# Patient Record
Sex: Female | Born: 1955
Health system: Southern US, Community
[De-identification: ages and names within clinical notes are randomized; demographics above are authoritative.]

## PROBLEM LIST (undated history)

## (undated) DIAGNOSIS — F419 Anxiety disorder, unspecified: Secondary | ICD-10-CM

## (undated) DIAGNOSIS — G43909 Migraine, unspecified, not intractable, without status migrainosus: Secondary | ICD-10-CM

## (undated) DIAGNOSIS — I10 Essential (primary) hypertension: Secondary | ICD-10-CM

## (undated) DIAGNOSIS — T7840XA Allergy, unspecified, initial encounter: Secondary | ICD-10-CM

## (undated) DIAGNOSIS — N6320 Unspecified lump in the left breast, unspecified quadrant: Secondary | ICD-10-CM

## (undated) DIAGNOSIS — E785 Hyperlipidemia, unspecified: Secondary | ICD-10-CM

## (undated) HISTORY — DX: Allergy, unspecified, initial encounter: T78.40XA

## (undated) HISTORY — DX: Essential (primary) hypertension: I10

## (undated) HISTORY — PX: TONSILLECTOMY: SUR1361

## (undated) HISTORY — DX: Hyperlipidemia, unspecified: E78.5

## (undated) HISTORY — PX: OTHER SURGICAL HISTORY: SHX169

## (undated) HISTORY — DX: Migraine, unspecified, not intractable, without status migrainosus: G43.909

---

## 1998-08-29 ENCOUNTER — Ambulatory Visit (HOSPITAL_COMMUNITY): Admission: RE | Admit: 1998-08-29 | Discharge: 1998-08-29 | Payer: Self-pay | Admitting: Otolaryngology

## 1998-08-29 ENCOUNTER — Encounter: Payer: Self-pay | Admitting: Otolaryngology

## 1999-10-18 ENCOUNTER — Other Ambulatory Visit: Admission: RE | Admit: 1999-10-18 | Discharge: 1999-10-18 | Payer: Self-pay | Admitting: Family Medicine

## 2000-03-19 ENCOUNTER — Other Ambulatory Visit: Admission: RE | Admit: 2000-03-19 | Discharge: 2000-03-19 | Payer: Self-pay | Admitting: Obstetrics & Gynecology

## 2000-10-16 ENCOUNTER — Other Ambulatory Visit: Admission: RE | Admit: 2000-10-16 | Discharge: 2000-10-16 | Payer: Self-pay | Admitting: Obstetrics & Gynecology

## 2002-03-20 ENCOUNTER — Other Ambulatory Visit: Admission: RE | Admit: 2002-03-20 | Discharge: 2002-03-20 | Payer: Self-pay | Admitting: Obstetrics & Gynecology

## 2003-03-31 ENCOUNTER — Other Ambulatory Visit: Admission: RE | Admit: 2003-03-31 | Discharge: 2003-03-31 | Payer: Self-pay | Admitting: Obstetrics & Gynecology

## 2004-05-19 ENCOUNTER — Other Ambulatory Visit: Admission: RE | Admit: 2004-05-19 | Discharge: 2004-05-19 | Payer: Self-pay | Admitting: Obstetrics & Gynecology

## 2004-07-03 ENCOUNTER — Ambulatory Visit (HOSPITAL_COMMUNITY): Admission: RE | Admit: 2004-07-03 | Discharge: 2004-07-03 | Payer: Self-pay | Admitting: Otolaryngology

## 2004-07-14 ENCOUNTER — Ambulatory Visit (HOSPITAL_COMMUNITY): Admission: RE | Admit: 2004-07-14 | Discharge: 2004-07-14 | Payer: Self-pay | Admitting: Otolaryngology

## 2004-07-14 ENCOUNTER — Encounter (INDEPENDENT_AMBULATORY_CARE_PROVIDER_SITE_OTHER): Payer: Self-pay | Admitting: *Deleted

## 2005-04-03 ENCOUNTER — Encounter: Admission: RE | Admit: 2005-04-03 | Discharge: 2005-04-03 | Payer: Self-pay | Admitting: Otolaryngology

## 2005-08-09 ENCOUNTER — Other Ambulatory Visit: Admission: RE | Admit: 2005-08-09 | Discharge: 2005-08-09 | Payer: Self-pay | Admitting: Obstetrics & Gynecology

## 2007-05-26 ENCOUNTER — Ambulatory Visit: Payer: Self-pay | Admitting: Internal Medicine

## 2007-05-26 DIAGNOSIS — J309 Allergic rhinitis, unspecified: Secondary | ICD-10-CM

## 2007-09-11 ENCOUNTER — Telehealth (INDEPENDENT_AMBULATORY_CARE_PROVIDER_SITE_OTHER): Payer: Self-pay | Admitting: *Deleted

## 2007-09-22 ENCOUNTER — Ambulatory Visit: Payer: Self-pay | Admitting: Internal Medicine

## 2007-09-22 DIAGNOSIS — G43909 Migraine, unspecified, not intractable, without status migrainosus: Secondary | ICD-10-CM | POA: Insufficient documentation

## 2007-09-22 DIAGNOSIS — I10 Essential (primary) hypertension: Secondary | ICD-10-CM | POA: Insufficient documentation

## 2007-10-09 HISTORY — PX: COLONOSCOPY: SHX174

## 2008-07-05 ENCOUNTER — Telehealth (INDEPENDENT_AMBULATORY_CARE_PROVIDER_SITE_OTHER): Payer: Self-pay | Admitting: *Deleted

## 2008-07-27 ENCOUNTER — Telehealth (INDEPENDENT_AMBULATORY_CARE_PROVIDER_SITE_OTHER): Payer: Self-pay | Admitting: *Deleted

## 2008-08-02 ENCOUNTER — Ambulatory Visit: Payer: Self-pay | Admitting: Internal Medicine

## 2008-08-03 ENCOUNTER — Encounter: Payer: Self-pay | Admitting: Internal Medicine

## 2008-08-06 ENCOUNTER — Telehealth (INDEPENDENT_AMBULATORY_CARE_PROVIDER_SITE_OTHER): Payer: Self-pay | Admitting: *Deleted

## 2008-08-13 ENCOUNTER — Ambulatory Visit: Payer: Self-pay | Admitting: Internal Medicine

## 2008-08-13 DIAGNOSIS — E785 Hyperlipidemia, unspecified: Secondary | ICD-10-CM

## 2008-08-13 LAB — CONVERTED CEMR LAB
ALT: 30 units/L (ref 0–35)
AST: 21 units/L (ref 0–37)
Albumin: 4 g/dL (ref 3.5–5.2)
BUN: 11 mg/dL (ref 6–23)
Basophils Relative: 1.1 % (ref 0.0–3.0)
CO2: 25 meq/L (ref 19–32)
Chloride: 108 meq/L (ref 96–112)
Creatinine, Ser: 0.7 mg/dL (ref 0.4–1.2)
Direct LDL: 131.1 mg/dL
Eosinophils Relative: 3.6 % (ref 0.0–5.0)
Glucose, Bld: 89 mg/dL (ref 70–99)
HDL goal, serum: 40 mg/dL
HDL: 42.8 mg/dL (ref >39.0)
Hemoglobin: 13.9 g/dL (ref 12.0–15.0)
Lymphocytes Relative: 26.3 % (ref 12.0–46.0)
Neutro Abs: 3.9 10*3/uL (ref 1.4–7.7)
Neutrophils Relative %: 61.4 % (ref 43.0–77.0)
RBC: 3.92 M/uL (ref 3.87–5.11)
Total Bilirubin: 0.9 mg/dL (ref 0.3–1.2)
Total Protein: 7.2 g/dL (ref 6.0–8.3)
VLDL: 22 mg/dL (ref 0–40)
WBC: 6.4 10*3/uL (ref 4.5–10.5)

## 2008-08-30 ENCOUNTER — Telehealth (INDEPENDENT_AMBULATORY_CARE_PROVIDER_SITE_OTHER): Payer: Self-pay | Admitting: *Deleted

## 2008-09-06 ENCOUNTER — Telehealth (INDEPENDENT_AMBULATORY_CARE_PROVIDER_SITE_OTHER): Payer: Self-pay | Admitting: *Deleted

## 2008-09-06 ENCOUNTER — Encounter: Payer: Self-pay | Admitting: Internal Medicine

## 2008-09-10 ENCOUNTER — Ambulatory Visit: Payer: Self-pay | Admitting: Internal Medicine

## 2008-09-24 ENCOUNTER — Ambulatory Visit: Payer: Self-pay | Admitting: Internal Medicine

## 2008-09-24 LAB — HM COLONOSCOPY: HM Colonoscopy: NORMAL

## 2008-11-19 ENCOUNTER — Encounter: Admission: RE | Admit: 2008-11-19 | Discharge: 2008-11-19 | Payer: Self-pay | Admitting: Obstetrics & Gynecology

## 2009-01-10 ENCOUNTER — Telehealth (INDEPENDENT_AMBULATORY_CARE_PROVIDER_SITE_OTHER): Payer: Self-pay | Admitting: *Deleted

## 2009-04-05 ENCOUNTER — Encounter: Admission: RE | Admit: 2009-04-05 | Discharge: 2009-04-05 | Payer: Self-pay | Admitting: Obstetrics & Gynecology

## 2009-08-15 ENCOUNTER — Ambulatory Visit: Payer: Self-pay | Admitting: Internal Medicine

## 2009-08-15 DIAGNOSIS — R5383 Other fatigue: Secondary | ICD-10-CM

## 2009-08-15 DIAGNOSIS — R5381 Other malaise: Secondary | ICD-10-CM

## 2009-08-15 DIAGNOSIS — J45909 Unspecified asthma, uncomplicated: Secondary | ICD-10-CM

## 2009-12-05 ENCOUNTER — Ambulatory Visit: Payer: Self-pay | Admitting: Internal Medicine

## 2009-12-19 ENCOUNTER — Ambulatory Visit: Payer: Self-pay | Admitting: Family

## 2009-12-23 ENCOUNTER — Telehealth: Payer: Self-pay | Admitting: Family

## 2009-12-23 ENCOUNTER — Encounter (INDEPENDENT_AMBULATORY_CARE_PROVIDER_SITE_OTHER): Payer: Self-pay | Admitting: *Deleted

## 2010-04-19 ENCOUNTER — Encounter: Admission: RE | Admit: 2010-04-19 | Discharge: 2010-04-19 | Payer: Self-pay | Admitting: Obstetrics & Gynecology

## 2010-09-14 ENCOUNTER — Telehealth (INDEPENDENT_AMBULATORY_CARE_PROVIDER_SITE_OTHER): Payer: Self-pay | Admitting: *Deleted

## 2010-10-04 ENCOUNTER — Ambulatory Visit: Payer: Self-pay | Admitting: Internal Medicine

## 2010-10-05 ENCOUNTER — Encounter: Payer: Self-pay | Admitting: Internal Medicine

## 2010-10-05 LAB — CONVERTED CEMR LAB: BUN: 14 mg/dL (ref 6–23)

## 2010-11-07 NOTE — Assessment & Plan Note (Signed)
Summary: BURNED RT HAND ON WOOD STOVE/RH.....   Vital Signs:  Patient profile:   55 year old female Height:      61.5 inches Weight:      133 pounds Temp:     99 degrees F BP sitting:   110 / 80  Vitals Entered By: [name] (December 05, 2009 4:27 PM) CC: burn on rt hand 2 weeks ago on wood stove, Is Patient Diabetic? No   History of Present Illness: as above burn is healing but still hurt some, patient not sure if needs to do something else   Current Medications (verified): 1)  Nasonex 50 Mcg/act  Susp (Mometasone Furoate) .... Use As Directed 2)  Estradiol-Norethindrone Acet 1-0.5 Mg Tabs (Estradiol-Norethindrone Acet) .Marland Kitchen.. 1 By Mouth Once Daily 3)  Cozaar 100 Mg Tabs (Losartan Potassium) .Marland Kitchen.. 1 By Mouth Once Daily 4)  Gabapentin 100 Mg Caps (Gabapentin) .Marland Kitchen.. 1 -2 Q 8 Hrs Prn 5)  Zyrtec-D Allergy & Congestion 5-120 Mg Xr12h-Tab (Cetirizine-Pseudoephedrine) .... 1/2 By Mouth Once Daily 6)  Alvesco 160 Mcg/act Aers (Ciclesonide) .... Once Daily  Allergies (verified): 1)  Sulfa  Past History:  Past Medical History: Reviewed history from 08/15/2009 and no changes required. Allergic rhinitis Hypertension G 0 P0 ; Migraines Hyperlipidemia: LDL 131 in 2009 Asthma/ RAD  variant as cough  Past Surgical History: Reviewed history from 08/15/2009 and no changes required. Tonsillectomy Thyroid cyst aspirated; Colonoscopy negative ,due 2019  Review of Systems       no fever, no d/c  Physical Exam  General:  alert and well-developed.   Extremities:  dorsum of the R hand has a oval, 1x2 cn wound, margins are healing well, center slt red w/o d/c    Impression & Recommendations:  Problem # 1:  BURN, HAND (ICD-944.00)  keep clean and cover when she goes to work put hydrocortisone OTC at night call if no better soon  Complete Medication List: 1)  Nasonex 50 Mcg/act Susp (Mometasone furoate) .... Use as directed 2)  Estradiol-norethindrone Acet 1-0.5 Mg Tabs  (Estradiol-norethindrone acet) .Marland Kitchen.. 1 by mouth once daily 3)  Cozaar 100 Mg Tabs (Losartan potassium) .Marland Kitchen.. 1 by mouth once daily 4)  Gabapentin 100 Mg Caps (Gabapentin) .Marland Kitchen.. 1 -2 q 8 hrs prn 5)  Zyrtec-d Allergy & Congestion 5-120 Mg Xr12h-tab (Cetirizine-pseudoephedrine) .... 1/2 by mouth once daily 6)  Alvesco 160 Mcg/act Aers (Ciclesonide) .... Once daily

## 2010-11-07 NOTE — Miscellaneous (Signed)
  Clinical Lists Changes  Allergies: Added new allergy or adverse reaction of CODEINE     Current Allergies: ! CODEINE SULFA

## 2010-11-07 NOTE — Assessment & Plan Note (Signed)
Summary: bad congestion//lch   Vital Signs:  Patient profile:   55 year old female Weight:      131.50 pounds BMI:     24.53 O2 Sat:      96 % on Room air Temp:     98.1 degrees F oral Pulse rate:   83 / minute Pulse rhythm:   regular Resp:     12 per minute BP sitting:   114 / 78  (right arm) Cuff size:   regular  Vitals Entered By: Mervin Kung CMA (December 19, 2009 4:24 PM)  O2 Flow:  Room air \\CC : room 4  Head congestion since Thursday.  Has productive green cough in the a.m.  Right ear pain and fullness x 2 days., Lipid Management   CC:  room 4  Head congestion since Thursday.  Has productive green cough in the a.m.  Right ear pain and fullness x 2 days. and Lipid Management.  History of Present Illness: Samantha Shepard is a 55 year old female who presents today with c/o "cold" now involving ears/chest/voice hoarseness.  Also has some associated sinus and "tooth" pain.  + cough.  Both sinus d/c and phlegm range from yellow to clear in color.  Notes that these symptoms started on Thursday, thinks she may have had some mild subjective fever.  Has used mucinex- DM, which has not helped her symptoms.  Trouble sleeping at night due to cough.  + sick contacts at work.    Lipid Management History:      Positive NCEP/ATP III risk factors include female age 105 years old or older and hypertension.  Negative NCEP/ATP III risk factors include non-tobacco-user status.     Allergies: 1)  Sulfa  Physical Exam  General:  Well-developed,well-nourished,in no acute distress; alert,appropriate and cooperative throughout examination Head:  Normocephalic and atraumatic without obvious abnormalities. No apparent alopecia or balding. Eyes:  PERRLA Ears:  serous effusion right ear. Mouth:  Oral mucosa and oropharynx without lesions or exudates.  Teeth in good repair. Neck:  No deformities, masses, or tenderness noted. Lungs:  Normal respiratory effort, chest expands symmetrically. Lungs are clear  to auscultation, no crackles or wheezes. Heart:  Normal rate and regular rhythm. S1 and S2 normal without gallop, murmur, click, rub or other extra sounds.   Impression & Recommendations:  Problem # 1:  SINUSITIS (ICD-473.9) Will plan to treat sinus and mild bronchitis with amoxicillin. Add Tussionex as needed for cough.   Her updated medication list for this problem includes:    Nasonex 50 Mcg/act Susp (Mometasone furoate) ..... Use as directed    Zyrtec-d Allergy & Congestion 5-120 Mg Xr12h-tab (Cetirizine-pseudoephedrine) .Marland Kitchen... 1/2 by mouth once daily    Mucinex Dm 30-600 Mg Xr12h-tab (Dextromethorphan-guaifenesin) .Marland Kitchen... Take 1 tablet by mouth two times a day    Amoxicillin 500 Mg Cap (Amoxicillin) .Marland Kitchen... Take 1 capsule by mouth three times a day x 10 days    Tussionex Pennkinetic Er 8-10 Mg/36ml Lqcr (Chlorpheniramine-hydrocodone) .Marland KitchenMarland KitchenMarland KitchenMarland Kitchen 5 ml by mouth every 12 hours as needed for cough.  Problem # 2:  BRONCHITIS, MILD (ICD-490)  Her updated medication list for this problem includes:    Zyrtec-d Allergy & Congestion 5-120 Mg Xr12h-tab (Cetirizine-pseudoephedrine) .Marland Kitchen... 1/2 by mouth once daily    Alvesco 160 Mcg/act Aers (Ciclesonide) .Marland Kitchen... As needed    Mucinex Dm 30-600 Mg Xr12h-tab (Dextromethorphan-guaifenesin) .Marland Kitchen... Take 1 tablet by mouth two times a day    Amoxicillin 500 Mg Cap (Amoxicillin) .Marland Kitchen... Take 1 capsule by  mouth three times a day x 10 days    Tussionex Pennkinetic Er 8-10 Mg/72ml Lqcr (Chlorpheniramine-hydrocodone) .Marland KitchenMarland KitchenMarland KitchenMarland Kitchen 5 ml by mouth every 12 hours as needed for cough.  Complete Medication List: 1)  Nasonex 50 Mcg/act Susp (Mometasone furoate) .... Use as directed 2)  Estradiol-norethindrone Acet 1-0.5 Mg Tabs (Estradiol-norethindrone acet) .Marland Kitchen.. 1 by mouth every other day. 3)  Cozaar 100 Mg Tabs (Losartan potassium) .Marland Kitchen.. 1 by mouth once daily 4)  Gabapentin 100 Mg Caps (Gabapentin) .Marland Kitchen.. 1 -2 q 8 hrs prn 5)  Zyrtec-d Allergy & Congestion 5-120 Mg Xr12h-tab  (Cetirizine-pseudoephedrine) .... 1/2 by mouth once daily 6)  Alvesco 160 Mcg/act Aers (Ciclesonide) .... As needed 7)  Mucinex Dm 30-600 Mg Xr12h-tab (Dextromethorphan-guaifenesin) .... Take 1 tablet by mouth two times a day 8)  Amoxicillin 500 Mg Cap (Amoxicillin) .... Take 1 capsule by mouth three times a day x 10 days 9)  Tussionex Pennkinetic Er 8-10 Mg/37ml Lqcr (Chlorpheniramine-hydrocodone) .... 5 ml by mouth every 12 hours as needed for cough.  Lipid Assessment/Plan:      Based on NCEP/ATP III, the patient's risk factor category is "2 or more risk factors and a calculated 10 year CAD risk of > 20%".  The patient's lipid goals are as follows: Total cholesterol goal is 200; LDL cholesterol goal is 160; HDL cholesterol goal is 40; Triglyceride goal is 150.    Patient Instructions: 1)  Call if you develop fever over 101, increasing sinus pressure, pain with eye movement, increased facial tenderness of swelling, worsening cough, or if you develop visual changes. Prescriptions: TUSSIONEX PENNKINETIC ER 8-10 MG/5ML LQCR (CHLORPHENIRAMINE-HYDROCODONE) 5 ml by mouth every 12 hours as needed for cough.  #150 x 0   Entered and Authorized by:   Lemont Fillers FNP   Signed by:   Lemont Fillers FNP on 12/19/2009   Method used:   Print then Give to Patient   RxID:   [id number] AMOXICILLIN 500 MG CAP (AMOXICILLIN) Take 1 capsule by mouth three times a day X 10 days  #30 x 0   Entered and Authorized by:   Lemont Fillers FNP   Signed by:   Lemont Fillers FNP on 12/19/2009   Method used:   Electronically to        [facility] (retail)       [address]       [place], [place]  [address]       Ph: [phone] or [phone]       Fax: [phone]   RxID:   [id number]   Current Allergies (reviewed today): SULFA

## 2010-11-07 NOTE — Progress Notes (Signed)
Summary: Probs with cough med.  Phone Note Call from Patient   Caller: Patient Details for Reason: Medication  for cough Summary of Call: Patient was seen   Monday given rx  for tussionex for her cough , pt can not take tussionex keeps her up all night .  Please call pt   Cell  [phone]    Initial call taken by: Darral Dash,  December 23, 2009 8:17 AM  Follow-up for Phone Call        Pls call patient and if aggreeable switch to: cheratussin 1-2 teaspoons by mouth every 6 hours as needed, #237ml, 0 refills  Otherwise she should use Robitussin Follow-up by: Lemont Fillers FNP,  December 23, 2009 9:05 AM  Additional Follow-up for Phone Call Additional follow up Details #1::        Pt states she thinks the codeine is keeping her awake. She states she would just take the Robitussin. Additional Follow-up by: Mervin Kung CMA,  December 23, 2009 9:21 AM

## 2010-11-07 NOTE — Progress Notes (Signed)
Summary: Losartan refill  Phone Note Refill Request Message from:  Fax from Pharmacy on September 14, 2010 12:27 PM  Refills Requested: Medication #1:  COZAAR 100 MG TABS 1 by mouth once daily   Last Refilled: 03/06/2010 [id number], [address], [place], [place]   phone-[phone]   [phone]   qty = 90  Next Appointment Scheduled: Wed 10/04/2010 Initial call taken by: Jerolyn Shin,  September 14, 2010 12:28 PM    Prescriptions: COZAAR 100 MG TABS (LOSARTAN POTASSIUM) 1 by mouth once daily  #90 x 0   Entered by:   Shonna Chock CMA   Authorized by:   Marga Melnick MD   Signed by:   Shonna Chock CMA on 09/14/2010   Method used:   Electronically to        [facility] (retail)       [address]       [place], [place]  [address]       Ph: [phone] or [phone]       Fax: [phone]   RxID:   [id number]

## 2010-11-09 NOTE — Letter (Signed)
Summary: [facility]  [facility]   Imported By: Lanelle Bal 10/26/2010 11:32:26  _____________________________________________________________________  External Attachment:    Type:   Image     Comment:   External Document

## 2010-11-09 NOTE — Assessment & Plan Note (Signed)
Summary: med refill//kn   Vital Signs:  Patient profile:   55 year old female Height:      62.25 inches Weight:      133.6 pounds BMI:     24.33 Temp:     98.4 degrees F oral Pulse rate:   72 / minute Resp:     14 per minute BP sitting:   124 / 80  (left arm) Cuff size:   regular  Vitals Entered By: Shonna Chock CMA (October 04, 2010 11:05 AM) CC: Refill B/P med, patient states " Not a physical", Lipid Management Comments Height recorded with shoes on Patient informed TD vaccine due   CC:  Refill B/P med, patient states " Not a physical", and Lipid Management.  History of Present Illness: Hypertension Follow-Up      This is a 55 year old woman who presents for Hypertension follow-up.  The patient denies lightheadedness, urinary frequency, headaches, edema, and  significant fatigue.  The patient denies the following associated symptoms: chest pain, chest pressure, exercise intolerance, dyspnea, palpitations, and syncope.  Compliance with medications (by patient report) has been near 100%.  The patient reports that dietary compliance has been good.  The patient reports exercising 2-3 X per week.  Adjunctive measures currently used by the patient include modified  salt restriction.  BP averages < 135/85 .  Lipid Management History:      Positive NCEP/ATP III risk factors include female age 57 years old or older and hypertension.  Negative NCEP/ATP III risk factors include no history of early menopause without estrogen hormone replacement, non-diabetic, no family history for ischemic heart disease, non-tobacco-user status, no ASHD (atherosclerotic heart disease), no prior stroke/TIA, no peripheral vascular disease, and no history of aortic aneurysm.     Current Medications (verified): 1)  Estradiol-Norethindrone Acet 1-0.5 Mg Tabs (Estradiol-Norethindrone Acet) .Marland Kitchen.. 1 By Mouth Every Other Day. 2)  Cozaar 100 Mg Tabs (Losartan Potassium) .Marland Kitchen.. 1 By Mouth Once Daily 3)  Gabapentin 100 Mg  Caps (Gabapentin) .Marland Kitchen.. 1 -2 Q 8 Hrs Prn 4)  Zyrtec-D Allergy & Congestion 5-120 Mg Xr12h-Tab (Cetirizine-Pseudoephedrine) .... 1/2 By Mouth Once Daily  Allergies: 1)  ! Codeine 2)  Sulfa  Past History:  Past Medical History: Allergic rhinitis Hypertension G 0 P0 ; Migraines Hyperlipidemia: LDL 131, HDL 43  in 2009.[place] Study LDL goal = < 130. No FH of premature CAD/MI Asthma/ RAD  variant as cough  Past Surgical History: Tonsillectomy Thyroid cyst aspirated; Colonoscopy negative 2009 ,due 2019  Review of Systems Allergy:  Complains of itching eyes and sneezing; Nasonex Rxed by Dr Beaulah Dinning .  Physical Exam  General:  well-nourished;alert,appropriate and cooperative throughout examination Lungs:  Normal respiratory effort, chest expands symmetrically. Lungs are clear to auscultation, no crackles or wheezes. Heart:  Normal rate and regular rhythm. S1 and S2 normal without gallop, murmur, click, rub.S4 with  slight slurring Abdomen:  Bowel sounds positive,abdomen soft and non-tender without masses, organomegaly or hernias noted. No AAA or bruits Pulses:  R and L carotid,radial,dorsalis pedis and posterior tibial pulses are full and equal bilaterally Extremities:  No clubbing, cyanosis, edema. Psych:  memory intact for recent and remote, normally interactive, and good eye contact.     Impression & Recommendations:  Problem # 1:  HYPERTENSION, ESSENTIAL NOS (ICD-401.9)  controlled Her updated medication list for this problem includes:    Cozaar 100 Mg Tabs (Losartan potassium) .Marland Kitchen... 1 by mouth once daily  Orders: Venipuncture (16109) TLB-Creatinine, Blood (82565-CREA) TLB-Potassium (K+) (  84132-K) TLB-BUN (Urea Nitrogen) (84520-BUN)  Problem # 2:  HYPERLIPIDEMIA (ICD-272.4) LDL goal = < 130  Complete Medication List: 1)  Estradiol-norethindrone Acet 1-0.5 Mg Tabs (Estradiol-norethindrone acet) .Marland Kitchen.. 1 by mouth every other day. 2)  Cozaar 100 Mg Tabs (Losartan  potassium) .Marland Kitchen.. 1 by mouth once daily 3)  Gabapentin 100 Mg Caps (Gabapentin) .Marland Kitchen.. 1 -2 q 8 hrs prn 4)  Zyrtec-d Allergy & Congestion 5-120 Mg Xr12h-tab (Cetirizine-pseudoephedrine) .... 1/2 by mouth once daily  Lipid Assessment/Plan:      Based on NCEP/ATP III, the patient's risk factor category is "2 or more risk factors and a calculated 10 year CAD risk of > 20%".  The patient's lipid goals are as follows: Total cholesterol goal is 200; LDL cholesterol goal is 160; HDL cholesterol goal is 40; Triglyceride goal is 150.    Patient Instructions: 1)  Consider monitor of fasting lipids; LDL goal = < 130. 2)  It is important that you exercise regularly at least 20 minutes 5 times a week. If you develop chest pain, have severe difficulty breathing, or feel very tired , stop exercising immediately and seek medical attention. 3)  Take an  57 mg coated Aspirin every day. 4)  Check your Blood Pressure regularly. If it is above: < 135/85 ON AVERAGE  you should make an appointment. Avoid decongestants. Prescriptions: COZAAR 100 MG TABS (LOSARTAN POTASSIUM) 1 by mouth once daily  #90 x 3   Entered and Authorized by:   Marga Melnick MD   Signed by:   Marga Melnick MD on 10/04/2010   Method used:   Print then Give to Patient   RxID:   [id number]    Orders Added: 1)  Est. Patient Level III [14782] 2)  Venipuncture [95621] 3)  TLB-Creatinine, Blood [82565-CREA] 4)  TLB-Potassium (K+) [84132-K] 5)  TLB-BUN (Urea Nitrogen) [84520-BUN]

## 2010-12-01 ENCOUNTER — Encounter: Payer: Self-pay | Admitting: Internal Medicine

## 2010-12-01 ENCOUNTER — Ambulatory Visit (INDEPENDENT_AMBULATORY_CARE_PROVIDER_SITE_OTHER): Payer: BC Managed Care – PPO | Admitting: Internal Medicine

## 2010-12-01 DIAGNOSIS — J069 Acute upper respiratory infection, unspecified: Secondary | ICD-10-CM

## 2010-12-01 DIAGNOSIS — R05 Cough: Secondary | ICD-10-CM

## 2010-12-01 DIAGNOSIS — J309 Allergic rhinitis, unspecified: Secondary | ICD-10-CM

## 2010-12-05 NOTE — Assessment & Plan Note (Signed)
Summary: SINUS PAIN/RH.....   Vital Signs:  Patient profile:   55 year old female Weight:      134.8 pounds BMI:     24.55 Temp:     98.6 degrees F oral Pulse rate:   80 / minute Resp:     15 per minute BP sitting:   122 / 70  (left arm) Cuff size:   large  Vitals Entered By: Shonna Chock CMA (December 01, 2010 4:36 PM) CC: Sinus pain, URI symptoms   CC:  Sinus pain and URI symptoms.  History of Present Illness:    Onset 11/21/2010 as dizziness  & malaise; she does not take the Flu shot. She now  reports nasal congestion, dry cough, and R  earache, but denies purulent nasal discharge.  Associated symptoms include dyspnea.  The patient denies fever and wheezing.  The patient also reports frontal  headache.  Risk factors for Strep sinusitis include bilateral facial pain and tooth pain.  The patient denies the following risk factors for Strep sinusitis: tender adenopathy.   Rx: Nasonex, Asmanex   Current Medications (verified): 1)  Cozaar 100 Mg Tabs (Losartan Potassium) .Marland Kitchen.. 1 By Mouth Once Daily 2)  Gabapentin 100 Mg Caps (Gabapentin) .Marland Kitchen.. 1 -2 Q 8 Hrs Prn 3)  Zyrtec Allergy 10 Mg Tabs (Cetirizine Hcl) .Marland Kitchen.. 1 By Mouth Once Daily 4)  Asmanex 60 Metered Doses 220 Mcg/inh Aepb (Mometasone Furoate) .... Once Daily 5)  Nasonex 50 Mcg/act Susp (Mometasone Furoate) .... As Directed  Allergies: 1)  ! Codeine 2)  Sulfa  Review of Systems Allergy:  Complains of itching eyes; denies sneezing.  Physical Exam  General:  well-nourished,in no acute distress; alert,appropriate and cooperative throughout examination Ears:  External ear exam shows no significant lesions or deformities.  Otoscopic examination reveals clear canals, tympanic membranes are intact bilaterally without bulging, retraction, inflammation or discharge. Hearing is grossly normal bilaterally. Nose:  External nasal examination shows no deformity or inflammation. Nasal mucosa are  dry  without lesions or exudates. Mouth:   Oral mucosa and oropharynx without lesions or exudates.  Teeth in good repair. No pharyngeal erythema.   Lungs:  Normal respiratory effort, chest expands symmetrically. Lungs are clear to auscultation, no crackles or wheezes. Cervical Nodes:  No lymphadenopathy noted Axillary Nodes:  No palpable lymphadenopathy   Impression & Recommendations:  Problem # 1:  URI (ICD-465.9) Extrinsic (allergic)  vs subclinical deep seated rhinosinusitis  Her updated medication list for this problem includes:    Zyrtec Allergy 10 Mg Tabs (Cetirizine hcl) .Marland Kitchen... 1 by mouth once daily  Problem # 2:  COUGH (ICD-786.2) extrinsic   Problem # 3:  ALLERGIC RHINITIS (ICD-477.9) PMH of  Her updated medication list for this problem includes:    Zyrtec Allergy 10 Mg Tabs (Cetirizine hcl) .Marland Kitchen... 1 by mouth once daily    Nasonex 50 Mcg/act Susp (Mometasone furoate) .Marland Kitchen... As directed  Complete Medication List: 1)  Cozaar 100 Mg Tabs (Losartan potassium) .Marland Kitchen.. 1 by mouth once daily 2)  Gabapentin 100 Mg Caps (Gabapentin) .Marland Kitchen.. 1 -2 q 8 hrs prn 3)  Zyrtec Allergy 10 Mg Tabs (Cetirizine hcl) .Marland Kitchen.. 1 by mouth once daily 4)  Asmanex 60 Metered Doses 220 Mcg/inh Aepb (Mometasone furoate) .... Once daily 5)  Nasonex 50 Mcg/act Susp (Mometasone furoate) .... As directed 6)  Singulair 10 Mg Tabs (Montelukast sodium) .Marland Kitchen.. 1 once daily 7)  Amoxicillin 500 Mg Caps (Amoxicillin) .Marland Kitchen.. 1 three times a day  Patient Instructions: 1)  Neti pot once daily two times a day as needed followed by Nasonex as crossover technique.  Fill the Amoxicillin Rx if you have "pain , pus , & fever"  Prescriptions: AMOXICILLIN 500 MG CAPS (AMOXICILLIN) 1 three times a day  #30 x 0   Entered and Authorized by:   Marga Melnick MD   Signed by:   Marga Melnick MD on 12/01/2010   Method used:   Print then Give to Patient   RxID:   [id number] SINGULAIR 10 MG TABS (MONTELUKAST SODIUM) 1 once daily  #30 x 5   Entered and Authorized by:   Marga Melnick MD   Signed by:   Marga Melnick MD on 12/01/2010   Method used:   Print then Give to Patient   RxID:   [id number]    Orders Added: 1)  Est. Patient Level III [02725]

## 2011-04-04 ENCOUNTER — Ambulatory Visit (INDEPENDENT_AMBULATORY_CARE_PROVIDER_SITE_OTHER): Payer: BC Managed Care – PPO | Admitting: Internal Medicine

## 2011-04-04 ENCOUNTER — Encounter: Payer: Self-pay | Admitting: Internal Medicine

## 2011-04-04 DIAGNOSIS — R5381 Other malaise: Secondary | ICD-10-CM

## 2011-04-04 DIAGNOSIS — J209 Acute bronchitis, unspecified: Secondary | ICD-10-CM

## 2011-04-04 DIAGNOSIS — R509 Fever, unspecified: Secondary | ICD-10-CM

## 2011-04-04 LAB — CBC WITH DIFFERENTIAL/PLATELET
Basophils Absolute: 0 10*3/uL (ref 0.0–0.1)
Hemoglobin: 12.5 g/dL (ref 12.0–15.0)
Lymphocytes Relative: 12.3 % (ref 12.0–46.0)
Monocytes Relative: 10.3 % (ref 3.0–12.0)
Neutro Abs: 6.7 10*3/uL (ref 1.4–7.7)
RDW: 12.5 % (ref 11.5–14.6)
WBC: 8.7 10*3/uL (ref 4.5–10.5)

## 2011-04-04 MED ORDER — AZITHROMYCIN 250 MG PO TABS: ORAL_TABLET | ORAL | Status: AC

## 2011-04-04 MED ORDER — BENZONATATE 200 MG PO CAPS: 200.0000 mg | ORAL_CAPSULE | Freq: Three times a day (TID) | ORAL | Status: AC | PRN

## 2011-04-04 NOTE — Progress Notes (Signed)
  Subjective:    Patient ID: Samantha Shepard, female    DOB: [birth year], 55 y.o.   MRN: [id number]  HPI Respiratory tract infection Onset/symptoms:6/24 as malaise, fever to 101 & NP cough Exposures (illness/environmental/extrinsic):no Progression of symptoms:waxing symptoms but recurrence 6/26 as higher   Fever, up to 102 Treatments/response:aleve, fluids with benefit 6/22 only Present symptoms:arthralgias/ myalgias Fever/chills/sweats:intermittent Frontal headache:no but over crown Facial pain:no Nasal purulence:no Sore throat:no Dental pain:yes Lymphadenopathy:no Wheezing/shortness of breath:some SOB Cough/sputum/hemoptysis:scant sputum Pleuritic pain:6/24 only Associated extrinsic/allergic symptoms:itchy eyes/ sneezing:no Past medical history: Seasonal allergies:yes;asthma:yes. She has seen Dr Beaulah Dinning Smoking history:never           Review of Systems No known tick exposure     Objective:   Physical Exam General appearance is of good health and nourishment; no acute distress or increased work of breathing is present.  No  lymphadenopathy about the head, neck, or axilla noted.   Eyes: No conjunctival inflammation or lid edema is present. There is no scleral icterus.  Ears:  External ear exam shows no significant lesions or deformities.  Otoscopic examination reveals clear canals, tympanic membranes are intact bilaterally without bulging, retraction, inflammation or discharge.  Nose:  External nasal examination shows no deformity or inflammation. Nasal mucosa are pink and moist without lesions or exudates. No septal dislocation or dislocation.No obstruction to airflow.   Oral exam: Dental hygiene is good; lips and gums are healthy appearing.There is no oropharyngeal erythema or exudate noted.   Neck:  No deformities, thyromegaly, masses, or tenderness noted.   Supple with full range of motion without pain.   Heart:  Normal rate and regular rhythm. S1 and S2 normal  without gallop, murmur, click, rub or other extra sounds.   Lungs:Chest clear to auscultation; no wheezes, rhonchi,rales ,or rubs present.No increased work of breathing. Dry cough   Abdomen: bowel sounds normal, soft and non-tender without masses, organomegaly or hernias noted.  No guarding or rebound     Extremities:  No cyanosis, edema, or clubbing  noted . Neg SLR   Skin: Warm & dry w/o jaundice or tenting.          Assessment & Plan:  #1bronchitis #79fever # malaise Plan: see Orders

## 2011-04-04 NOTE — Patient Instructions (Signed)
Plain Mucinex for thick secretions ;force NON dairy fluids for next 48 hrs. Use a Neti pot daily as needed for sinus congestion .NSAIDS ( Aleve, Advil, Naproxen) or Tylenol every 4 hrs as needed for fever as discussed based on label recommendations

## 2011-04-16 ENCOUNTER — Telehealth: Payer: Self-pay | Admitting: Internal Medicine

## 2011-04-16 MED ORDER — PREDNISONE 10 MG PO TABS: ORAL_TABLET | ORAL | Status: DC

## 2011-04-16 NOTE — Telephone Encounter (Signed)
Pt aware rx to be sent to pharmacy. 

## 2011-04-16 NOTE — Telephone Encounter (Signed)
Prednisone 12 mg 1 3:30 times a day with food dispense 12 no refill

## 2011-04-16 NOTE — Telephone Encounter (Signed)
Pt called was seen 04/04/11 dx w/ bronchitis says she still doesn't feel much better and that in the past has had to get rx for prednisone to feel completely better when she would see allergist. Pls advise.

## 2011-05-09 ENCOUNTER — Other Ambulatory Visit: Payer: Self-pay | Admitting: Obstetrics & Gynecology

## 2011-05-09 DIAGNOSIS — Z1231 Encounter for screening mammogram for malignant neoplasm of breast: Secondary | ICD-10-CM

## 2011-05-15 ENCOUNTER — Ambulatory Visit: Payer: BC Managed Care – PPO

## 2011-05-16 ENCOUNTER — Ambulatory Visit
Admission: RE | Admit: 2011-05-16 | Discharge: 2011-05-16 | Disposition: A | Discharge status: Home / Self Care | Payer: BC Managed Care – PPO | Source: Ambulatory Visit | Attending: Obstetrics & Gynecology | Admitting: Obstetrics & Gynecology

## 2011-05-16 DIAGNOSIS — Z1231 Encounter for screening mammogram for malignant neoplasm of breast: Secondary | ICD-10-CM

## 2011-06-04 ENCOUNTER — Other Ambulatory Visit: Payer: Self-pay | Admitting: Internal Medicine

## 2011-09-04 ENCOUNTER — Telehealth: Payer: Self-pay | Admitting: *Deleted

## 2011-09-04 NOTE — Telephone Encounter (Signed)
Pt advised that she has been having chest pain and heart racing for a month now. I advised per verbal order from MD lowne to go to ER or any facility that can assist with an EKG per pt only wants to see [facility]. Pt advised that she has had this before and is not concerned with this enough to go to ER. Set up an appt at 4pm for Friday 09-07-11 per earliest afternoon apt noted.

## 2011-09-05 ENCOUNTER — Encounter: Payer: Self-pay | Admitting: Internal Medicine

## 2011-09-07 ENCOUNTER — Ambulatory Visit (INDEPENDENT_AMBULATORY_CARE_PROVIDER_SITE_OTHER): Payer: BC Managed Care – PPO | Admitting: Internal Medicine

## 2011-09-07 ENCOUNTER — Encounter: Payer: Self-pay | Admitting: Internal Medicine

## 2011-09-07 DIAGNOSIS — R079 Chest pain, unspecified: Secondary | ICD-10-CM

## 2011-09-07 DIAGNOSIS — R002 Palpitations: Secondary | ICD-10-CM

## 2011-09-07 LAB — BASIC METABOLIC PANEL
CO2: 24 mEq/L (ref 19–32)
Chloride: 104 mEq/L (ref 96–112)
Creat: 0.74 mg/dL (ref 0.50–1.10)
Sodium: 138 mEq/L (ref 135–145)

## 2011-09-07 LAB — MAGNESIUM: Magnesium: 2.2 mg/dL (ref 1.5–2.5)

## 2011-09-07 MED ORDER — METOPROLOL TARTRATE 25 MG PO TABS: 25.0000 mg | ORAL_TABLET | Freq: Two times a day (BID) | ORAL | Status: DC

## 2011-09-07 NOTE — Patient Instructions (Addendum)
To prevent palpitations or premature beats, avoid stimulants such as decongestants, diet pills, nicotine, or caffeine (coffee, tea, cola, or chocolate) to excess. If symptoms persist or progress; a  Stress ECHOcardiogram  Test will be scheduled.

## 2011-09-07 NOTE — Progress Notes (Signed)
  Subjective:    Patient ID: Samantha Shepard, female    DOB: [birth year], 56 y.o.   MRN: [id number]  HPI CHEST PAIN: Location: LSB   Quality: sharp  Duration: seconds  Onset : 1 month ago Trigger:? Stress;  ? Pulled muscle @ gym Radiation: no; rarely to intrascapular area  Better with: no relievers tried  Worse with: no exacerbating factors  Symptoms History of Trauma/lifting: no, but machines used machines up to 3 weeks ago  Nausea/vomiting: no  Diaphoresis: no  Shortness of breath: some  Pleuritic: no  Cough: chronic due to cat allergy; she has 2 Edema: no  PND: no  Dizziness: paroxysmal unrelated to cp  Palpitations: yes, with or w/o cp  Syncope: no  Indigestion: occasionally; no dysphagia   Red Flags Worse with exertion: yes, Ex. Walking up steep driveway  Recent Immobility: no  Tearing/radiation to back: yes, occasionally           Review of Systems her asthma has been quiescent, but her allergies have flared. She does take Zyrtec-D occasionally. She denies increased intake of caffeine. Sleeping poorly     Objective:   Physical Exam  Gen.: well-nourished; in no acute distress Eyes: Extraocular motion intact; no lid lag or proptosis Neck: full ROM; thyroid normal Heart: Normal rhythm and rate without significant  Gallop. Grade 1/6 systolic murmur. Occasional premature Lungs: Chest clear to auscultation without rales,rales, wheezes Abdomen: bowel sounds normal, soft and non-tender without masses, organomegaly or hernias. No AAA or bruits noted.  No guarding or rebound  Neuro:Deep tendon reflexes are equal and within normal limits; no significant  Tremor  All pulses intact without  bruits .No ischemic skin changes.  Plethora of hands. Homan's negative  Skin: Warm and dry without significant lesions or rashes; no onycholysis Psych: Normally communicative and interactive; no abnormal mood or affect clinically.         Assessment & Plan:  #1 atypical chest  pain; no ischemic changes on EKG.  #2 palpitations with PACs on EKG. She does take a decongestant intermittently  #3 family history coronary disease, not premature  Plan: See orders and recommendations.

## 2012-02-25 ENCOUNTER — Other Ambulatory Visit: Payer: Self-pay | Admitting: Internal Medicine

## 2012-03-14 ENCOUNTER — Ambulatory Visit (INDEPENDENT_AMBULATORY_CARE_PROVIDER_SITE_OTHER): Payer: BC Managed Care – PPO | Admitting: Internal Medicine

## 2012-03-14 VITALS — BP 122/78 | HR 78 | Temp 98.5°F | Wt 133.0 lb

## 2012-03-14 DIAGNOSIS — J309 Allergic rhinitis, unspecified: Secondary | ICD-10-CM

## 2012-03-14 DIAGNOSIS — J45909 Unspecified asthma, uncomplicated: Secondary | ICD-10-CM

## 2012-03-14 MED ORDER — PREDNISONE 10 MG PO TABS: ORAL_TABLET | ORAL | Status: DC

## 2012-03-14 MED ORDER — AZELASTINE HCL 0.1 % NA SOLN: 2.0000 | Freq: Two times a day (BID) | NASAL | Status: DC

## 2012-03-14 MED ORDER — MONTELUKAST SODIUM 10 MG PO TABS: 10.0000 mg | ORAL_TABLET | Freq: Every day | ORAL | Status: DC

## 2012-03-14 NOTE — Assessment & Plan Note (Signed)
Symptoms well-controlled, takes asmanex as needed

## 2012-03-14 NOTE — Patient Instructions (Signed)
Prednisone for a few days Add Astelin, at least temporarily I recommend plain Zyrtec, not Zyrtec-D Restart Singulair Call if no better in few days

## 2012-03-14 NOTE — Assessment & Plan Note (Addendum)
Sore throat likely due to allergic rhinitis. I like to check a strep test but she declined. Plan:  Prednisone Add Astelin temporarily. Call if not better Recommend to restart Singulair

## 2012-03-14 NOTE — Progress Notes (Signed)
  Subjective:    Patient ID: Samantha Shepard, female    DOB: [birth year], 56 y.o.   MRN: [id number]  HPI Acute visit One-week history of sore throat, it feels like burning. Also dry cough and hoarseness. Patient states is likely due to to allergies  Past Medical History  Diagnosis Date  . Allergy     dust, cats  . Hypertension   . Asthma   . Hyperlipidemia   . Migraines      Review of Systems No fever or chills Chronic postnasal dripping worse in the last few days. no wheezing, does not feel her asthma is getting worse. She takes asmanex as needed. Good compliance with Nasonex and Zyrtec-D, not taking singular. No shortness of breath, chest pain No nausea, vomiting, diarrhea    Objective:   Physical Exam  General -- alert, well-developed, and well-nourished.   HEENT -- TMs normal, throat w/o redness, face symmetric and not tender to palpation, nose w/o d/c  Lungs -- normal respiratory effort, no intercostal retractions, no accessory muscle use, and normal breath sounds.   Heart-- normal rate, regular rhythm, no murmur, and no gallop.   Psych-- Cognition and judgment appear intact. Alert and cooperative with normal attention span and concentration.  not anxious appearing and not depressed appearing.       Assessment & Plan:

## 2012-03-15 ENCOUNTER — Encounter: Payer: Self-pay | Admitting: Internal Medicine

## 2012-03-20 ENCOUNTER — Ambulatory Visit: Payer: BC Managed Care – PPO | Admitting: Internal Medicine

## 2012-04-02 ENCOUNTER — Other Ambulatory Visit: Payer: Self-pay | Admitting: Obstetrics & Gynecology

## 2012-04-02 DIAGNOSIS — Z1231 Encounter for screening mammogram for malignant neoplasm of breast: Secondary | ICD-10-CM

## 2012-05-16 ENCOUNTER — Ambulatory Visit: Payer: BC Managed Care – PPO

## 2012-05-16 ENCOUNTER — Ambulatory Visit
Admission: RE | Admit: 2012-05-16 | Discharge: 2012-05-16 | Disposition: A | Discharge status: Home / Self Care | Payer: BC Managed Care – PPO | Source: Ambulatory Visit | Attending: Obstetrics & Gynecology | Admitting: Obstetrics & Gynecology

## 2012-05-16 DIAGNOSIS — Z1231 Encounter for screening mammogram for malignant neoplasm of breast: Secondary | ICD-10-CM

## 2012-05-22 ENCOUNTER — Encounter: Payer: Self-pay | Admitting: Internal Medicine

## 2012-05-22 ENCOUNTER — Ambulatory Visit (INDEPENDENT_AMBULATORY_CARE_PROVIDER_SITE_OTHER): Payer: BC Managed Care – PPO | Admitting: Internal Medicine

## 2012-05-22 VITALS — BP 134/80 | HR 84 | Temp 98.4°F | Wt 137.0 lb

## 2012-05-22 DIAGNOSIS — J309 Allergic rhinitis, unspecified: Secondary | ICD-10-CM

## 2012-05-22 DIAGNOSIS — G43909 Migraine, unspecified, not intractable, without status migrainosus: Secondary | ICD-10-CM

## 2012-05-22 DIAGNOSIS — J3089 Other allergic rhinitis: Secondary | ICD-10-CM

## 2012-05-22 DIAGNOSIS — I10 Essential (primary) hypertension: Secondary | ICD-10-CM

## 2012-05-22 MED ORDER — AZELASTINE-FLUTICASONE 137-50 MCG/ACT NA SUSP: 1.0000 | Freq: Two times a day (BID) | NASAL | Status: DC

## 2012-05-22 MED ORDER — LOSARTAN POTASSIUM 100 MG PO TABS: 100.0000 mg | ORAL_TABLET | Freq: Every day | ORAL | Status: DC

## 2012-05-22 MED ORDER — SUMATRIPTAN SUCCINATE 100 MG PO TABS: 100.0000 mg | ORAL_TABLET | ORAL | Status: DC | PRN

## 2012-05-22 NOTE — Progress Notes (Signed)
  Subjective:    Patient ID: Samantha Shepard, female    DOB: 10/03/1956, 56 y.o.   MRN: [id number]  HPI Her blood pressure has been well controlled averaging 130/75-80. She previously had palpitations; these responded to metoprolol as needed. At this time they are quiescent.  Her past medical history and family history were updated. Significantly both parents have hypertension. There is no family history of stroke. She believes her father was told that he can come off his blood pressure pills.    Review of Systems She denies chest pain, dyspnea, paroxysmal nocturnal dyspnea, edema, claudication, lightheadedness or syncope.  She has had some asthma flares and recently took prednisone. The major trigger is rhinitis which has not responded to the inhaled medications previously prescribed. Her medication list includes Zyrtec daily. The potential adverse effect of the decongestant her blood pressure and palpitations was discussed. She does have mometasone inhaler but is not using it on a regular basis.  She is requesting Imitrex for her migraines     Objective:   Physical Exam General appearance:good health ;well nourished; no acute distress or increased work of breathing is present.  No  lymphadenopathy about the head, neck, or axilla noted.   Eyes: No conjunctival inflammation or lid edema is present.   Ears:  External ear exam shows no significant lesions or deformities.  Otoscopic examination reveals clear canals, tympanic membranes are intact bilaterally without bulging, retraction, inflammation or discharge.  Nose:  External nasal examination shows no deformity or inflammation. Nasal mucosa are pink and moist without lesions or exudates. No septal dislocation or deviation.No obstruction to airflow.   Oral exam: Dental hygiene is good; lips and gums are healthy appearing.There is no oropharyngeal erythema or exudate noted.     Heart:  Normal rate and regular rhythm. S1 and S2 normal  without gallop, murmur, click, rub or other extra sounds.   Lungs:Chest clear to auscultation; no wheezes, rhonchi,rales ,or rubs present.No increased work of breathing.    Abdomen: bowel sounds normal, soft and non-tender without masses, organomegaly or hernias noted.  No guarding or rebound .No bruits or aortic aneurysm present.    Extremities:  No cyanosis, edema, or clubbing  noted    Skin: Warm & dry           Assessment & Plan:  #1 hypertension controlled; no change in medicines  #2 extrinsic rhinitis; trial of Dymista. Nasal hygiene maneuvers discussed  #3 asthma; prophylactic use of maintenance agent mometasone recommended  #4 migraines; prescription for Imitrex with monitor of blood pressure  Plan: See orders and recommendations

## 2012-05-22 NOTE — Patient Instructions (Addendum)
Blood Pressure Goal  Ideally is an AVERAGE < 135/85. This AVERAGE should be calculated from @ least 5-7 BP readings taken @ different times of day on different days of week. You should not respond to isolated BP readings , but rather the AVERAGE for that week . Plain Mucinex for thick secretions ;force NON dairy fluids . Use a Neti pot daily as needed for sinus congestion; going from open side to congested side . Nasal cleansing in the shower as discussed. Make sure that all residual soap is removed to prevent irritation. Dymista 1 spray in each nostril twice a day as needed. Use the "crossover" technique as discussed. Plain Allegra 160 daily as needed for itchy eyes & sneezing.    If you activate My Chart; the results can be released to you as soon as they populate from the lab. If you choose not to use this program; the labs have to be reviewed, copied & mailed   causing a delay in getting the results to you.

## 2012-07-01 ENCOUNTER — Telehealth: Payer: Self-pay

## 2012-07-01 NOTE — Telephone Encounter (Signed)
Paperwork received from [employer] Plan stating Dymista denied.

## 2012-07-02 ENCOUNTER — Telehealth: Payer: Self-pay

## 2012-07-02 NOTE — Telephone Encounter (Signed)
Pt calling because her insurance denied Dymista, called [phone] to start PA.

## 2012-07-07 ENCOUNTER — Other Ambulatory Visit: Payer: Self-pay | Admitting: Dermatology

## 2013-06-19 ENCOUNTER — Ambulatory Visit (INDEPENDENT_AMBULATORY_CARE_PROVIDER_SITE_OTHER): Payer: BC Managed Care – PPO | Admitting: Internal Medicine

## 2013-06-19 ENCOUNTER — Encounter: Payer: Self-pay | Admitting: Internal Medicine

## 2013-06-19 VITALS — BP 131/78 | HR 80 | Temp 98.6°F | Wt 139.8 lb

## 2013-06-19 DIAGNOSIS — I10 Essential (primary) hypertension: Secondary | ICD-10-CM

## 2013-06-19 DIAGNOSIS — J45909 Unspecified asthma, uncomplicated: Secondary | ICD-10-CM

## 2013-06-19 DIAGNOSIS — Z780 Asymptomatic menopausal state: Secondary | ICD-10-CM

## 2013-06-19 DIAGNOSIS — J309 Allergic rhinitis, unspecified: Secondary | ICD-10-CM

## 2013-06-19 DIAGNOSIS — G43909 Migraine, unspecified, not intractable, without status migrainosus: Secondary | ICD-10-CM

## 2013-06-19 DIAGNOSIS — E785 Hyperlipidemia, unspecified: Secondary | ICD-10-CM

## 2013-06-19 MED ORDER — LOSARTAN POTASSIUM 100 MG PO TABS: 100.0000 mg | ORAL_TABLET | Freq: Every day | ORAL | Status: DC

## 2013-06-19 NOTE — Patient Instructions (Addendum)
To prevent palpitations or premature beats, avoid stimulants such as decongestants, diet pills, nicotine, or caffeine (coffee, tea, cola, or chocolate) to excess. Minimal Blood Pressure Goal= AVERAGE < 140/90;  Ideal is an AVERAGE < 135/85. This AVERAGE should be calculated from @ least 5-7 BP readings taken @ different times of day on different days of week. You should not respond to isolated BP readings , but rather the AVERAGE for that week .Please bring your  blood pressure cuff to office visits to verify that it is reliable.It  can also be checked against the blood pressure device at the pharmacy. Finger or wrist cuffs are not dependable; an arm cuff is.Minimal Blood Pressure Goal= AVERAGE < 140/90;  Ideal is an AVERAGE < 135/85. This AVERAGE should be calculated from @ least 5-7 BP readings taken @ different times of day on different days of week. You should not respond to isolated BP readings , but rather the AVERAGE for that week .Please bring your  blood pressure cuff to office visits to verify that it is reliable.It  can also be checked against the blood pressure device at the pharmacy. Finger or wrist cuffs are not dependable; an arm cuff is. Plain Mucinex (NOT D) for thick secretions ;force NON dairy fluids .   Nasal cleansing in the shower as discussed with lather of mild shampoo.After 10 seconds wash off lather while  exhaling through nostrils. Make sure that all residual soap is removed to prevent irritation.  Dymista 1 spray in each nostril twice a day as needed. Use the "crossover" technique into opposite nostril spraying toward opposite ear @ 45 degree angle, not straight up into nostril.  Use a Neti pot daily only  as needed for significant sinus congestion; going from open side to congested side . Plain Allegra (NOT D )  160 daily , Loratidine 10 mg , OR Zyrtec 10 mg @ bedtime  as needed for itchy eyes & sneezing.     If you activate the  My Chart system; lab & Xray results will be  released directly  to you as soon as I review & address these through the computer. If you choose not to sign up for My Chart within 36 hours of labs being drawn; results will be reviewed & interpretation added before being copied & mailed, causing a delay in getting the results to you.If you do not receive that report within 7-10 days ,please call. Additionally you can use this system to gain direct  access to your records  if  out of town or @ an office of a  physician who is not in  the My Chart network.  This improves continuity of care & places you in control of your medical record.

## 2013-06-19 NOTE — Assessment & Plan Note (Signed)
Nasal hygiene prophylaxis outlined

## 2013-06-19 NOTE — Assessment & Plan Note (Signed)
Blood pressure goals written out. Avoidance of decongestants recommended if it's potential adverse effect on blood pressure. BMET

## 2013-06-19 NOTE — Progress Notes (Signed)
  Subjective:    Patient ID: Samantha Shepard, female    DOB: Jan 05, 1956, 57 y.o.   MRN: [id number]  HPI  She is here for refill of her blood pressure medications and risk assessment. There is a strong family history of hyperlipidemia; her mother, father, and sister all on statin, specifically Crestor.  Her last lipids were in 2009 and revealed a mild elevation of LDL at 131.1. The HDL is mildly reduced at 42.  She does take an antihistamine with decongestant intermittently. She also takes metoprolol as needed for palpitations.   Review of Systems She is on a modified heart healthy diet; she is not exercising .She denies chest pain, palpitations, dyspnea, or claudication.  Family history is negative for premature coronary disease. Advanced cholesterol testing not done to date. BP not monitored @ home. .     Objective:   Physical Exam Appears healthy and well-nourished & in no acute distress.Appears younger than stated age  No carotid bruits are present.No neck pain distention present at 10 - 15 degrees. Thyroid normal to palpation  Heart rhythm and rate are normal with no significant murmurs or gallops. S4  Chest is clear with no increased work of breathing  There is no evidence of aortic aneurysm or renal artery bruits  Abdomen soft with no organomegaly or masses. No HJR  No clubbing, cyanosis or edema present.  Pedal pulses are intact   No ischemic skin changes are present . Nails healthy    Alert and oriented. Strength, tone, DTRs reflexes normal          Assessment & Plan:  See Current Assessment & Plan in Problem List under specific Diagnosis

## 2013-06-19 NOTE — Assessment & Plan Note (Signed)
Advanced cholesterol testing recommended to optimally assess her long-term risk

## 2013-06-19 NOTE — Assessment & Plan Note (Signed)
BMD 

## 2013-06-25 ENCOUNTER — Encounter: Payer: Self-pay | Admitting: *Deleted

## 2013-06-25 ENCOUNTER — Telehealth: Payer: Self-pay | Admitting: *Deleted

## 2013-06-25 ENCOUNTER — Other Ambulatory Visit (INDEPENDENT_AMBULATORY_CARE_PROVIDER_SITE_OTHER): Payer: BC Managed Care – PPO

## 2013-06-25 ENCOUNTER — Other Ambulatory Visit: Payer: Self-pay | Admitting: Internal Medicine

## 2013-06-25 DIAGNOSIS — I10 Essential (primary) hypertension: Secondary | ICD-10-CM

## 2013-06-25 DIAGNOSIS — E785 Hyperlipidemia, unspecified: Secondary | ICD-10-CM

## 2013-06-25 LAB — BASIC METABOLIC PANEL
CO2: 21 mEq/L (ref 19–32)
Calcium: 9.2 mg/dL (ref 8.4–10.5)
Creatinine, Ser: 0.8 mg/dL (ref 0.4–1.2)
GFR: 79.17 mL/min (ref >60.00)
Sodium: 136 mEq/L (ref 135–145)

## 2013-06-25 LAB — TSH: TSH: 0.71 u[IU]/mL (ref 0.35–5.50)

## 2013-06-25 LAB — HEPATIC FUNCTION PANEL
ALT: 27 U/L (ref 0–35)
Bilirubin, Direct: 0 mg/dL (ref 0.0–0.3)
Total Protein: 7.4 g/dL (ref 6.0–8.3)

## 2013-06-25 NOTE — Telephone Encounter (Signed)
Letter sent. DJR  

## 2013-06-25 NOTE — Telephone Encounter (Signed)
Message copied by Eustace Quail on Thu Jun 25, 2013  3:21 PM ------      Message from: Pecola Lawless      Created: Thu Jun 25, 2013  1:34 PM       Congratulations ; all results are normal or in therapeutic range. No change indicated.  Hopp ------

## 2013-06-26 ENCOUNTER — Encounter: Payer: Self-pay | Admitting: Internal Medicine

## 2013-06-26 LAB — NMR LIPOPROFILE WITH LIPIDS
Cholesterol, Total: 205 mg/dL — ABNORMAL HIGH (ref <200)
LDL Particle Number: 2024 nmol/L — ABNORMAL HIGH (ref <1000)
Large VLDL-P: 3.4 nmol/L — ABNORMAL HIGH (ref <=2.7)
Small LDL Particle Number: 1175 nmol/L — ABNORMAL HIGH (ref <=527)
VLDL Size: 47.8 nm — ABNORMAL HIGH (ref <=46.6)

## 2013-06-30 ENCOUNTER — Encounter: Payer: Self-pay | Admitting: *Deleted

## 2013-07-01 ENCOUNTER — Other Ambulatory Visit: Payer: Self-pay

## 2013-07-30 ENCOUNTER — Other Ambulatory Visit: Payer: Self-pay | Admitting: Dermatology

## 2014-01-21 ENCOUNTER — Other Ambulatory Visit: Payer: Self-pay

## 2014-01-21 ENCOUNTER — Other Ambulatory Visit: Payer: Self-pay | Admitting: Internal Medicine

## 2014-01-21 DIAGNOSIS — Z1231 Encounter for screening mammogram for malignant neoplasm of breast: Secondary | ICD-10-CM

## 2014-01-29 ENCOUNTER — Ambulatory Visit
Admission: RE | Admit: 2014-01-29 | Discharge: 2014-01-29 | Disposition: A | Discharge status: Home / Self Care | Payer: BC Managed Care – PPO | Source: Ambulatory Visit

## 2014-01-29 DIAGNOSIS — Z1231 Encounter for screening mammogram for malignant neoplasm of breast: Secondary | ICD-10-CM

## 2014-02-04 ENCOUNTER — Ambulatory Visit: Payer: BC Managed Care – PPO

## 2014-02-04 ENCOUNTER — Other Ambulatory Visit: Payer: BC Managed Care – PPO

## 2014-08-06 ENCOUNTER — Ambulatory Visit: Payer: BC Managed Care – PPO | Admitting: Internal Medicine

## 2014-08-09 ENCOUNTER — Encounter: Payer: Self-pay | Admitting: Internal Medicine

## 2014-08-09 ENCOUNTER — Ambulatory Visit: Payer: BC Managed Care – PPO | Admitting: Internal Medicine

## 2014-08-09 ENCOUNTER — Ambulatory Visit (INDEPENDENT_AMBULATORY_CARE_PROVIDER_SITE_OTHER): Payer: BC Managed Care – PPO | Admitting: Internal Medicine

## 2014-08-09 VITALS — BP 138/90 | HR 95 | Temp 98.3°F | Resp 12 | Wt 143.5 lb

## 2014-08-09 DIAGNOSIS — Z8249 Family history of ischemic heart disease and other diseases of the circulatory system: Secondary | ICD-10-CM

## 2014-08-09 DIAGNOSIS — R0609 Other forms of dyspnea: Secondary | ICD-10-CM

## 2014-08-09 DIAGNOSIS — J3089 Other allergic rhinitis: Secondary | ICD-10-CM

## 2014-08-09 DIAGNOSIS — E785 Hyperlipidemia, unspecified: Secondary | ICD-10-CM

## 2014-08-09 DIAGNOSIS — I10 Essential (primary) hypertension: Secondary | ICD-10-CM

## 2014-08-09 MED ORDER — MOMETASONE FUROATE 220 MCG/INH IN AEPB: 2.0000 | INHALATION_SPRAY | Freq: Every day | RESPIRATORY_TRACT | Status: DC

## 2014-08-09 MED ORDER — AZELASTINE-FLUTICASONE 137-50 MCG/ACT NA SUSP: 1.0000 | Freq: Two times a day (BID) | NASAL | Status: DC

## 2014-08-09 NOTE — Assessment & Plan Note (Signed)
Lipids, LFTs, TSH  

## 2014-08-09 NOTE — Progress Notes (Signed)
   Subjective:    Patient ID: Samantha Shepard, female    DOB: 1956-02-25, 58 y.o.   MRN: [id number]  HPI  She is here to assess active health issues & conditions. PMH, FH, & Social history verified & updated    Possible premature familial  coronary artery disease. Her mother had a myocardial infarction her 50s as well as bypass grafting. She also stroke in her 1s.  She checks her blood pressure home only occasionally; is in the 130 over 80s.  She's having occasional exertional dyspnea and chest tightness with exertion particularly with inclines. Her only exercise is occasional , not on regular basis..  She is on a modified heart healthy diet. She does not restrict salt.  Advanced cholesterol testing reveals her LDL goal is less than 100, ideally less than 70. Most recently her risk was increased greater than 20% based on LDL of 125.  Her migraines  have been quiescent   Review of Systems    She is having extrinsic rhinitis symptoms with postnasal drainage and itchy, watery eyes on the left. Cough is variably productive. Otherwise asthma is quiescent.     Objective:   Physical Exam General appearance:good health ;well nourished; no acute distress or increased work of breathing is present.  No  lymphadenopathy about the head, neck, or axilla noted.   Eyes: No conjunctival inflammation or lid edema is present. There is no scleral icterus.  Ears:  External ear exam shows no significant lesions or deformities.  Otoscopic examination reveals clear canals, tympanic membranes are intact bilaterally without bulging, retraction, inflammation or discharge.  Nose:  External nasal examination shows no deformity or inflammation. Nasal mucosa are pink and moist without lesions or exudates. No septal dislocation or deviation.No obstruction to airflow.   Oral exam: Dental hygiene is good; lips and gums are healthy appearing.There is no oropharyngeal erythema or exudate noted.   Neck:  No  deformities, thyromegaly, masses, or tenderness noted.   Supple with full range of motion without pain.   Heart:  Normal rate and regular rhythm. S1 and S2 normal without gallop, murmur, click, rub or other extra sounds.   Lungs:Chest clear to auscultation; no wheezes, rhonchi,rales ,or rubs present.No increased work of breathing.    Extremities:  No cyanosis, edema, or clubbing  noted    Skin: Warm & dry w/o jaundice or tenting.         Assessment & Plan:  See Current Assessment & Plan in Problem List under specific Diagnosis  With her hyperlipidemia and family history; the dyspnea on exertion requires assessment with stress testing. It could be related to exercise-induced bronchospasm however.  EKG cannot be completed today as there is computer malfunction.

## 2014-08-09 NOTE — Assessment & Plan Note (Signed)
Blood pressure goals reviewed. BMET 

## 2014-08-09 NOTE — Assessment & Plan Note (Signed)
See med renewals Nasal hygiene

## 2014-08-09 NOTE — Patient Instructions (Addendum)
Your next office appointment will be determined based upon review of your pending labs . Those instructions will be transmitted to you through My Chart  OR  by mail;whichever process is your choice to receive results & recommendations . Minimal Blood Pressure Goal= AVERAGE < 140/90;  Ideal is an AVERAGE < 135/85. This AVERAGE should be calculated from @ least 5-7 BP readings taken @ different times of day on different days of week. You should not respond to isolated BP readings , but rather the AVERAGE for that week .Please bring your  blood pressure cuff to office visits to verify that it is reliable.It  can also be checked against the blood pressure device at the pharmacy. Finger or wrist cuffs are not dependable; an arm cuff is.  Plain Mucinex (NOT D) for thick secretions ;force NON dairy fluids .   Nasal cleansing in the shower as discussed with lather of mild shampoo.After 10 seconds wash off lather while  exhaling through nostrils. Make sure that all residual soap is removed to prevent irritation.  Flonase OR Nasacort AQ 1 spray in each nostril twice a day as needed. Use the "crossover" technique into opposite nostril spraying toward opposite ear @ 45 degree angle, not straight up into nostril.  Use a Neti pot daily only  as needed for significant sinus congestion; going from open side to congested side . Plain Allegra (NOT D )  160 daily , Loratidine 10 mg , OR Zyrtec 10 mg @ bedtime  as needed for itchy eyes & sneezing.

## 2014-08-09 NOTE — Progress Notes (Signed)
Pre visit review using our clinic review tool, if applicable. No additional management support is needed unless otherwise documented below in the visit note. 

## 2014-08-13 ENCOUNTER — Other Ambulatory Visit (INDEPENDENT_AMBULATORY_CARE_PROVIDER_SITE_OTHER): Payer: BC Managed Care – PPO

## 2014-08-13 DIAGNOSIS — I10 Essential (primary) hypertension: Secondary | ICD-10-CM

## 2014-08-13 DIAGNOSIS — E785 Hyperlipidemia, unspecified: Secondary | ICD-10-CM

## 2014-08-13 LAB — LIPID PANEL
CHOL/HDL RATIO: 5
Cholesterol: 199 mg/dL (ref 0–200)
HDL: 39.3 mg/dL (ref >39.00)
LDL Cholesterol: 127 mg/dL — ABNORMAL HIGH (ref 0–99)
NonHDL: 159.7
Triglycerides: 165 mg/dL — ABNORMAL HIGH (ref 0.0–149.0)
VLDL: 33 mg/dL (ref 0.0–40.0)

## 2014-08-13 LAB — HEPATIC FUNCTION PANEL
ALK PHOS: 43 U/L (ref 39–117)
ALT: 21 U/L (ref 0–35)
AST: 18 U/L (ref 0–37)
Albumin: 3.4 g/dL — ABNORMAL LOW (ref 3.5–5.2)
BILIRUBIN DIRECT: 0.1 mg/dL (ref 0.0–0.3)
TOTAL PROTEIN: 6.9 g/dL (ref 6.0–8.3)
Total Bilirubin: 0.7 mg/dL (ref 0.2–1.2)

## 2014-08-13 LAB — BASIC METABOLIC PANEL
BUN: 14 mg/dL (ref 6–23)
CO2: 24 mEq/L (ref 19–32)
Calcium: 9 mg/dL (ref 8.4–10.5)
Chloride: 107 mEq/L (ref 96–112)
Creatinine, Ser: 0.8 mg/dL (ref 0.4–1.2)
GFR: 80.04 mL/min (ref >60.00)
Glucose, Bld: 97 mg/dL (ref 70–99)
POTASSIUM: 4.1 meq/L (ref 3.5–5.1)
SODIUM: 136 meq/L (ref 135–145)

## 2014-08-13 LAB — TSH: TSH: 1.01 u[IU]/mL (ref 0.35–4.50)

## 2014-08-19 ENCOUNTER — Other Ambulatory Visit: Payer: Self-pay | Admitting: Internal Medicine

## 2014-10-27 ENCOUNTER — Ambulatory Visit: Payer: BC Managed Care – PPO | Admitting: Internal Medicine

## 2014-10-28 ENCOUNTER — Encounter: Payer: Self-pay | Admitting: Internal Medicine

## 2014-10-28 ENCOUNTER — Ambulatory Visit (INDEPENDENT_AMBULATORY_CARE_PROVIDER_SITE_OTHER): Payer: BC Managed Care – PPO | Admitting: Internal Medicine

## 2014-10-28 VITALS — BP 140/70 | HR 79 | Temp 98.2°F | Resp 15 | Ht 62.0 in | Wt 137.5 lb

## 2014-10-28 DIAGNOSIS — J3089 Other allergic rhinitis: Secondary | ICD-10-CM

## 2014-10-28 DIAGNOSIS — R0609 Other forms of dyspnea: Secondary | ICD-10-CM

## 2014-10-28 DIAGNOSIS — E785 Hyperlipidemia, unspecified: Secondary | ICD-10-CM

## 2014-10-28 MED ORDER — AMOXICILLIN 500 MG PO CAPS: 500.0000 mg | ORAL_CAPSULE | Freq: Three times a day (TID) | ORAL | Status: DC

## 2014-10-28 NOTE — Patient Instructions (Addendum)
Plain Mucinex (NOT D) for thick secretions ;force NON dairy fluids .   Nasal cleansing in the shower as discussed with lather of mild shampoo.After 10 seconds wash off lather while  exhaling through nostrils. Make sure that all residual soap is removed to prevent irritation.  Flonase OR Nasacort AQ 1 spray in each nostril twice a day as needed. Use the "crossover" technique into opposite nostril spraying toward opposite ear @ 45 degree angle, not straight up into nostril.  Plain Allegra (NOT D )  160 daily , Loratidine 10 mg , OR Zyrtec 10 mg @ bedtime  as needed for itchy eyes & sneezing.   Zicam Melts or Zinc lozenges as per package label for sore throat . Complementary options include  vitamin C 2000 mg daily; & Echinacea for 4-7 days.  Fill the  prescription for antibiotic if fever; discolored nasal or chest secretions; or frontal headache or facial pain  present

## 2014-10-28 NOTE — Progress Notes (Signed)
   Subjective:    Patient ID: Samantha Shepard, female    DOB: 02-May-1956, 59 y.o.   MRN: [id number]  HPI   Symptoms began 10/24/14 as head congestion and rhinitis. She states she's had 3 such "colds" since November.  The major symptoms this time are nasal congestion with an  obstructive pattern. She also has fatigue and some frontal pressure without headache or pain. She does have some paranasal discomfort.  She describes pressure in ears.  She also has significant itchy, watery eyes and sneezing.  She has an Occupational psychologist.  Review of Systems She denies facial/maxillary sinus pain; fever; otic discharge; or dental pain.  She she does not have cough but describes exertional dyspnea. Stress test was recommended when seen in November; this has not been scheduled inadvertently.    Objective:   Physical Exam  General appearance:good health ;well nourished; no acute distress or increased work of breathing is present.  No  lymphadenopathy about the head, neck, or axilla noted.   Eyes: No conjunctival inflammation or lid edema is present. There is no scleral icterus.  Ears:  External ear exam shows no significant lesions or deformities.  Otoscopic examination reveals clear canals, tympanic membranes are intact bilaterally without bulging, retraction, inflammation or discharge.  Nose:  External nasal examination shows no deformity or inflammation. Nasal mucosa are dry & slightly erythematous without lesions or exudates. No septal dislocation or deviation.No obstruction to airflow.   Oral exam: Dental hygiene is good; lips and gums are healthy appearing.There is no oropharyngeal erythema or exudate noted.   Neck:  No deformities, thyromegaly, masses, or tenderness noted.   Supple with full range of motion without pain.   Heart:  Normal rate and regular rhythm. S1 and S2 normal without gallop, murmur, click, rub or other extra sounds.   Lungs:Chest clear to auscultation; no wheezes,  rhonchi,rales ,or rubs present.No increased work of breathing.    Extremities:  No cyanosis, edema, or clubbing  noted    Skin: Warm & dry w/o jaundice or tenting.       Assessment & Plan:  #1 allergic rhinitis #2 DOE See orders & AVS

## 2014-10-28 NOTE — Progress Notes (Signed)
Pre visit review using our clinic review tool, if applicable. No additional management support is needed unless otherwise documented below in the visit note. 

## 2014-10-30 ENCOUNTER — Emergency Department (HOSPITAL_BASED_OUTPATIENT_CLINIC_OR_DEPARTMENT_OTHER)
Admission: EM | Admit: 2014-10-30 | Discharge: 2014-10-30 | Disposition: A | Discharge status: Home / Self Care | Payer: BC Managed Care – PPO | Attending: Emergency Medicine | Admitting: Emergency Medicine

## 2014-10-30 ENCOUNTER — Encounter (HOSPITAL_BASED_OUTPATIENT_CLINIC_OR_DEPARTMENT_OTHER): Payer: Self-pay | Admitting: *Deleted

## 2014-10-30 ENCOUNTER — Emergency Department (HOSPITAL_BASED_OUTPATIENT_CLINIC_OR_DEPARTMENT_OTHER): Payer: BC Managed Care – PPO

## 2014-10-30 DIAGNOSIS — J45909 Unspecified asthma, uncomplicated: Secondary | ICD-10-CM | POA: Insufficient documentation

## 2014-10-30 DIAGNOSIS — Z8639 Personal history of other endocrine, nutritional and metabolic disease: Secondary | ICD-10-CM | POA: Insufficient documentation

## 2014-10-30 DIAGNOSIS — W010XXA Fall on same level from slipping, tripping and stumbling without subsequent striking against object, initial encounter: Secondary | ICD-10-CM | POA: Diagnosis not present

## 2014-10-30 DIAGNOSIS — G43909 Migraine, unspecified, not intractable, without status migrainosus: Secondary | ICD-10-CM | POA: Insufficient documentation

## 2014-10-30 DIAGNOSIS — Y9259 Other trade areas as the place of occurrence of the external cause: Secondary | ICD-10-CM | POA: Insufficient documentation

## 2014-10-30 DIAGNOSIS — Y998 Other external cause status: Secondary | ICD-10-CM | POA: Diagnosis not present

## 2014-10-30 DIAGNOSIS — R0789 Other chest pain: Secondary | ICD-10-CM

## 2014-10-30 DIAGNOSIS — Z7951 Long term (current) use of inhaled steroids: Secondary | ICD-10-CM | POA: Diagnosis not present

## 2014-10-30 DIAGNOSIS — Z792 Long term (current) use of antibiotics: Secondary | ICD-10-CM | POA: Diagnosis not present

## 2014-10-30 DIAGNOSIS — Y9389 Activity, other specified: Secondary | ICD-10-CM | POA: Insufficient documentation

## 2014-10-30 DIAGNOSIS — Z79899 Other long term (current) drug therapy: Secondary | ICD-10-CM | POA: Insufficient documentation

## 2014-10-30 DIAGNOSIS — Y9289 Other specified places as the place of occurrence of the external cause: Secondary | ICD-10-CM | POA: Insufficient documentation

## 2014-10-30 DIAGNOSIS — I10 Essential (primary) hypertension: Secondary | ICD-10-CM | POA: Diagnosis not present

## 2014-10-30 DIAGNOSIS — S299XXA Unspecified injury of thorax, initial encounter: Secondary | ICD-10-CM | POA: Diagnosis not present

## 2014-10-30 DIAGNOSIS — R52 Pain, unspecified: Secondary | ICD-10-CM

## 2014-10-30 MED ORDER — ACETAMINOPHEN 325 MG PO TABS
650.0000 mg | ORAL_TABLET | Freq: Once | ORAL | Status: AC
  Administered 2014-10-30: 650 mg via ORAL
  Filled 2014-10-30: qty 2

## 2014-10-30 MED ORDER — IBUPROFEN 800 MG PO TABS
800.0000 mg | ORAL_TABLET | Freq: Once | ORAL | Status: AC
  Administered 2014-10-30: 800 mg via ORAL
  Filled 2014-10-30: qty 1

## 2014-10-30 MED ORDER — OXYCODONE-ACETAMINOPHEN 5-325 MG PO TABS: 1.0000 | ORAL_TABLET | ORAL | Status: DC | PRN

## 2014-10-30 NOTE — ED Provider Notes (Signed)
CSN: [id number]     Arrival date & time 10/30/14  1402 History   First MD Initiated Contact with Patient 10/30/14 1419     Chief Complaint  Patient presents with  . Fall     (Consider location/radiation/quality/duration/timing/severity/associated sxs/prior Treatment) HPI 24 female slipped and fell at [facility] on Thursday falling to ground.  She has pain in the left lateral chest wall.  It is sharp and nonradiating.  She has not attempted any intervention.  She is not sob.  It is improved by being still.  She does not smoke.  Past Medical History  Diagnosis Date  . Allergy     dust, cats  . Hypertension   . Asthma   . Hyperlipidemia   . Migraines    Past Surgical History  Procedure Laterality Date  . Tonsillectomy    . Thyroid cyst aspirated    . Colonoscopy  2009    neg; [facility]   Family History  Problem Relation Age of Onset  . Heart disease Mother     CABG 3 vessel @ 88  . Hypertension Mother   . Hyperlipidemia Mother   . Diabetes Mother   . Stroke Mother 71  . Prostate cancer Father   . Hypertension Father   . Breast cancer Sister   . Hyperlipidemia Sister    History  Substance Use Topics  . Smoking status: Never Smoker   . Smokeless tobacco: Not on file  . Alcohol Use: 4.2 oz/week    7 Glasses of wine per week     Comment: wine    OB History    No data available     Review of Systems  All other systems reviewed and are negative.     Allergies  Sulfonamide derivatives and Codeine  Home Medications   Prior to Admission medications   Medication Sig Start Date End Date Taking? Authorizing Provider  amoxicillin (AMOXIL) 500 MG capsule Take 1 capsule (500 mg total) by mouth 3 (three) times daily. 10/28/14   Hendricks Limes, MD  Azelastine-Fluticasone [facility]) 137-50 MCG/ACT SUSP Place 1 spray into the nose 2 (two) times daily. 08/09/14   Hendricks Limes, MD  cetirizine (ZYRTEC) 10 MG tablet Take 10 mg by mouth daily.    Historical Provider,  MD  estradiol-norethindrone (ACTIVELLA) 1-0.5 MG per tablet Take 1 tablet by mouth every other day. 05/29/13   Historical Provider, MD  losartan (COZAAR) 100 MG tablet TAKE 1 TABLET BY MOUTH DAILY 08/19/14   Hendricks Limes, MD  mometasone [facility]) 220 MCG/INH inhaler Inhale 2 puffs into the lungs daily. 08/09/14   Hendricks Limes, MD  SUMAtriptan (IMITREX) 100 MG tablet Take 1 tablet (100 mg total) by mouth every 2 (two) hours as needed for migraine. 05/22/12 06/19/13  Hendricks Limes, MD   BP 173/88 mmHg  Pulse 96  Temp(Src) 97.8 F (36.6 C) (Oral)  Resp 22  Ht 5\' 2"  (1.575 m)  Wt 137 lb (62.143 kg)  BMI 25.05 kg/m2  SpO2 100% Physical Exam  Constitutional: She is oriented to person, place, and time. She appears well-nourished.  HENT:  Head: Normocephalic and atraumatic.  Right Ear: External ear normal.  Left Ear: External ear normal.  Nose: Nose normal.  Mouth/Throat: Oropharynx is clear and moist.  Eyes: Conjunctivae are normal. Pupils are equal, round, and reactive to light.  Neck: Normal range of motion. Neck supple.  Cardiovascular: Normal rate, normal heart sounds and intact distal pulses.  Pulmonary/Chest: Effort normal and breath sounds normal. She exhibits tenderness.    Abdominal: Soft. Bowel sounds are normal.  Musculoskeletal: Normal range of motion.  Neurological: She is alert and oriented to person, place, and time.  Skin: Skin is warm and dry.  Psychiatric: She has a normal mood and affect.  Nursing note and vitals reviewed.   ED Course  Procedures (including critical care time) Labs Review Labs Reviewed - No data to display  Imaging Review No results found.   EKG Interpretation None      MDM   Final diagnoses:  Pain  Left-sided chest wall pain        Shaune Pollack, MD 11/01/14 2340

## 2014-10-30 NOTE — ED Notes (Signed)
Pt tripped and fell on Thursday evening and continues to have increasing left rib pain.

## 2014-10-30 NOTE — Discharge Instructions (Signed)
Chest Contusion °A contusion is a deep bruise. Bruises happen when an injury causes bleeding under the skin. Signs of bruising include pain, puffiness (swelling), and discolored skin. The bruise may turn blue, purple, or yellow.  °HOME CARE °· Put ice on the injured area. °¨ Put ice in a plastic bag. °¨ Place a towel between the skin and the bag. °¨ Leave the ice on for 15-20 minutes at a time, 03-04 times a day for the first 48 hours. °· Only take medicine as told by your doctor. °· Rest. °· Take deep breaths (deep-breathing exercises) as told by your doctor. °· Stop smoking if you smoke. °· Do not lift objects over 5 pounds (2.3 kilograms) for 3 days or longer if told by your doctor. °GET HELP RIGHT AWAY IF:  °· You have more bruising or puffiness. °· You have pain that gets worse. °· You have trouble breathing. °· You are dizzy, weak, or pass out (faint). °· You have blood in your pee (urine) or poop (stool). °· You cough up or throw up (vomit) blood. °· Your puffiness or pain is not helped with medicines. °MAKE SURE YOU:  °· Understand these instructions. °· Will watch your condition. °· Will get help right away if you are not doing well or get worse. °Document Released: 03/12/2008 Document Revised: 06/18/2012 Document Reviewed: 03/17/2012 °ExitCare® Patient Information ©2015 [employer]. This information is not intended to replace advice given to you by your health care provider. Make sure you discuss any questions you have with your health care provider. ° °

## 2014-11-01 ENCOUNTER — Telehealth: Payer: Self-pay

## 2014-11-01 NOTE — Telephone Encounter (Addendum)
Upon completion of the PA for Dymista, immediate notification was sent stating approval effective 10/02/14 through 11/01/2015. Still waiting on response for Asmanex

## 2014-11-01 NOTE — Telephone Encounter (Addendum)
Received a fax from Samantha Shepard in [facility] on Samantha Shepard stating Asmasnex and Dymista both require prior authorizations. These were completed on cover my meds. Waiting on insurance response.

## 2014-11-09 NOTE — Telephone Encounter (Signed)
Asmanex has been denied by patient's insurance. Information has been placed in Dr [employer].

## 2014-11-12 MED ORDER — BECLOMETHASONE DIPROPIONATE 80 MCG/ACT IN AERS: INHALATION_SPRAY | RESPIRATORY_TRACT | Status: DC

## 2014-11-12 NOTE — Addendum Note (Signed)
Addended by: Roma Schanz R on: 11/12/2014 01:55 PM   Modules accepted: Orders, Medications

## 2014-11-12 NOTE — Telephone Encounter (Signed)
Per Dr Linna Darner, patient is to start on Qvar 80 mcg 1-2 puffs q 12 hours as insurance will cover this.

## 2014-12-28 ENCOUNTER — Ambulatory Visit (HOSPITAL_COMMUNITY)
Admission: RE | Admit: 2014-12-28 | Discharge: 2014-12-28 | Disposition: A | Discharge status: Home / Self Care | Payer: BC Managed Care – PPO | Source: Ambulatory Visit | Attending: Cardiovascular Disease | Admitting: Cardiovascular Disease

## 2014-12-28 DIAGNOSIS — R0609 Other forms of dyspnea: Secondary | ICD-10-CM

## 2014-12-28 DIAGNOSIS — E785 Hyperlipidemia, unspecified: Secondary | ICD-10-CM | POA: Diagnosis not present

## 2014-12-28 NOTE — Procedures (Signed)
Exercise Treadmill Test  Pre-Exercise Testing Evaluation Rhythm: normal sinus                  Test  Exercise Tolerance Test Ordering MD: Unice Cobble, MD  Interpreting MD:   Unique Test No: 1 Treadmill:  1  Indication for ETT: exertional dyspnea  Contraindication to ETT: No   Stress Modality: exercise - treadmill  Cardiac Imaging Performed: non   Protocol: standard Bruce - maximal  Max BP:  191/97  Max MPHR (bpm):  160 85% MPR (bpm):  136  MPHR obtained (bpm):  155 % MPHR obtained:  96  Reached 85% MPHR (min:sec):  3:55 Total Exercise Time (min-sec):  6:30  Workload in METS:  7.70 Borg Scale:   Reason ETT Terminated:  dyspnea    ST Segment Analysis At Rest: normal ST segments - no evidence of significant ST depression With Exercise: no evidence of significant ST depression  Other Information Arrhythmia:  No Angina during ETT:  absent (0) Quality of ETT:  diagnostic  ETT Interpretation:  normal - no evidence of ischemia by ST analysis  Comments: Nl GXT  Recommendations: Follow up with Dr. Linna Darner

## 2015-03-14 ENCOUNTER — Other Ambulatory Visit: Payer: Self-pay | Admitting: Internal Medicine

## 2015-06-07 ENCOUNTER — Other Ambulatory Visit: Payer: Self-pay | Admitting: Emergency Medicine

## 2015-06-07 ENCOUNTER — Other Ambulatory Visit: Payer: Self-pay | Admitting: Internal Medicine

## 2015-06-07 MED ORDER — LOSARTAN POTASSIUM 100 MG PO TABS: 100.0000 mg | ORAL_TABLET | Freq: Every day | ORAL | Status: DC

## 2015-07-04 ENCOUNTER — Other Ambulatory Visit: Payer: Self-pay

## 2015-07-04 DIAGNOSIS — Z1231 Encounter for screening mammogram for malignant neoplasm of breast: Secondary | ICD-10-CM

## 2015-08-05 ENCOUNTER — Ambulatory Visit
Admission: RE | Admit: 2015-08-05 | Discharge: 2015-08-05 | Disposition: A | Discharge status: Home / Self Care | Payer: BC Managed Care – PPO | Source: Ambulatory Visit

## 2015-08-05 DIAGNOSIS — Z1231 Encounter for screening mammogram for malignant neoplasm of breast: Secondary | ICD-10-CM

## 2015-09-03 ENCOUNTER — Other Ambulatory Visit: Payer: Self-pay | Admitting: Internal Medicine

## 2015-10-26 ENCOUNTER — Ambulatory Visit (INDEPENDENT_AMBULATORY_CARE_PROVIDER_SITE_OTHER): Payer: BC Managed Care – PPO | Admitting: Nurse Practitioner

## 2015-10-26 ENCOUNTER — Encounter: Payer: Self-pay | Admitting: Nurse Practitioner

## 2015-10-26 VITALS — BP 162/82 | HR 93 | Temp 98.7°F | Resp 18 | Ht 62.0 in | Wt 144.0 lb

## 2015-10-26 DIAGNOSIS — J069 Acute upper respiratory infection, unspecified: Secondary | ICD-10-CM

## 2015-10-26 DIAGNOSIS — B9789 Other viral agents as the cause of diseases classified elsewhere: Principal | ICD-10-CM

## 2015-10-26 MED ORDER — PREDNISONE 10 MG PO TABS: ORAL_TABLET | ORAL | Status: DC

## 2015-10-26 NOTE — Patient Instructions (Addendum)
Prednisone with breakfast or lunch at the latest.  6 tablets on day 1, 5 tablets on day 2, 4 tablets on day 3, 3 tablets on day 4, 2 tablets day 5, 1 tablet on day 6...done! Take tablets all together not spaced out Don't take with NSAIDs (Ibuprofen, Aleve, Naproxen, Meloxicam ect...)  Watch your blood pressure! May go up

## 2015-10-26 NOTE — Progress Notes (Signed)
Patient ID: Samantha Shepard, female    DOB: [birth year]  Age: 60 y.o. MRN: [id number]  CC: Acute Visit   HPI Samantha Shepard presents for CC of congestion x 3 days.   1) Pt reports a dry cough, nasal dripping, ear pain on right,  Congestion with white phlegm, light-headed intermittently for a few seconds Sick contacts: Father is in nursing home  Treatment to date:  Dymista Zyrtec Mucinex- not helpful    History Samantha Shepard has a past medical history of Allergy; Hypertension; Asthma; Hyperlipidemia; and Migraines.   She has past surgical history that includes Tonsillectomy; thyroid cyst aspirated; and Colonoscopy (2009).   Her family history includes Breast cancer in her sister; Diabetes in her mother; Heart disease in her mother; Hyperlipidemia in her mother and sister; Hypertension in her father and mother; Prostate cancer in her father; Stroke (age of onset: 77) in her mother.She reports that she has never smoked. She does not have any smokeless tobacco history on file. She reports that she drinks about 4.2 oz of alcohol per week. She reports that she does not use illicit drugs.  Outpatient Prescriptions Prior to Visit  Medication Sig Dispense Refill  . Azelastine-Fluticasone (DYMISTA) 137-50 MCG/ACT SUSP Place 1 spray into the nose 2 (two) times daily. 23 g 5  . cetirizine (ZYRTEC) 10 MG tablet Take 10 mg by mouth daily.    Marland Kitchen losartan (COZAAR) 100 MG tablet TAKE 1 TABLET(100 MG) BY MOUTH DAILY 90 tablet 0  . SUMAtriptan (IMITREX) 100 MG tablet Take 1 tablet (100 mg total) by mouth every 2 (two) hours as needed for migraine. 10 tablet 0  . estradiol-norethindrone (ACTIVELLA) 1-0.5 MG per tablet Take 1 tablet by mouth every other day. Reported on 10/26/2015    . amoxicillin (AMOXIL) 500 MG capsule Take 1 capsule (500 mg total) by mouth 3 (three) times daily. (Patient not taking: Reported on 10/26/2015) 30 capsule 0  . beclomethasone (QVAR) 80 MCG/ACT inhaler 1-2 puffs  (inhalation) every  12 hours (Patient not taking: Reported on 10/26/2015) 1 Inhaler 5  . oxyCODONE-acetaminophen (PERCOCET/ROXICET) 5-325 MG per tablet Take 1 tablet by mouth every 4 (four) hours as needed for severe pain. (Patient not taking: Reported on 10/26/2015) 15 tablet 0   No facility-administered medications prior to visit.    ROS Review of Systems  Constitutional: Negative for fever, chills, diaphoresis and fatigue.  HENT: Positive for congestion, ear pain, postnasal drip, rhinorrhea, sneezing and sore throat. Negative for sinus pressure, trouble swallowing and voice change.   Eyes: Negative for visual disturbance.  Respiratory: Positive for cough. Negative for chest tightness, shortness of breath and wheezing.   Cardiovascular: Negative for chest pain, palpitations and leg swelling.  Gastrointestinal: Negative for nausea, vomiting and diarrhea.  Skin: Negative for rash.   Objective:  BP 162/82 mmHg  Pulse 93  Temp(Src) 98.7 F (37.1 C) (Oral)  Resp 18  Ht 5\' 2"  (1.575 m)  Wt 144 lb (65.318 kg)  BMI 26.33 kg/m2  SpO2 98%  Physical Exam  Constitutional: She is oriented to person, place, and time. She appears well-developed and well-nourished. No distress.  HENT:  Head: Normocephalic and atraumatic.  Right Ear: External ear normal.  Left Ear: External ear normal.  Eyes: EOM are normal. Pupils are equal, round, and reactive to light. Right eye exhibits no discharge. Left eye exhibits no discharge. No scleral icterus.  Neck: Normal range of motion. Neck supple.  Cardiovascular: Normal rate, regular rhythm and normal heart sounds.  Exam reveals no gallop and no friction rub.   No murmur heard. Pulmonary/Chest: Effort normal and breath sounds normal. No respiratory distress. She has no wheezes. She has no rales. She exhibits no tenderness.  Lymphadenopathy:    She has no cervical adenopathy.  Neurological: She is alert and oriented to person, place, and time. No cranial nerve deficit. She  exhibits normal muscle tone. Coordination normal.  Skin: Skin is warm and dry. No rash noted. She is not diaphoretic.  Psychiatric: She has a normal mood and affect. Her behavior is normal. Judgment and thought content normal.   Assessment & Plan:   Samantha Shepard was seen today for acute visit.  Diagnoses and all orders for this visit:  Viral URI with cough  Other orders -     predniSONE (DELTASONE) 10 MG tablet; Take 6 tablets by mouth on day 1 with breakfast then decrease by 1 tablet each day until gone.  I have discontinued Samantha Shepard's amoxicillin, oxyCODONE-acetaminophen, and beclomethasone. I am also having her start on predniSONE. Additionally, I am having her maintain her SUMAtriptan, estradiol-norethindrone, cetirizine, Azelastine-Fluticasone, losartan, and norethindrone.  Meds ordered this encounter  Medications  . norethindrone (JOLIVETTE) 0.35 MG tablet    Sig: Take 1 tablet by mouth daily.  . predniSONE (DELTASONE) 10 MG tablet    Sig: Take 6 tablets by mouth on day 1 with breakfast then decrease by 1 tablet each day until gone.    Dispense:  21 tablet    Refill:  0    Order Specific Question:  Supervising Provider    Answer:  Crecencio Mc [[id number]]     Follow-up: Return if symptoms worsen or fail to improve.

## 2015-10-26 NOTE — Progress Notes (Signed)
Pre visit review using our clinic review tool, if applicable. No additional management support is needed unless otherwise documented below in the visit note. 

## 2015-10-26 NOTE — Assessment & Plan Note (Signed)
New onset Well appearing female in office today  Prednisone taper to help with symptoms of ETD and congestion, instructions on AVS and verbally given  Continue OTC and conservative measures RTC if fever or worsening of symptoms despite treatment

## 2015-12-13 ENCOUNTER — Other Ambulatory Visit: Payer: Self-pay | Admitting: Internal Medicine

## 2016-01-03 ENCOUNTER — Ambulatory Visit (INDEPENDENT_AMBULATORY_CARE_PROVIDER_SITE_OTHER): Payer: BC Managed Care – PPO | Admitting: Internal Medicine

## 2016-01-03 ENCOUNTER — Encounter: Payer: Self-pay | Admitting: Internal Medicine

## 2016-01-03 ENCOUNTER — Other Ambulatory Visit (INDEPENDENT_AMBULATORY_CARE_PROVIDER_SITE_OTHER): Payer: BC Managed Care – PPO

## 2016-01-03 VITALS — BP 130/84 | HR 73 | Temp 98.3°F | Resp 16 | Wt 143.0 lb

## 2016-01-03 DIAGNOSIS — R7989 Other specified abnormal findings of blood chemistry: Secondary | ICD-10-CM | POA: Diagnosis not present

## 2016-01-03 DIAGNOSIS — G43909 Migraine, unspecified, not intractable, without status migrainosus: Secondary | ICD-10-CM

## 2016-01-03 DIAGNOSIS — I10 Essential (primary) hypertension: Secondary | ICD-10-CM

## 2016-01-03 DIAGNOSIS — Z0000 Encounter for general adult medical examination without abnormal findings: Secondary | ICD-10-CM | POA: Diagnosis not present

## 2016-01-03 DIAGNOSIS — Z780 Asymptomatic menopausal state: Secondary | ICD-10-CM | POA: Diagnosis not present

## 2016-01-03 LAB — CBC WITH DIFFERENTIAL/PLATELET
BASOS PCT: 0.6 % (ref 0.0–3.0)
Basophils Absolute: 0 10*3/uL (ref 0.0–0.1)
EOS PCT: 4.2 % (ref 0.0–5.0)
Eosinophils Absolute: 0.3 10*3/uL (ref 0.0–0.7)
HCT: 40.2 % (ref 36.0–46.0)
HEMOGLOBIN: 13.9 g/dL (ref 12.0–15.0)
LYMPHS ABS: 1.7 10*3/uL (ref 0.7–4.0)
Lymphocytes Relative: 25.4 % (ref 12.0–46.0)
MCHC: 34.5 g/dL (ref 30.0–36.0)
MCV: 98.4 fl (ref 78.0–100.0)
MONO ABS: 0.5 10*3/uL (ref 0.1–1.0)
Monocytes Relative: 7.5 % (ref 3.0–12.0)
NEUTROS ABS: 4.2 10*3/uL (ref 1.4–7.7)
Neutrophils Relative %: 62.3 % (ref 43.0–77.0)
Platelets: 269 10*3/uL (ref 150.0–400.0)
RBC: 4.08 Mil/uL (ref 3.87–5.11)
RDW: 12.8 % (ref 11.5–15.5)
WBC: 6.7 10*3/uL (ref 4.0–10.5)

## 2016-01-03 LAB — COMPREHENSIVE METABOLIC PANEL
ALBUMIN: 4.6 g/dL (ref 3.5–5.2)
ALK PHOS: 52 U/L (ref 39–117)
ALT: 18 U/L (ref 0–35)
AST: 14 U/L (ref 0–37)
BILIRUBIN TOTAL: 0.7 mg/dL (ref 0.2–1.2)
BUN: 22 mg/dL (ref 6–23)
CO2: 26 meq/L (ref 19–32)
CREATININE: 0.71 mg/dL (ref 0.40–1.20)
Calcium: 10.1 mg/dL (ref 8.4–10.5)
Chloride: 104 mEq/L (ref 96–112)
GFR: 88.8 mL/min (ref >60.00)
GLUCOSE: 104 mg/dL — AB (ref 70–99)
POTASSIUM: 4.2 meq/L (ref 3.5–5.1)
SODIUM: 137 meq/L (ref 135–145)
Total Protein: 7.6 g/dL (ref 6.0–8.3)

## 2016-01-03 LAB — LIPID PANEL
Cholesterol: 256 mg/dL — ABNORMAL HIGH (ref 0–200)
HDL: 56.7 mg/dL (ref >39.00)
NONHDL: 199.22
Total CHOL/HDL Ratio: 5
Triglycerides: 212 mg/dL — ABNORMAL HIGH (ref 0.0–149.0)
VLDL: 42.4 mg/dL — ABNORMAL HIGH (ref 0.0–40.0)

## 2016-01-03 LAB — LDL CHOLESTEROL, DIRECT: Direct LDL: 161 mg/dL

## 2016-01-03 LAB — TSH: TSH: 0.97 u[IU]/mL (ref 0.35–4.50)

## 2016-01-03 NOTE — Progress Notes (Signed)
Pre visit review using our clinic review tool, if applicable. No additional management support is needed unless otherwise documented below in the visit note. 

## 2016-01-03 NOTE — Assessment & Plan Note (Signed)
BP well controlled Current regimen effective and well tolerated Continue current medications at current doses Monitor BP at home periodically

## 2016-01-03 NOTE — Progress Notes (Signed)
Subjective:    Patient ID: Samantha Shepard, female    DOB: [birth year], 60 y.o.   MRN: [id number]  HPI She is here to establish with a new pcp.  She is here for a physical exam.   She coughs.  She has a chronic cough.  It is most likely from her asthma and allergies. She denies gerd.  Her cough was controlled when she used the dymista nasal spray, but this is not covered by her insurance.  She follows with an allergist.    She has no concerns.   Medications and allergies reviewed with patient and updated if appropriate.  Patient Active Problem List   Diagnosis Date Noted  . Postmenopausal 06/19/2013  . ASTHMA 08/15/2009  . Hyperlipidemia 08/13/2008  . Migraine headache 09/22/2007  . Essential hypertension 09/22/2007  . Allergic rhinitis 05/26/2007    Current Outpatient Prescriptions on File Prior to Visit  Medication Sig Dispense Refill  . Azelastine-Fluticasone (DYMISTA) 137-50 MCG/ACT SUSP Place 1 spray into the nose 2 (two) times daily. 23 g 5  . cetirizine (ZYRTEC) 10 MG tablet Take 10 mg by mouth daily.    Marland Kitchen estradiol-norethindrone (ACTIVELLA) 1-0.5 MG per tablet Take 1 tablet by mouth every other day. Reported on 10/26/2015    . losartan (COZAAR) 100 MG tablet TAKE 1 TABLET(100 MG) BY MOUTH DAILY 90 tablet 0  . norethindrone (JOLIVETTE) 0.35 MG tablet Take 1 tablet by mouth daily.    . SUMAtriptan (IMITREX) 100 MG tablet Take 1 tablet (100 mg total) by mouth every 2 (two) hours as needed for migraine. 10 tablet 0   No current facility-administered medications on file prior to visit.    Past Medical History  Diagnosis Date  . Allergy     dust, cats  . Hypertension   . Asthma   . Hyperlipidemia   . Migraines     Past Surgical History  Procedure Laterality Date  . Tonsillectomy    . Thyroid cyst aspirated    . Colonoscopy  2009    neg; [facility]    Social History   Social History  . Marital Status: Single    Spouse Name: N/A  . Number of Children: N/A   . Years of Education: N/A   Social History Main Topics  . Smoking status: Never Smoker   . Smokeless tobacco: None  . Alcohol Use: 4.2 oz/week    7 Glasses of wine per week     Comment: wine   . Drug Use: No  . Sexual Activity: Not Asked   Other Topics Concern  . None   Social History Narrative    Family History  Problem Relation Age of Onset  . Heart disease Mother     CABG 3 vessel @ 63  . Hypertension Mother   . Hyperlipidemia Mother   . Diabetes Mother   . Stroke Mother 75  . Prostate cancer Father   . Hypertension Father   . Breast cancer Sister   . Hyperlipidemia Sister     Review of Systems  Constitutional: Negative for fever, chills, fatigue and unexpected weight change.  HENT: Negative for hearing loss.   Eyes: Negative for visual disturbance.  Respiratory: Positive for cough (chronic), shortness of breath (mild - asthma, allergies) and wheezing.   Cardiovascular: Negative for chest pain, palpitations and leg swelling.  Gastrointestinal: Negative for nausea, abdominal pain, diarrhea, constipation and blood in stool.       Rare gerd  Genitourinary: Negative for  dysuria and hematuria.  Musculoskeletal: Negative for myalgias, back pain and arthralgias.  Neurological: Negative for dizziness, light-headedness and headaches.  Psychiatric/Behavioral: Negative for dysphoric mood. The patient is not nervous/anxious.        Objective:   Filed Vitals:   01/03/16 0819  BP: 130/84  Pulse: 73  Temp: 98.3 F (36.8 C)  Resp: 16   Filed Weights   01/03/16 0819  Weight: 143 lb (64.864 kg)   Body mass index is 26.15 kg/(m^2).   Physical Exam Constitutional: She appears well-developed and well-nourished. No distress.  HENT:  Head: Normocephalic and atraumatic.  Right Ear: External ear normal. Normal ear canal and TM Left Ear: External ear normal.  Normal ear canal and TM Mouth/Throat: Oropharynx is clear and moist.  Normal bilateral ear canals and tympanic  membranes  Eyes: Conjunctivae and EOM are normal.  Neck: Neck supple. No tracheal deviation present. No thyromegaly present.  No carotid bruit  Cardiovascular: Normal rate, regular rhythm and normal heart sounds.   No murmur heard.  No edema. Pulmonary/Chest: Effort normal and breath sounds normal. No respiratory distress. She has no wheezes. She has no rales.  Breast: deferred to Gyn Abdominal: Soft. She exhibits no distension. There is no tenderness.  Lymphadenopathy: She has no cervical adenopathy.  Skin: Skin is warm and dry. She is not diaphoretic.  Psychiatric: She has a normal mood and affect. Her behavior is normal.       Assessment & Plan:   Physical exam: Screening blood work ordered Immunizations - discussed shingles vaccine - deferred today Colonoscopy  Up to date  Mammogram  Up to date  Gyn  Up to date  Dexa  Ordered, never had one Eye exams -  Up to date EKG   - had an exercise tolerance test 12/2014, which showed no evidence of ischemia Exercise - exercising regularly, will continue Weight - work on weight loss - she knows what she needs to change - eats out too much Skin  - no concerns, changes Substance abuse - no evidence of substance abuse   See Problem List for Assessment and Plan of chronic medical problems.  Follow up annually

## 2016-01-03 NOTE — Patient Instructions (Addendum)
Can try xyzal for your allergies - it is similar to zyrtec.    Test(s) ordered today. Your results will be released to Earle (or called to you) after review, usually within 72hours after test completion. If any changes need to be made, you will be notified at that same time.  All other Health Maintenance issues reviewed.   All recommended immunizations and age-appropriate screenings are up-to-date or discussed.  No immunizations administered today.   Medications reviewed and updated.  No changes recommended at this time.  Your prescription(s) have been submitted to your pharmacy. Please take as directed and contact our office if you believe you are having problem(s) with the medication(s).   Please followup annually   Health Maintenance, Female Adopting a healthy lifestyle and getting preventive care can go a long way to promote health and wellness. Talk with your health care provider about what schedule of regular examinations is right for you. This is a good chance for you to check in with your provider about disease prevention and staying healthy. In between checkups, there are plenty of things you can do on your own. Experts have done a lot of research about which lifestyle changes and preventive measures are most likely to keep you healthy. Ask your health care provider for more information. WEIGHT AND DIET  Eat a healthy diet  Be sure to include plenty of vegetables, fruits, low-fat dairy products, and lean protein.  Do not eat a lot of foods high in solid fats, added sugars, or salt.  Get regular exercise. This is one of the most important things you can do for your health.  Most adults should exercise for at least 150 minutes each week. The exercise should increase your heart rate and make you sweat (moderate-intensity exercise).  Most adults should also do strengthening exercises at least twice a week. This is in addition to the moderate-intensity exercise.  Maintain a  healthy weight  Body mass index (BMI) is a measurement that can be used to identify possible weight problems. It estimates body fat based on height and weight. Your health care provider can help determine your BMI and help you achieve or maintain a healthy weight.  For females 61 years of age and older:   A BMI below 18.5 is considered underweight.  A BMI of 18.5 to 24.9 is normal.  A BMI of 25 to 29.9 is considered overweight.  A BMI of 30 and above is considered obese.  Watch levels of cholesterol and blood lipids  You should start having your blood tested for lipids and cholesterol at 62 years of age, then have this test every 5 years.  You may need to have your cholesterol levels checked more often if:  Your lipid or cholesterol levels are high.  You are older than 62 years of age.  You are at high risk for heart disease.  CANCER SCREENING   Lung Cancer  Lung cancer screening is recommended for adults 49-66 years old who are at high risk for lung cancer because of a history of smoking.  A yearly low-dose CT scan of the lungs is recommended for people who:  Currently smoke.  Have quit within the past 15 years.  Have at least a 30-pack-year history of smoking. A pack year is smoking an average of one pack of cigarettes a day for 1 year.  Yearly screening should continue until it has been 15 years since you quit.  Yearly screening should stop if you develop a health  problem that would prevent you from having lung cancer treatment.  Breast Cancer  Practice breast self-awareness. This means understanding how your breasts normally appear and feel.  It also means doing regular breast self-exams. Let your health care provider know about any changes, no matter how small.  If you are in your 20s or 30s, you should have a clinical breast exam (CBE) by a health care provider every 1-3 years as part of a regular health exam.  If you are 58 or older, have a CBE every year.  Also consider having a breast X-ray (mammogram) every year.  If you have a family history of breast cancer, talk to your health care provider about genetic screening.  If you are at high risk for breast cancer, talk to your health care provider about having an MRI and a mammogram every year.  Breast cancer gene (BRCA) assessment is recommended for women who have family members with BRCA-related cancers. BRCA-related cancers include:  Breast.  Ovarian.  Tubal.  Peritoneal cancers.  Results of the assessment will determine the need for genetic counseling and BRCA1 and BRCA2 testing. Cervical Cancer Your health care provider may recommend that you be screened regularly for cancer of the pelvic organs (ovaries, uterus, and vagina). This screening involves a pelvic examination, including checking for microscopic changes to the surface of your cervix (Pap test). You may be encouraged to have this screening done every 3 years, beginning at age 42.  For women ages 55-65, health care providers may recommend pelvic exams and Pap testing every 3 years, or they may recommend the Pap and pelvic exam, combined with testing for human papilloma virus (HPV), every 5 years. Some types of HPV increase your risk of cervical cancer. Testing for HPV may also be done on women of any age with unclear Pap test results.  Other health care providers may not recommend any screening for nonpregnant women who are considered low risk for pelvic cancer and who do not have symptoms. Ask your health care provider if a screening pelvic exam is right for you.  If you have had past treatment for cervical cancer or a condition that could lead to cancer, you need Pap tests and screening for cancer for at least 20 years after your treatment. If Pap tests have been discontinued, your risk factors (such as having a new sexual partner) need to be reassessed to determine if screening should resume. Some women have medical problems that  increase the chance of getting cervical cancer. In these cases, your health care provider may recommend more frequent screening and Pap tests. Colorectal Cancer  This type of cancer can be detected and often prevented.  Routine colorectal cancer screening usually begins at 62 years of age and continues through 62 years of age.  Your health care provider may recommend screening at an earlier age if you have risk factors for colon cancer.  Your health care provider may also recommend using home test kits to check for hidden blood in the stool.  A small camera at the end of a tube can be used to examine your colon directly (sigmoidoscopy or colonoscopy). This is done to check for the earliest forms of colorectal cancer.  Routine screening usually begins at age 66.  Direct examination of the colon should be repeated every 5-10 years through 62 years of age. However, you may need to be screened more often if early forms of precancerous polyps or small growths are found. Skin Cancer  Check your  skin from head to toe regularly.  Tell your health care provider about any new moles or changes in moles, especially if there is a change in a mole's shape or color.  Also tell your health care provider if you have a mole that is larger than the size of a pencil eraser.  Always use sunscreen. Apply sunscreen liberally and repeatedly throughout the day.  Protect yourself by wearing long sleeves, pants, a wide-brimmed hat, and sunglasses whenever you are outside. HEART DISEASE, DIABETES, AND HIGH BLOOD PRESSURE   High blood pressure causes heart disease and increases the risk of stroke. High blood pressure is more likely to develop in:  People who have blood pressure in the high end of the normal range (130-139/85-89 mm Hg).  People who are overweight or obese.  People who are African American.  If you are 34-32 years of age, have your blood pressure checked every 3-5 years. If you are 36 years of  age or older, have your blood pressure checked every year. You should have your blood pressure measured twice--once when you are at a hospital or clinic, and once when you are not at a hospital or clinic. Record the average of the two measurements. To check your blood pressure when you are not at a hospital or clinic, you can use:  An automated blood pressure machine at a pharmacy.  A home blood pressure monitor.  If you are between 97 years and 74 years old, ask your health care provider if you should take aspirin to prevent strokes.  Have regular diabetes screenings. This involves taking a blood sample to check your fasting blood sugar level.  If you are at a normal weight and have a low risk for diabetes, have this test once every three years after 62 years of age.  If you are overweight and have a high risk for diabetes, consider being tested at a younger age or more often. PREVENTING INFECTION  Hepatitis B  If you have a higher risk for hepatitis B, you should be screened for this virus. You are considered at high risk for hepatitis B if:  You were born in a country where hepatitis B is common. Ask your health care provider which countries are considered high risk.  Your parents were born in a high-risk country, and you have not been immunized against hepatitis B (hepatitis B vaccine).  You have HIV or AIDS.  You use needles to inject street drugs.  You live with someone who has hepatitis B.  You have had sex with someone who has hepatitis B.  You get hemodialysis treatment.  You take certain medicines for conditions, including cancer, organ transplantation, and autoimmune conditions. Hepatitis C  Blood testing is recommended for:  Everyone born from [birth year] through 1967.  Anyone with known risk factors for hepatitis C. Sexually transmitted infections (STIs)  You should be screened for sexually transmitted infections (STIs) including gonorrhea and chlamydia if:  You are  sexually active and are younger than 62 years of age.  You are older than 61 years of age and your health care provider tells you that you are at risk for this type of infection.  Your sexual activity has changed since you were last screened and you are at an increased risk for chlamydia or gonorrhea. Ask your health care provider if you are at risk.  If you do not have HIV, but are at risk, it may be recommended that you take a prescription medicine daily to  prevent HIV infection. This is called pre-exposure prophylaxis (PrEP). You are considered at risk if:  You are sexually active and do not regularly use condoms or know the HIV status of your partner(s).  You take drugs by injection.  You are sexually active with a partner who has HIV. Talk with your health care provider about whether you are at high risk of being infected with HIV. If you choose to begin PrEP, you should first be tested for HIV. You should then be tested every 3 months for as long as you are taking PrEP.  PREGNANCY   If you are premenopausal and you may become pregnant, ask your health care provider about preconception counseling.  If you may become pregnant, take 400 to 800 micrograms (mcg) of folic acid every day.  If you want to prevent pregnancy, talk to your health care provider about birth control (contraception). OSTEOPOROSIS AND MENOPAUSE   Osteoporosis is a disease in which the bones lose minerals and strength with aging. This can result in serious bone fractures. Your risk for osteoporosis can be identified using a bone density scan.  If you are 61 years of age or older, or if you are at risk for osteoporosis and fractures, ask your health care provider if you should be screened.  Ask your health care provider whether you should take a calcium or vitamin D supplement to lower your risk for osteoporosis.  Menopause may have certain physical symptoms and risks.  Hormone replacement therapy may reduce some  of these symptoms and risks. Talk to your health care provider about whether hormone replacement therapy is right for you.  HOME CARE INSTRUCTIONS   Schedule regular health, dental, and eye exams.  Stay current with your immunizations.   Do not use any tobacco products including cigarettes, chewing tobacco, or electronic cigarettes.  If you are pregnant, do not drink alcohol.  If you are breastfeeding, limit how much and how often you drink alcohol.  Limit alcohol intake to no more than 1 drink per day for nonpregnant women. One drink equals 12 ounces of beer, 5 ounces of wine, or 1 ounces of hard liquor.  Do not use street drugs.  Do not share needles.  Ask your health care provider for help if you need support or information about quitting drugs.  Tell your health care provider if you often feel depressed.  Tell your health care provider if you have ever been abused or do not feel safe at home.   This information is not intended to replace advice given to you by your health care provider. Make sure you discuss any questions you have with your health care provider.   Document Released: 04/09/2011 Document Revised: 10/15/2014 Document Reviewed: 08/26/2013 Elsevier Interactive Patient Education [employer].

## 2016-01-03 NOTE — Assessment & Plan Note (Signed)
Migraines almost completely resolved Last used imitrex years ago

## 2016-01-09 ENCOUNTER — Ambulatory Visit (INDEPENDENT_AMBULATORY_CARE_PROVIDER_SITE_OTHER)
Admission: RE | Admit: 2016-01-09 | Discharge: 2016-01-09 | Disposition: A | Discharge status: Home / Self Care | Payer: BC Managed Care – PPO | Source: Ambulatory Visit | Attending: Internal Medicine | Admitting: Internal Medicine

## 2016-01-09 DIAGNOSIS — Z780 Asymptomatic menopausal state: Secondary | ICD-10-CM | POA: Diagnosis not present

## 2016-01-10 ENCOUNTER — Ambulatory Visit (INDEPENDENT_AMBULATORY_CARE_PROVIDER_SITE_OTHER): Payer: BC Managed Care – PPO | Admitting: Pediatrics

## 2016-01-10 ENCOUNTER — Encounter: Payer: Self-pay | Admitting: Pediatrics

## 2016-01-10 ENCOUNTER — Telehealth: Payer: Self-pay | Admitting: Emergency Medicine

## 2016-01-10 VITALS — BP 120/80 | HR 68 | Temp 98.3°F | Resp 16 | Ht 61.81 in | Wt 143.7 lb

## 2016-01-10 DIAGNOSIS — I10 Essential (primary) hypertension: Secondary | ICD-10-CM | POA: Diagnosis not present

## 2016-01-10 DIAGNOSIS — J3089 Other allergic rhinitis: Secondary | ICD-10-CM | POA: Diagnosis not present

## 2016-01-10 DIAGNOSIS — J4530 Mild persistent asthma, uncomplicated: Secondary | ICD-10-CM

## 2016-01-10 DIAGNOSIS — E7800 Pure hypercholesterolemia, unspecified: Secondary | ICD-10-CM

## 2016-01-10 MED ORDER — ALBUTEROL SULFATE HFA 108 (90 BASE) MCG/ACT IN AERS: 2.0000 | INHALATION_SPRAY | RESPIRATORY_TRACT | Status: DC | PRN

## 2016-01-10 MED ORDER — MOMETASONE FUROATE 220 MCG/INH IN AEPB: 2.0000 | INHALATION_SPRAY | Freq: Every day | RESPIRATORY_TRACT | Status: DC

## 2016-01-10 MED ORDER — AZELASTINE-FLUTICASONE 137-50 MCG/ACT NA SUSP: 2.0000 | Freq: Every day | NASAL | Status: DC

## 2016-01-10 NOTE — Telephone Encounter (Signed)
Pt okay with having blood work rechecked in 3 months. Orders have been entered.

## 2016-01-10 NOTE — Progress Notes (Signed)
[place] [address] Dept: [phone]  FOLLOW UP NOTE  Patient ID: Samantha Shepard, female    DOB: [birth year]  Age: 60 y.o. MRN: [id number] Date of Office Visit: 01/10/2016  Assessment Chief Complaint: Follow-up; Cough; Wheezing; and Nasal Congestion  HPI [name] presents for follow-up of asthma and allergic rhinitis. She has been coughing for couple of months. She could not afford Asmanex 220.Marland Kitchen She is allergic to some molds. At times she does have nasal congestion  Current medications are Proventil 2 puffs every 4 hours if needed, cetirizine 10 mg once a day, Dymista 2 sprays per nostril once a day. Her other medications are outlined in the chart.   Drug Allergies:  Allergies  Allergen Reactions  . Sulfonamide Derivatives     REACTION: rash  . Codeine     REACTION: Excitability--can't sleep    Physical Exam: BP 120/80 mmHg  Pulse 68  Temp(Src) 98.3 F (36.8 C) (Oral)  Resp 16  Ht 5' 1.81" (1.57 m)  Wt 143 lb 11.8 oz (65.2 kg)  BMI 26.45 kg/m2   Physical Exam  Constitutional: She is oriented to person, place, and time. She appears well-developed and well-nourished.  HENT:  Eyes normal. Ears normal. Nose normal. Pharynx normal.  Neck: Neck supple.  Cardiovascular:  S1 and S2 normal no murmurs  Pulmonary/Chest:  Clear to percussion and auscultation  Lymphadenopathy:    She has no cervical adenopathy.  Neurological: She is alert and oriented to person, place, and time.  Psychiatric: She has a normal mood and affect. Her behavior is normal. Judgment and thought content normal.  Vitals reviewed.   Diagnostics:  FVC 2.86 L FEV1 1.88 L. Predicted FVC 3.02 L predicted FEV1 2.32 L-this shows a mild reduction in the FVC  Assessment and Plan: 1. Mild persistent asthma, uncomplicated   2. Other allergic rhinitis   3. Essential hypertension     Meds ordered this encounter  Medications  . albuterol (PROVENTIL HFA) 108 (90 Base)  MCG/ACT inhaler    Sig: Inhale 2 puffs into the lungs every 4 (four) hours as needed for wheezing or shortness of breath.    Dispense:  1 Inhaler    Refill:  0  . Azelastine-Fluticasone (DYMISTA) 137-50 MCG/ACT SUSP    Sig: Place 2 sprays into the nose daily.    Dispense:  1 Bottle    Refill:  5  . mometasone (ASMANEX 60 METERED DOSES) 220 MCG/INH inhaler    Sig: Inhale 2 puffs into the lungs daily.    Dispense:  1 Inhaler    Refill:  3    Patient Instructions  Asmanex 220-2 puffs once a day to prevent coughing and wheezing. In 2 weeks if you're cough or wheeze free you may decrease Asmanex 220 one puff once a day Proventil 2 puffs every 4 hours if needed for wheezing or coughing spells Dymista 2 sprays per nostril once a day for stuffy nose if needed Cetirizine 10 mg once a day if needed for runny nose Add prednisone 10 mg twice a day for 4 days, 10 mg on the fifth day to bring your asthma under control    Return in about 1 year (around 01/09/2017).    Thank you for the opportunity to care for this patient.  Please do not hesitate to contact me with questions.  Penne Lash, M.D.  [facility] [address] [place], [place] [address] [phone]

## 2016-01-10 NOTE — Patient Instructions (Signed)
Asmanex 220-2 puffs once a day to prevent coughing and wheezing. In 2 weeks if you're cough or wheeze free you may decrease Asmanex 220 one puff once a day Proventil 2 puffs every 4 hours if needed for wheezing or coughing spells Dymista 2 sprays per nostril once a day for stuffy nose if needed Cetirizine 10 mg once a day if needed for runny nose Add prednisone 10 mg twice a day for 4 days, 10 mg on the fifth day to bring your asthma under control

## 2016-01-15 ENCOUNTER — Encounter: Payer: Self-pay | Admitting: Internal Medicine

## 2016-01-15 DIAGNOSIS — M8580 Other specified disorders of bone density and structure, unspecified site: Secondary | ICD-10-CM | POA: Insufficient documentation

## 2016-03-15 ENCOUNTER — Other Ambulatory Visit: Payer: Self-pay | Admitting: Internal Medicine

## 2016-04-18 ENCOUNTER — Encounter: Payer: Self-pay | Admitting: Internal Medicine

## 2016-04-18 ENCOUNTER — Ambulatory Visit (INDEPENDENT_AMBULATORY_CARE_PROVIDER_SITE_OTHER): Payer: BC Managed Care – PPO | Admitting: Internal Medicine

## 2016-04-18 VITALS — BP 124/72 | HR 81 | Temp 98.2°F | Resp 16 | Ht 62.0 in | Wt 134.0 lb

## 2016-04-18 DIAGNOSIS — R202 Paresthesia of skin: Secondary | ICD-10-CM | POA: Diagnosis not present

## 2016-04-18 DIAGNOSIS — R739 Hyperglycemia, unspecified: Secondary | ICD-10-CM | POA: Insufficient documentation

## 2016-04-18 DIAGNOSIS — R200 Anesthesia of skin: Secondary | ICD-10-CM

## 2016-04-18 NOTE — Patient Instructions (Addendum)
Monitor your symptoms and call if they worsen or do not improve.

## 2016-04-18 NOTE — Progress Notes (Signed)
Pre visit review using our clinic review tool, if applicable. No additional management support is needed unless otherwise documented below in the visit note. 

## 2016-04-18 NOTE — Assessment & Plan Note (Signed)
Recheck a1c, fasting glucose Has been walking and eating more healthy - has lost weight

## 2016-04-18 NOTE — Progress Notes (Signed)
Subjective:    Patient ID: Samantha Shepard, female    DOB: [birth year], 60 y.o.   MRN: [id number]  HPI  She is here for an acute visit.   She has had tingling in left lower leg mid way down leg through her foot.  It started three weeks ago.  She denies any trauma to the leg.   The tingling is intermittent and has gotten better. She has shooting pain on occasion in the leg.  She has tightness in the area, but denies swelling or calf pain.  She has occaisonal back pain, but that is chronic and unchanged.  She has had similar symptoms in his right leg years ago and it was related to her back, but this feels a little different.   She was concerned about a blood clot. She has not had any prolonged inactivity.  She denies other symptoms.  Medications and allergies reviewed with patient and updated if appropriate.  Patient Active Problem List   Diagnosis Date Noted  . Osteopenia 01/15/2016  . Mild persistent asthma 01/10/2016  . Other allergic rhinitis 01/10/2016  . Postmenopausal 06/19/2013  . ASTHMA 08/15/2009  . Hyperlipidemia 08/13/2008  . Migraine headache 09/22/2007  . Essential hypertension 09/22/2007  . Allergic rhinitis 05/26/2007    Current Outpatient Prescriptions on File Prior to Visit  Medication Sig Dispense Refill  . albuterol (PROVENTIL HFA) 108 (90 Base) MCG/ACT inhaler Inhale 2 puffs into the lungs every 4 (four) hours as needed for wheezing or shortness of breath. 1 Inhaler 0  . Azelastine-Fluticasone (DYMISTA) 137-50 MCG/ACT SUSP Place 2 sprays into the nose daily. 1 Bottle 5  . cetirizine (ZYRTEC) 10 MG tablet Take 10 mg by mouth daily. Reported on 01/10/2016    . estradiol-norethindrone (ACTIVELLA) 1-0.5 MG per tablet Take 1 tablet by mouth every other day. Reported on 10/26/2015    . losartan (COZAAR) 100 MG tablet TAKE 1 TABLET(100 MG) BY MOUTH DAILY 90 tablet 3  . mometasone (ASMANEX 30 METERED DOSES) 220 MCG/INH inhaler Inhale 2 puffs into the lungs daily.    .  mometasone (ASMANEX 60 METERED DOSES) 220 MCG/INH inhaler Inhale 2 puffs into the lungs daily. 1 Inhaler 3  . norethindrone (JOLIVETTE) 0.35 MG tablet Take 1 tablet by mouth daily.    . SUMAtriptan (IMITREX) 100 MG tablet Take 1 tablet (100 mg total) by mouth every 2 (two) hours as needed for migraine. 10 tablet 0   No current facility-administered medications on file prior to visit.    Past Medical History  Diagnosis Date  . Allergy     dust, cats  . Hypertension   . Asthma   . Hyperlipidemia   . Migraines     Past Surgical History  Procedure Laterality Date  . Tonsillectomy    . Thyroid cyst aspirated    . Colonoscopy  2009    neg; [facility]    Social History   Social History  . Marital Status: Single    Spouse Name: N/A  . Number of Children: N/A  . Years of Education: N/A   Social History Main Topics  . Smoking status: Never Smoker   . Smokeless tobacco: None  . Alcohol Use: 4.2 oz/week    7 Glasses of wine per week     Comment: wine   . Drug Use: No  . Sexual Activity: Not Asked   Other Topics Concern  . None   Social History Narrative   Exercise: regular  Family History  Problem Relation Age of Onset  . Heart disease Mother     CABG 3 vessel @ 2  . Hypertension Mother   . Hyperlipidemia Mother   . Diabetes Mother   . Stroke Mother 44  . Prostate cancer Father   . Hypertension Father   . Breast cancer Sister   . Hyperlipidemia Sister     Review of Systems  Constitutional: Negative for fever.  Respiratory: Negative for shortness of breath.   Cardiovascular: Negative for chest pain and leg swelling.  Musculoskeletal: Positive for back pain (chornic).  Skin: Negative for color change and rash.  Neurological: Positive for numbness. Negative for weakness.       Objective:   Filed Vitals:   04/18/16 1621  BP: 124/72  Pulse: 81  Temp: 98.2 F (36.8 C)  Resp: 16   Filed Weights   04/18/16 1621  Weight: 134 lb (60.782 kg)   Body  mass index is 24.5 kg/(m^2).   Physical Exam  Constitutional: She appears well-developed and well-nourished. No distress.  Musculoskeletal: She exhibits no edema.  No calf tenderness  Neurological:  Slight altered sensation left anterior lower leg  Skin: Skin is warm and dry. No rash noted. She is not diaphoretic. No erythema.        Assessment & Plan:   See Problem List for Assessment and Plan of chronic medical problems.

## 2016-04-18 NOTE — Assessment & Plan Note (Signed)
Likely related to lumbar radiculopathy, she is very low risk for a dvt No symptoms concerning for a dvt - her symptpoms are consistent with lumbar radiculopathy Her symptoms have improved Conservative treatment Call or return is symptoms do not improve or worsen

## 2016-06-08 ENCOUNTER — Other Ambulatory Visit (INDEPENDENT_AMBULATORY_CARE_PROVIDER_SITE_OTHER): Payer: BC Managed Care – PPO

## 2016-06-08 DIAGNOSIS — R739 Hyperglycemia, unspecified: Secondary | ICD-10-CM | POA: Diagnosis not present

## 2016-06-08 LAB — LIPID PANEL
CHOL/HDL RATIO: 5
Cholesterol: 232 mg/dL — ABNORMAL HIGH (ref 0–200)
HDL: 50.3 mg/dL (ref >39.00)
LDL Cholesterol: 155 mg/dL — ABNORMAL HIGH (ref 0–99)
NONHDL: 181.25
Triglycerides: 132 mg/dL (ref 0.0–149.0)
VLDL: 26.4 mg/dL (ref 0.0–40.0)

## 2016-06-08 LAB — COMPREHENSIVE METABOLIC PANEL
ALT: 13 U/L (ref 0–35)
AST: 12 U/L (ref 0–37)
Albumin: 4.3 g/dL (ref 3.5–5.2)
Alkaline Phosphatase: 54 U/L (ref 39–117)
BUN: 20 mg/dL (ref 6–23)
CO2: 25 meq/L (ref 19–32)
Calcium: 9.5 mg/dL (ref 8.4–10.5)
Chloride: 105 mEq/L (ref 96–112)
Creatinine, Ser: 0.81 mg/dL (ref 0.40–1.20)
GFR: 76.16 mL/min (ref >60.00)
GLUCOSE: 88 mg/dL (ref 70–99)
Potassium: 3.9 mEq/L (ref 3.5–5.1)
Sodium: 138 mEq/L (ref 135–145)
Total Bilirubin: 0.5 mg/dL (ref 0.2–1.2)
Total Protein: 7.2 g/dL (ref 6.0–8.3)

## 2016-06-08 LAB — HEMOGLOBIN A1C: Hgb A1c MFr Bld: 5.6 % (ref 4.6–6.5)

## 2016-07-30 ENCOUNTER — Other Ambulatory Visit: Payer: Self-pay | Admitting: Obstetrics & Gynecology

## 2016-08-06 ENCOUNTER — Other Ambulatory Visit: Payer: Self-pay | Admitting: Obstetrics & Gynecology

## 2016-08-06 DIAGNOSIS — Z1231 Encounter for screening mammogram for malignant neoplasm of breast: Secondary | ICD-10-CM

## 2016-09-03 ENCOUNTER — Ambulatory Visit
Admission: RE | Admit: 2016-09-03 | Discharge: 2016-09-03 | Disposition: A | Discharge status: Home / Self Care | Payer: BC Managed Care – PPO | Source: Ambulatory Visit | Attending: Obstetrics & Gynecology | Admitting: Obstetrics & Gynecology

## 2016-09-03 DIAGNOSIS — Z1231 Encounter for screening mammogram for malignant neoplasm of breast: Secondary | ICD-10-CM

## 2016-09-05 ENCOUNTER — Other Ambulatory Visit: Payer: Self-pay | Admitting: Obstetrics & Gynecology

## 2016-09-05 DIAGNOSIS — R928 Other abnormal and inconclusive findings on diagnostic imaging of breast: Secondary | ICD-10-CM

## 2016-09-06 NOTE — Progress Notes (Signed)
Subjective:    Patient ID: Samantha Shepard, female    DOB: [birth year], 60 y.o.   MRN: [id number]  HPI She is here for an acute visit due to her BP running high.   She has had some increased stress-she goes to her parents once a week to help care for them.    Hypertension: She is taking her medication daily. Her BP has been more elevated over the past two months.  It gets up to the 150'/90's. She is compliant with a low sodium diet.  She denies chest pain, palpitations, edema, shortness of breath and regular headaches. She is exercising regularly at the gym.     Cold symptoms: Her symptoms started over one week ago.  She is taking mucinex which is not helping.  She is experiencing nasal congestion, postnasal drip, cough that is occasionally productive, possible mild wheeze first thing in the morning or at night and some lightheadedness. She denies any fevers, ear pain, sinus pain or pressure, sore throat and shortness of breath.  She has not needed her inhaler.  Medications and allergies reviewed with patient and updated if appropriate.  Patient Active Problem List   Diagnosis Date Noted  . Hyperglycemia 04/18/2016  . Numbness and tingling of leg 04/18/2016  . Osteopenia 01/15/2016  . Mild persistent asthma 01/10/2016  . Other allergic rhinitis 01/10/2016  . Postmenopausal 06/19/2013  . ASTHMA 08/15/2009  . Hyperlipidemia 08/13/2008  . Migraine headache 09/22/2007  . Essential hypertension 09/22/2007  . Allergic rhinitis 05/26/2007    Current Outpatient Prescriptions on File Prior to Visit  Medication Sig Dispense Refill  . albuterol (PROVENTIL HFA) 108 (90 Base) MCG/ACT inhaler Inhale 2 puffs into the lungs every 4 (four) hours as needed for wheezing or shortness of breath. 1 Inhaler 0  . Azelastine-Fluticasone (DYMISTA) 137-50 MCG/ACT SUSP Place 2 sprays into the nose daily. 1 Bottle 5  . cetirizine (ZYRTEC) 10 MG tablet Take 10 mg by mouth daily. Reported on 01/10/2016    .  losartan (COZAAR) 100 MG tablet TAKE 1 TABLET(100 MG) BY MOUTH DAILY 90 tablet 3  . mometasone (ASMANEX 60 METERED DOSES) 220 MCG/INH inhaler Inhale 2 puffs into the lungs daily. 1 Inhaler 3  . norethindrone (JOLIVETTE) 0.35 MG tablet Take 1 tablet by mouth daily.     No current facility-administered medications on file prior to visit.     Past Medical History:  Diagnosis Date  . Allergy    dust, cats  . Asthma   . Hyperlipidemia   . Hypertension   . Migraines     Past Surgical History:  Procedure Laterality Date  . COLONOSCOPY  2009   neg; [facility]  . thyroid cyst aspirated    . TONSILLECTOMY      Social History   Social History  . Marital status: Single    Spouse name: N/A  . Number of children: N/A  . Years of education: N/A   Social History Main Topics  . Smoking status: Never Smoker  . Smokeless tobacco: Not on file  . Alcohol use 4.2 oz/week    7 Glasses of wine per week     Comment: wine   . Drug use: No  . Sexual activity: Not on file   Other Topics Concern  . Not on file   Social History Narrative   Exercise: regular    Family History  Problem Relation Age of Onset  . Heart disease Mother     CABG 3  vessel @ 70  . Hypertension Mother   . Hyperlipidemia Mother   . Diabetes Mother   . Stroke Mother 90  . Prostate cancer Father   . Hypertension Father   . Breast cancer Sister   . Hyperlipidemia Sister     Review of Systems  Constitutional: Negative for fever.  HENT: Positive for congestion and postnasal drip. Negative for ear pain (ear pressure/plugged), sinus pain, sinus pressure and sore throat.   Respiratory: Positive for cough (productive at times) and wheezing (? little at night). Negative for shortness of breath.   Cardiovascular: Negative for chest pain, palpitations and leg swelling.  Neurological: Positive for light-headedness (with cold symptoms). Negative for dizziness and headaches.       Objective:   Vitals:   09/07/16  1103  BP: 130/86  Pulse: 70  Resp: 16  Temp: 98.2 F (36.8 C)   Filed Weights   09/07/16 1103  Weight: 136 lb (61.7 kg)   Body mass index is 24.87 kg/m.   Physical Exam GENERAL APPEARANCE: Appears stated age, well appearing, NAD EYES: conjunctiva clear, no icterus HEENT: bilateral tympanic membranes and ear canals normal, oropharynx with no erythema, no thyromegaly, trachea midline, no cervical or supraclavicular lymphadenopathy LUNGS: Clear to auscultation without wheeze or crackles, unlabored breathing, good air entry bilaterally HEART: Normal S1,S2 without murmurs EXTREMITIES: Without clubbing, cyanosis, or edema      Assessment & Plan:   See Problem List for Assessment and Plan of chronic medical problems.

## 2016-09-07 ENCOUNTER — Encounter: Payer: Self-pay | Admitting: Internal Medicine

## 2016-09-07 ENCOUNTER — Ambulatory Visit (INDEPENDENT_AMBULATORY_CARE_PROVIDER_SITE_OTHER): Payer: BC Managed Care – PPO | Admitting: Internal Medicine

## 2016-09-07 VITALS — BP 130/86 | HR 70 | Temp 98.2°F | Resp 16 | Wt 136.0 lb

## 2016-09-07 DIAGNOSIS — I10 Essential (primary) hypertension: Secondary | ICD-10-CM | POA: Diagnosis not present

## 2016-09-07 DIAGNOSIS — J069 Acute upper respiratory infection, unspecified: Secondary | ICD-10-CM

## 2016-09-07 DIAGNOSIS — B9789 Other viral agents as the cause of diseases classified elsewhere: Secondary | ICD-10-CM

## 2016-09-07 MED ORDER — AMLODIPINE BESYLATE 5 MG PO TABS: 5.0000 mg | ORAL_TABLET | Freq: Every day | ORAL | 3 refills | Status: DC

## 2016-09-07 MED ORDER — AZELASTINE-FLUTICASONE 137-50 MCG/ACT NA SUSP: 2.0000 | Freq: Every day | NASAL | 5 refills | Status: DC

## 2016-09-07 MED ORDER — AMLODIPINE BESYLATE 5 MG PO TABS: 2.5000 mg | ORAL_TABLET | Freq: Every day | ORAL | 3 refills | Status: DC

## 2016-09-07 NOTE — Assessment & Plan Note (Signed)
Blood pressure is not ideally controlled Continue losartan 100 mg daily Start amlodipine 2.5 mg daily-increase to 5 mg daily if blood pressure is not controlled She will continue to monitor closely and let me know if her blood pressure is not controlled Continue regular exercise, low-sodium diet

## 2016-09-07 NOTE — Progress Notes (Signed)
Pre visit review using our clinic review tool, if applicable. No additional management support is needed unless otherwise documented below in the visit note. 

## 2016-09-07 NOTE — Assessment & Plan Note (Addendum)
Just over one week of cold symptoms-likely viral in nature No antibiotic needed No evidence of asthma exacerbation Can continue Mucinex if that helps Start Dymista nasal spray Rest, fluids and over-the-counter cold medications Call if no improvement or any worsening in symptoms

## 2016-09-07 NOTE — Patient Instructions (Addendum)
  Medications reviewed and updated.  Changes include starting amlodipine 2.5 mg daily for your blood pressure.  Monitor and let me know if your BP is not controlled.   Your prescription(s) have been submitted to your pharmacy. Please take as directed and contact our office if you believe you are having problem(s) with the medication(s).

## 2016-09-10 ENCOUNTER — Ambulatory Visit
Admission: RE | Admit: 2016-09-10 | Discharge: 2016-09-10 | Disposition: A | Discharge status: Home / Self Care | Payer: BC Managed Care – PPO | Source: Ambulatory Visit | Attending: Obstetrics & Gynecology | Admitting: Obstetrics & Gynecology

## 2016-09-10 DIAGNOSIS — R928 Other abnormal and inconclusive findings on diagnostic imaging of breast: Secondary | ICD-10-CM

## 2016-09-17 ENCOUNTER — Telehealth: Payer: Self-pay | Admitting: Internal Medicine

## 2016-09-17 MED ORDER — CEPHALEXIN 500 MG PO CAPS: 500.0000 mg | ORAL_CAPSULE | Freq: Three times a day (TID) | ORAL | 0 refills | Status: DC

## 2016-09-17 NOTE — Telephone Encounter (Signed)
Patient states she was in two weeks ago Friday.  States that DR. Burns told her if she was not feeling any better to give her a call to get medication called into her pharmacy.  Patient uses [facility] on [address]

## 2016-09-17 NOTE — Telephone Encounter (Signed)
Please advise 

## 2016-09-17 NOTE — Telephone Encounter (Signed)
SPoke with pt to clarify.

## 2016-09-17 NOTE — Telephone Encounter (Signed)
Antibiotic pending -  Is [facility] correct?

## 2017-01-17 ENCOUNTER — Encounter: Payer: Self-pay | Admitting: Internal Medicine

## 2017-03-18 ENCOUNTER — Other Ambulatory Visit: Payer: Self-pay | Admitting: Internal Medicine

## 2017-06-18 ENCOUNTER — Encounter: Payer: Self-pay | Admitting: Internal Medicine

## 2017-06-18 ENCOUNTER — Ambulatory Visit (INDEPENDENT_AMBULATORY_CARE_PROVIDER_SITE_OTHER): Payer: BC Managed Care – PPO | Admitting: Internal Medicine

## 2017-06-18 VITALS — BP 140/84 | HR 80 | Temp 98.0°F | Resp 16 | Wt 143.0 lb

## 2017-06-18 DIAGNOSIS — G43909 Migraine, unspecified, not intractable, without status migrainosus: Secondary | ICD-10-CM

## 2017-06-18 DIAGNOSIS — E785 Hyperlipidemia, unspecified: Secondary | ICD-10-CM

## 2017-06-18 DIAGNOSIS — R739 Hyperglycemia, unspecified: Secondary | ICD-10-CM | POA: Diagnosis not present

## 2017-06-18 DIAGNOSIS — J4530 Mild persistent asthma, uncomplicated: Secondary | ICD-10-CM

## 2017-06-18 DIAGNOSIS — I10 Essential (primary) hypertension: Secondary | ICD-10-CM

## 2017-06-18 DIAGNOSIS — J309 Allergic rhinitis, unspecified: Secondary | ICD-10-CM

## 2017-06-18 MED ORDER — LOSARTAN POTASSIUM 100 MG PO TABS: 100.0000 mg | ORAL_TABLET | Freq: Every day | ORAL | 3 refills | Status: DC

## 2017-06-18 MED ORDER — AZELASTINE-FLUTICASONE 137-50 MCG/ACT NA SUSP: 2.0000 | Freq: Every day | NASAL | 5 refills | Status: DC

## 2017-06-18 MED ORDER — AMLODIPINE BESYLATE 5 MG PO TABS: 2.5000 mg | ORAL_TABLET | Freq: Every day | ORAL | 3 refills | Status: DC
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
## 2017-06-18 NOTE — Assessment & Plan Note (Signed)
Has imitrex but has not had a migraine in years

## 2017-06-18 NOTE — Patient Instructions (Addendum)
  Test(s) ordered today. Your results will be released to MyChart (or called to you) after review, usually within 72hours after test completion. If any changes need to be made, you will be notified at that same time.  All other Health Maintenance issues reviewed.   All recommended immunizations and age-appropriate screenings are up-to-date or discussed.  No immunizations administered today.   Medications reviewed and updated.  No changes recommended at this time.  Your prescription(s) have been submitted to your pharmacy. Please take as directed and contact our office if you believe you are having problem(s) with the medication(s).   Please followup in one year   

## 2017-06-18 NOTE — Assessment & Plan Note (Signed)
Check a1c Low sugar / carb diet Stressed regular exercise   

## 2017-06-18 NOTE — Assessment & Plan Note (Signed)
Elevated here slightly but typically well controlled at home Continue current medications at current doses Cmp, tsh, cbc Start regular exercise

## 2017-06-18 NOTE — Assessment & Plan Note (Signed)
dymista prn - refillled today

## 2017-06-18 NOTE — Assessment & Plan Note (Signed)
Uses asmanex and albuterol only as needed Not currently using No symptoms now  Continue inhalers as needed

## 2017-06-18 NOTE — Assessment & Plan Note (Addendum)
Check lipid panel  Wants to avoid medication Regular exercise and healthy diet encouraged  The 10-year ASCVD risk score Mikey Bussing DC Brooke Bonito., et al., 2013) is: 7.5%   Values used to calculate the score:     Age: 61 years     Sex: Female     Is Non-Hispanic African American: No     Diabetic: No     Tobacco smoker: No     Systolic Blood Pressure: 867 mmHg     Is BP treated: Yes     HDL Cholesterol: 50.3 mg/dL     Total Cholesterol: 232 mg/dL

## 2017-06-20 ENCOUNTER — Telehealth: Payer: Self-pay | Admitting: Internal Medicine

## 2017-06-20 NOTE — Telephone Encounter (Signed)
Patient has set up an appointment for 9/14 @ 945am

## 2017-06-20 NOTE — Telephone Encounter (Signed)
Patient was here on Tuesday 9/11. She now is having a sore throat. I informed she may need an appointment. But she would like to know if she can have something without coming in for an appointment. Please advise.

## 2017-06-20 NOTE — Telephone Encounter (Signed)
Unless it is strep we do not prescribe antibiotics - she should be seen because it may be viral.

## 2017-06-21 ENCOUNTER — Ambulatory Visit (INDEPENDENT_AMBULATORY_CARE_PROVIDER_SITE_OTHER): Payer: BC Managed Care – PPO | Admitting: Internal Medicine

## 2017-06-21 ENCOUNTER — Encounter: Payer: Self-pay | Admitting: Internal Medicine

## 2017-06-21 VITALS — BP 130/82 | HR 90 | Temp 98.5°F | Resp 16 | Wt 143.0 lb

## 2017-06-21 DIAGNOSIS — J029 Acute pharyngitis, unspecified: Secondary | ICD-10-CM | POA: Insufficient documentation

## 2017-06-21 MED ORDER — PREDNISONE 20 MG PO TABS: 20.0000 mg | ORAL_TABLET | Freq: Every day | ORAL | 0 refills | Status: DC

## 2017-06-21 NOTE — Assessment & Plan Note (Signed)
Likely viral, no need for an antibiotic - she has never had strep Deferred strep test There is some mild erythema and swelling Has done well with steroids in the past Prednisone 20 mg daily x few days Hold advil, ok to continue other otc cold meds  Call if no improvement

## 2017-06-21 NOTE — Progress Notes (Signed)
Subjective:    Patient ID: Samantha Shepard, female    DOB: Apr 03, 1956, 61 y.o.   MRN: [id number]  HPI She is here for an acute visit for cold symptoms.   Her symptoms started a few days ago.  She is experiencing sore throat and bilatearal ear pain. Her teeth hurt at times.  The throat pain is significant and it is hard to eat.  She denies fever, chills, sinus pain, cough, wheeze, sob and headaches.  She does not feel bad overall.   She has tried taking dymista, mucinex and tylenol.  They have not helped with her symptoms.    Medications and allergies reviewed with patient and updated if appropriate.  Patient Active Problem List   Diagnosis Date Noted  . Hyperglycemia 04/18/2016  . Numbness and tingling of leg 04/18/2016  . Osteopenia 01/15/2016  . Mild persistent asthma 01/10/2016  . Other allergic rhinitis 01/10/2016  . Hyperlipidemia 08/13/2008  . Migraine headache 09/22/2007  . Essential hypertension 09/22/2007  . Allergic rhinitis 05/26/2007    Current Outpatient Prescriptions on File Prior to Visit  Medication Sig Dispense Refill  . albuterol (PROVENTIL HFA) 108 (90 Base) MCG/ACT inhaler Inhale 2 puffs into the lungs every 4 (four) hours as needed for wheezing or shortness of breath. 1 Inhaler 0  . amLODipine (NORVASC) 5 MG tablet Take 0.5 tablets (2.5 mg total) by mouth daily. 90 tablet 3  . Azelastine-Fluticasone (DYMISTA) 137-50 MCG/ACT SUSP Place 2 sprays into the nose daily. 1 Bottle 5  . cetirizine (ZYRTEC) 10 MG tablet Take 10 mg by mouth daily. Reported on 01/10/2016    . losartan (COZAAR) 100 MG tablet Take 1 tablet (100 mg total) by mouth daily. 90 tablet 3  . mometasone (ASMANEX 60 METERED DOSES) 220 MCG/INH inhaler Inhale 2 puffs into the lungs daily. 1 Inhaler 3  . SUMAtriptan (IMITREX) 100 MG tablet Take 100 mg by mouth every 2 (two) hours as needed for migraine. May repeat in 2 hours if headache persists or recurs.     No current facility-administered  medications on file prior to visit.     Past Medical History:  Diagnosis Date  . Allergy    dust, cats  . Asthma   . Hyperlipidemia   . Hypertension   . Migraines     Past Surgical History:  Procedure Laterality Date  . COLONOSCOPY  2009   neg; [facility]  . thyroid cyst aspirated    . TONSILLECTOMY      Social History   Social History  . Marital status: Single    Spouse name: N/A  . Number of children: N/A  . Years of education: N/A   Social History Main Topics  . Smoking status: Never Smoker  . Smokeless tobacco: Never Used  . Alcohol use 4.2 oz/week    7 Glasses of wine per week     Comment: wine   . Drug use: No  . Sexual activity: Not on file   Other Topics Concern  . Not on file   Social History Narrative   Exercise: regular    Family History  Problem Relation Age of Onset  . Heart disease Mother        CABG 3 vessel @ 26  . Hypertension Mother   . Hyperlipidemia Mother   . Diabetes Mother   . Stroke Mother 4  . Prostate cancer Father   . Hypertension Father   . Breast cancer Sister   . Hyperlipidemia Sister  Review of Systems  Constitutional: Negative for chills and fever.  HENT: Positive for ear pain and sore throat. Negative for sinus pain.   Respiratory: Negative for cough, shortness of breath and wheezing.   Neurological: Negative for light-headedness and headaches.       Objective:   Vitals:   06/21/17 0947  BP: 130/82  Pulse: 90  Resp: 16  Temp: 98.5 F (36.9 C)  SpO2: 98%   Filed Weights   06/21/17 0947  Weight: 143 lb (64.9 kg)   Body mass index is 26.16 kg/m.  Wt Readings from Last 3 Encounters:  06/21/17 143 lb (64.9 kg)  06/18/17 143 lb (64.9 kg)  09/07/16 136 lb (61.7 kg)     Physical Exam GENERAL APPEARANCE: Appears stated age, well appearing, NAD EYES: conjunctiva clear, no icterus HEENT: bilateral tympanic membranes and ear canals normal, oropharynx with mild erythema and swelling, no thyromegaly,  trachea midline, no cervical or supraclavicular lymphadenopathy LUNGS: Clear to auscultation without wheeze or crackles, unlabored breathing, good air entry bilaterally HEART: Normal S1,S2 without murmurs EXTREMITIES: Without clubbing, cyanosis, or edema        Assessment & Plan:   See Problem List for Assessment and Plan of chronic medical problems.

## 2017-06-21 NOTE — Patient Instructions (Signed)
Take the prednisone as prescribed - daily with food.  If you are not feeling better please let me know.    Continue over the counter cold medicines as needed.

## 2017-07-11 ENCOUNTER — Telehealth: Payer: Self-pay | Admitting: Emergency Medicine

## 2017-07-11 MED ORDER — CEFDINIR 300 MG PO CAPS: 300.0000 mg | ORAL_CAPSULE | Freq: Two times a day (BID) | ORAL | 0 refills | Status: DC

## 2017-07-11 NOTE — Telephone Encounter (Signed)
Pt called and stated she was seen a few weeks ago for congestion. She is asking that you give her a call about this. Please advise thanks.

## 2017-07-11 NOTE — Telephone Encounter (Signed)
Ok - will prescribe an antibiotic - sent to [facility]

## 2017-07-11 NOTE — Telephone Encounter (Signed)
Spoke with pt states she has been taking Mucinex, nasal spray, completed steroid that was given at appt. States she still is not feeling better. Please advise.

## 2017-07-12 NOTE — Telephone Encounter (Signed)
Spoke with pt, she is aware medication has been sent to pharmacy.

## 2017-07-30 ENCOUNTER — Other Ambulatory Visit (INDEPENDENT_AMBULATORY_CARE_PROVIDER_SITE_OTHER): Payer: BC Managed Care – PPO

## 2017-07-30 DIAGNOSIS — J4530 Mild persistent asthma, uncomplicated: Secondary | ICD-10-CM | POA: Diagnosis not present

## 2017-07-30 DIAGNOSIS — I10 Essential (primary) hypertension: Secondary | ICD-10-CM

## 2017-07-30 DIAGNOSIS — R739 Hyperglycemia, unspecified: Secondary | ICD-10-CM | POA: Diagnosis not present

## 2017-07-30 DIAGNOSIS — E785 Hyperlipidemia, unspecified: Secondary | ICD-10-CM | POA: Diagnosis not present

## 2017-07-30 LAB — COMPREHENSIVE METABOLIC PANEL
ALT: 16 U/L (ref 0–35)
AST: 15 U/L (ref 0–37)
Albumin: 4 g/dL (ref 3.5–5.2)
Alkaline Phosphatase: 42 U/L (ref 39–117)
BILIRUBIN TOTAL: 0.6 mg/dL (ref 0.2–1.2)
BUN: 12 mg/dL (ref 6–23)
CALCIUM: 9.3 mg/dL (ref 8.4–10.5)
CO2: 24 meq/L (ref 19–32)
CREATININE: 0.66 mg/dL (ref 0.40–1.20)
Chloride: 106 mEq/L (ref 96–112)
GFR: 96.11 mL/min (ref >60.00)
Glucose, Bld: 96 mg/dL (ref 70–99)
Potassium: 4.1 mEq/L (ref 3.5–5.1)
SODIUM: 138 meq/L (ref 135–145)
Total Protein: 6.8 g/dL (ref 6.0–8.3)

## 2017-07-30 LAB — LIPID PANEL
CHOLESTEROL: 180 mg/dL (ref 0–200)
HDL: 41.7 mg/dL (ref >39.00)
NonHDL: 137.81
TRIGLYCERIDES: 229 mg/dL — AB (ref 0.0–149.0)
Total CHOL/HDL Ratio: 4
VLDL: 45.8 mg/dL — ABNORMAL HIGH (ref 0.0–40.0)

## 2017-07-30 LAB — CBC WITH DIFFERENTIAL/PLATELET
BASOS ABS: 0 10*3/uL (ref 0.0–0.1)
BASOS PCT: 0.6 % (ref 0.0–3.0)
Eosinophils Absolute: 0.3 10*3/uL (ref 0.0–0.7)
Eosinophils Relative: 4.3 % (ref 0.0–5.0)
HEMATOCRIT: 39.2 % (ref 36.0–46.0)
Hemoglobin: 13.2 g/dL (ref 12.0–15.0)
LYMPHS ABS: 2 10*3/uL (ref 0.7–4.0)
LYMPHS PCT: 27.1 % (ref 12.0–46.0)
MCHC: 33.7 g/dL (ref 30.0–36.0)
MCV: 102.4 fl — ABNORMAL HIGH (ref 78.0–100.0)
Monocytes Absolute: 0.6 10*3/uL (ref 0.1–1.0)
Monocytes Relative: 7.6 % (ref 3.0–12.0)
NEUTROS ABS: 4.4 10*3/uL (ref 1.4–7.7)
NEUTROS PCT: 60.4 % (ref 43.0–77.0)
PLATELETS: 264 10*3/uL (ref 150.0–400.0)
RBC: 3.83 Mil/uL — ABNORMAL LOW (ref 3.87–5.11)
RDW: 12.9 % (ref 11.5–15.5)
WBC: 7.4 10*3/uL (ref 4.0–10.5)

## 2017-07-30 LAB — LDL CHOLESTEROL, DIRECT: Direct LDL: 112 mg/dL

## 2017-07-30 LAB — HEMOGLOBIN A1C: HEMOGLOBIN A1C: 5.7 % (ref 4.6–6.5)

## 2017-07-30 LAB — TSH: TSH: 1.13 u[IU]/mL (ref 0.35–4.50)

## 2017-08-01 ENCOUNTER — Encounter: Payer: Self-pay | Admitting: Internal Medicine

## 2017-08-01 DIAGNOSIS — R7303 Prediabetes: Secondary | ICD-10-CM | POA: Insufficient documentation

## 2017-09-04 ENCOUNTER — Other Ambulatory Visit: Payer: Self-pay | Admitting: Obstetrics & Gynecology

## 2017-09-04 DIAGNOSIS — Z1231 Encounter for screening mammogram for malignant neoplasm of breast: Secondary | ICD-10-CM

## 2017-09-20 ENCOUNTER — Telehealth: Payer: Self-pay | Admitting: Internal Medicine

## 2017-09-20 NOTE — Telephone Encounter (Signed)
LVM for patient to call back and schedule an appointment for the Saturday clinic Dr. Yong Channel, Dr. Hassell Done and Dr. Jenny Reichmann will be here

## 2017-09-20 NOTE — Telephone Encounter (Signed)
She really should be seen - can she be fit in tomorrow ?

## 2017-09-20 NOTE — Telephone Encounter (Signed)
Pt was given antibiotic in Oct, states she is having the same symptoms again and would like to know if you can send something else in for her. She has a productive cough that is colored and a lot of sinus pressure. She is currently using mucinex and nasal spray. Okay to send to [facility]

## 2017-09-20 NOTE — Telephone Encounter (Signed)
Pls see MD response below. Call pt and make appt for Sat clinic...Samantha Shepard

## 2017-09-20 NOTE — Telephone Encounter (Signed)
Copied from [place]. Topic: Quick Communication - See Telephone Encounter >> Sep 20, 2017  8:42 AM Aurelio Brash B wrote: CRM for notification. See Telephone encounter for: Pt requesting Dr Quay Burow nurse call her, she is stopped up and wants advice/or med She request to be called on her cell number [phone] 09/20/17.

## 2017-09-21 ENCOUNTER — Encounter: Payer: Self-pay | Admitting: Physician Assistant

## 2017-09-21 ENCOUNTER — Ambulatory Visit: Payer: BC Managed Care – PPO | Admitting: Family Medicine

## 2017-09-21 ENCOUNTER — Ambulatory Visit: Payer: BC Managed Care – PPO | Admitting: Physician Assistant

## 2017-09-21 VITALS — BP 138/80 | HR 82 | Temp 98.2°F | Resp 14 | Wt 144.0 lb

## 2017-09-21 DIAGNOSIS — B9789 Other viral agents as the cause of diseases classified elsewhere: Secondary | ICD-10-CM

## 2017-09-21 DIAGNOSIS — J019 Acute sinusitis, unspecified: Secondary | ICD-10-CM | POA: Diagnosis not present

## 2017-09-21 MED ORDER — BENZONATATE 100 MG PO CAPS: 100.0000 mg | ORAL_CAPSULE | Freq: Two times a day (BID) | ORAL | 0 refills | Status: DC | PRN

## 2017-09-21 NOTE — Patient Instructions (Addendum)
We are sorry that you are not feeling well.  Here is how we plan to help!  Based on what you have shared with me it looks like you have sinusitis.  Sinusitis is inflammation and infection in the sinus cavities of the head.  Based on your presentation I believe you most likely have Acute Viral Sinusitis.This is an infection most likely caused by a virus. There is not specific treatment for viral sinusitis other than to help you with the symptoms until the infection runs its course.  Start a daily Xyzal. Continue your Dymista. Saline nasal spray help and can safely be used as often as needed for congestion. Use Tessalon as directed for cough.  Some authorities believe that zinc sprays or the use of Echinacea may shorten the course of your symptoms.  Sinus infections are not as easily transmitted as other respiratory infection, however we still recommend that you avoid close contact with loved ones, especially the very young and elderly.  Remember to wash your hands thoroughly throughout the day as this is the number one way to prevent the spread of infection!  Home Care:  Only take medications as instructed by your medical team.  Complete the entire course of an antibiotic.  Do not take these medications with alcohol.  A steam or ultrasonic humidifier can help congestion.  You can place a towel over your head and breathe in the steam from hot water coming from a faucet.  Avoid close contacts especially the very young and the elderly.  Cover your mouth when you cough or sneeze.  Always remember to wash your hands.  Get Help Right Away If:  You develop worsening fever or sinus pain.  You develop a severe head ache or visual changes.  Your symptoms persist after you have completed your treatment plan.  Make sure you  Understand these instructions.  Will watch your condition.  Will get help right away if you are not doing well or get worse.

## 2017-09-21 NOTE — Progress Notes (Signed)
Patient presents to clinic today c/o 3 days of nasal congestion, R ear pain, with sinus headache. Endorses cough that is mildly productive. Denies fever, chills, aches. Denies recent travel or sick contact. Patient has been taking her dymista and Mucinex for symptom relief.    Past Medical History:  Diagnosis Date  . Allergy    dust, cats  . Asthma   . Hyperlipidemia   . Hypertension   . Migraines     Current Outpatient Medications on File Prior to Visit  Medication Sig Dispense Refill  . amLODipine (NORVASC) 5 MG tablet Take 0.5 tablets (2.5 mg total) by mouth daily. 90 tablet 3  . Azelastine-Fluticasone (DYMISTA) 137-50 MCG/ACT SUSP Place 2 sprays into the nose daily. 1 Bottle 5  . losartan (COZAAR) 100 MG tablet Take 1 tablet (100 mg total) by mouth daily. 90 tablet 3  . mometasone (ASMANEX 60 METERED DOSES) 220 MCG/INH inhaler Inhale 2 puffs into the lungs daily. 1 Inhaler 3  . SUMAtriptan (IMITREX) 100 MG tablet Take 100 mg by mouth every 2 (two) hours as needed for migraine. May repeat in 2 hours if headache persists or recurs.    Marland Kitchen albuterol (PROVENTIL HFA) 108 (90 Base) MCG/ACT inhaler Inhale 2 puffs into the lungs every 4 (four) hours as needed for wheezing or shortness of breath. (Patient not taking: Reported on 09/21/2017) 1 Inhaler 0  . cetirizine (ZYRTEC) 10 MG tablet Take 10 mg by mouth daily. Reported on 01/10/2016     No current facility-administered medications on file prior to visit.     Allergies  Allergen Reactions  . Sulfonamide Derivatives     REACTION: rash  . Codeine     REACTION: Excitability--can't sleep    Family History  Problem Relation Age of Onset  . Heart disease Mother        CABG 3 vessel @ 91  . Hypertension Mother   . Hyperlipidemia Mother   . Diabetes Mother   . Stroke Mother 24  . Prostate cancer Father   . Hypertension Father   . Breast cancer Sister   . Hyperlipidemia Sister     Social History   Socioeconomic History  .  Marital status: Single    Spouse name: None  . Number of children: None  . Years of education: None  . Highest education level: None  Social Needs  . Financial resource strain: None  . Food insecurity - worry: None  . Food insecurity - inability: None  . Transportation needs - medical: None  . Transportation needs - non-medical: None  Occupational History  . None  Tobacco Use  . Smoking status: Never Smoker  . Smokeless tobacco: Never Used  Substance and Sexual Activity  . Alcohol use: Yes    Alcohol/week: 4.2 oz    Types: 7 Glasses of wine per week    Comment: wine   . Drug use: No  . Sexual activity: None  Other Topics Concern  . None  Social History Narrative   Exercise: regular   Review of Systems - See HPI.  All other ROS are negative.  BP 138/80   Pulse 82   Temp 98.2 F (36.8 C) (Oral)   Resp 14   Wt 144 lb (65.3 kg)   SpO2 99%   BMI 26.34 kg/m   Physical Exam  Constitutional: She is oriented to person, place, and time and well-developed, well-nourished, and in no distress.  HENT:  Head: Normocephalic and atraumatic.  Right Ear: Tympanic  membrane normal.  Left Ear: Tympanic membrane normal.  Nose: Rhinorrhea present. No mucosal edema.  Mouth/Throat: Uvula is midline, oropharynx is clear and moist and mucous membranes are normal.  Eyes: Conjunctivae are normal.  Neck: Neck supple.  Cardiovascular: Normal rate, regular rhythm, normal heart sounds and intact distal pulses.  Pulmonary/Chest: Effort normal.  Lymphadenopathy:    She has no cervical adenopathy.  Neurological: She is alert and oriented to person, place, and time.  Skin: Skin is warm and dry. No rash noted.  Psychiatric: Affect normal.  Vitals reviewed.   Recent Results (from the past 2160 hour(s))  Comprehensive metabolic panel     Status: None   Collection Time: 07/30/17  8:54 AM  Result Value Ref Range   Sodium 138 135 - 145 mEq/L   Potassium 4.1 3.5 - 5.1 mEq/L   Chloride 106 96 -  112 mEq/L   CO2 24 19 - 32 mEq/L   Glucose, Bld 96 70 - 99 mg/dL   BUN 12 6 - 23 mg/dL   Creatinine, Ser 0.66 0.40 - 1.20 mg/dL   Total Bilirubin 0.6 0.2 - 1.2 mg/dL   Alkaline Phosphatase 42 39 - 117 U/L   AST 15 0 - 37 U/L   ALT 16 0 - 35 U/L   Total Protein 6.8 6.0 - 8.3 g/dL   Albumin 4.0 3.5 - 5.2 g/dL   Calcium 9.3 8.4 - 10.5 mg/dL   GFR 96.11 >60.00 mL/min  CBC with Differential/Platelet     Status: Abnormal   Collection Time: 07/30/17  8:54 AM  Result Value Ref Range   WBC 7.4 4.0 - 10.5 K/uL   RBC 3.83 (L) 3.87 - 5.11 Mil/uL   Hemoglobin 13.2 12.0 - 15.0 g/dL   HCT 39.2 36.0 - 46.0 %   MCV 102.4 (H) 78.0 - 100.0 fl   MCHC 33.7 30.0 - 36.0 g/dL   RDW 12.9 11.5 - 15.5 %   Platelets 264.0 150.0 - 400.0 K/uL   Neutrophils Relative % 60.4 43.0 - 77.0 %   Lymphocytes Relative 27.1 12.0 - 46.0 %   Monocytes Relative 7.6 3.0 - 12.0 %   Eosinophils Relative 4.3 0.0 - 5.0 %   Basophils Relative 0.6 0.0 - 3.0 %   Neutro Abs 4.4 1.4 - 7.7 K/uL   Lymphs Abs 2.0 0.7 - 4.0 K/uL   Monocytes Absolute 0.6 0.1 - 1.0 K/uL   Eosinophils Absolute 0.3 0.0 - 0.7 K/uL   Basophils Absolute 0.0 0.0 - 0.1 K/uL  Hemoglobin A1c     Status: None   Collection Time: 07/30/17  8:54 AM  Result Value Ref Range   Hgb A1c MFr Bld 5.7 4.6 - 6.5 %    Comment: Glycemic Control Guidelines for People with Diabetes:Non Diabetic:  <6%Goal of Therapy: <7%Additional Action Suggested:  >8%   Lipid panel     Status: Abnormal   Collection Time: 07/30/17  8:54 AM  Result Value Ref Range   Cholesterol 180 0 - 200 mg/dL    Comment: ATP III Classification       Desirable:  < 200 mg/dL               Borderline High:  200 - 239 mg/dL          High:  > = 240 mg/dL   Triglycerides 229.0 (H) 0.0 - 149.0 mg/dL    Comment: Normal:  <150 mg/dLBorderline High:  150 - 199 mg/dL   HDL 41.70 >39.00 mg/dL  VLDL 45.8 (H) 0.0 - 40.0 mg/dL   Total CHOL/HDL Ratio 4     Comment:                Men          Women1/2 Average Risk      3.4          3.3Average Risk          5.0          4.42X Average Risk          9.6          7.13X Average Risk          15.0          11.0                       NonHDL 137.81     Comment: NOTE:  Non-HDL goal should be 30 mg/dL higher than patient's LDL goal (i.e. LDL goal of < 70 mg/dL, would have non-HDL goal of < 100 mg/dL)  TSH     Status: None   Collection Time: 07/30/17  8:54 AM  Result Value Ref Range   TSH 1.13 0.35 - 4.50 uIU/mL  LDL cholesterol, direct     Status: None   Collection Time: 07/30/17  8:54 AM  Result Value Ref Range   Direct LDL 112.0 mg/dL    Comment: Optimal:  <100 mg/dLNear or Above Optimal:  100-129 mg/dLBorderline High:  130-159 mg/dLHigh:  160-189 mg/dLVery High:  >190 mg/dL    Assessment/Plan: 1. Acute viral sinusitis Supportive measures and OTC medications reviewed. Echinacea supplement recommended. Tessalon per orders. Follow-up discussed.  - benzonatate (TESSALON) 100 MG capsule; Take 1 capsule (100 mg total) by mouth 2 (two) times daily as needed for cough.  Dispense: 20 capsule; Refill: 0   Leeanne Rio, PA-C

## 2017-10-03 ENCOUNTER — Telehealth: Payer: Self-pay | Admitting: Internal Medicine

## 2017-10-03 NOTE — Telephone Encounter (Signed)
Spoke with pt, scheduled appt tomorrow with Dr Quay Burow due to pts current symptoms.

## 2017-10-03 NOTE — Progress Notes (Deleted)
Subjective:    Patient ID: Samantha Shepard, female    DOB: [birth year], 61 y.o.   MRN: [id number]  HPI She is here for an acute visit for cold symptoms.  Her symptoms started   She is experiencing  She has taken  Medications and allergies reviewed with patient and updated if appropriate.  Patient Active Problem List   Diagnosis Date Noted  . Prediabetes 08/01/2017  . Sore throat 06/21/2017  . Numbness and tingling of leg 04/18/2016  . Osteopenia 01/15/2016  . Mild persistent asthma 01/10/2016  . Other allergic rhinitis 01/10/2016  . Hyperlipidemia 08/13/2008  . Migraine headache 09/22/2007  . Essential hypertension 09/22/2007  . Allergic rhinitis 05/26/2007    Current Outpatient Medications on File Prior to Visit  Medication Sig Dispense Refill  . albuterol (PROVENTIL HFA) 108 (90 Base) MCG/ACT inhaler Inhale 2 puffs into the lungs every 4 (four) hours as needed for wheezing or shortness of breath. (Patient not taking: Reported on 09/21/2017) 1 Inhaler 0  . amLODipine (NORVASC) 5 MG tablet Take 0.5 tablets (2.5 mg total) by mouth daily. 90 tablet 3  . Azelastine-Fluticasone (DYMISTA) 137-50 MCG/ACT SUSP Place 2 sprays into the nose daily. 1 Bottle 5  . benzonatate (TESSALON) 100 MG capsule Take 1 capsule (100 mg total) by mouth 2 (two) times daily as needed for cough. 20 capsule 0  . cetirizine (ZYRTEC) 10 MG tablet Take 10 mg by mouth daily. Reported on 01/10/2016    . losartan (COZAAR) 100 MG tablet Take 1 tablet (100 mg total) by mouth daily. 90 tablet 3  . mometasone (ASMANEX 60 METERED DOSES) 220 MCG/INH inhaler Inhale 2 puffs into the lungs daily. 1 Inhaler 3  . SUMAtriptan (IMITREX) 100 MG tablet Take 100 mg by mouth every 2 (two) hours as needed for migraine. May repeat in 2 hours if headache persists or recurs.     No current facility-administered medications on file prior to visit.     Past Medical History:  Diagnosis Date  . Allergy    dust, cats  . Asthma    . Hyperlipidemia   . Hypertension   . Migraines     Past Surgical History:  Procedure Laterality Date  . COLONOSCOPY  2009   neg; [facility]  . thyroid cyst aspirated    . TONSILLECTOMY      Social History   Socioeconomic History  . Marital status: Single    Spouse name: Not on file  . Number of children: Not on file  . Years of education: Not on file  . Highest education level: Not on file  Social Needs  . Financial resource strain: Not on file  . Food insecurity - worry: Not on file  . Food insecurity - inability: Not on file  . Transportation needs - medical: Not on file  . Transportation needs - non-medical: Not on file  Occupational History  . Not on file  Tobacco Use  . Smoking status: Never Smoker  . Smokeless tobacco: Never Used  Substance and Sexual Activity  . Alcohol use: Yes    Alcohol/week: 4.2 oz    Types: 7 Glasses of wine per week    Comment: wine   . Drug use: No  . Sexual activity: Not on file  Other Topics Concern  . Not on file  Social History Narrative   Exercise: regular    Family History  Problem Relation Age of Onset  . Heart disease Mother  CABG 3 vessel @ 70  . Hypertension Mother   . Hyperlipidemia Mother   . Diabetes Mother   . Stroke Mother 38  . Prostate cancer Father   . Hypertension Father   . Breast cancer Sister   . Hyperlipidemia Sister     Review of Systems     Objective:  There were no vitals filed for this visit. There were no vitals filed for this visit. There is no height or weight on file to calculate BMI.  Wt Readings from Last 3 Encounters:  09/21/17 144 lb (65.3 kg)  06/21/17 143 lb (64.9 kg)  06/18/17 143 lb (64.9 kg)     Physical Exam GENERAL APPEARANCE: Appears stated age, well appearing, NAD EYES: conjunctiva clear, no icterus HEENT: bilateral tympanic membranes and ear canals normal, oropharynx with mild erythema, no thyromegaly, trachea midline, no cervical or supraclavicular  lymphadenopathy LUNGS: Clear to auscultation without wheeze or crackles, unlabored breathing, good air entry bilaterally CARDIOVASCULAR: Normal S1,S2 without murmurs, no edema SKIN: warm, dry        Assessment & Plan:   See Problem List for Assessment and Plan of chronic medical problems.

## 2017-10-03 NOTE — Telephone Encounter (Signed)
Copied from [place] #[id number]. Topic: General - Other >> Oct 03, 2017  9:35 AM Darl Householder, RMA wrote: Reason for CRM: patient is requesting a call back from Dr. Quay Burow or CMA

## 2017-10-04 ENCOUNTER — Encounter: Payer: Self-pay | Admitting: Internal Medicine

## 2017-10-04 ENCOUNTER — Ambulatory Visit: Payer: BC Managed Care – PPO | Admitting: Internal Medicine

## 2017-10-04 ENCOUNTER — Ambulatory Visit
Admission: RE | Admit: 2017-10-04 | Discharge: 2017-10-04 | Disposition: A | Discharge status: Home / Self Care | Payer: BC Managed Care – PPO | Source: Ambulatory Visit | Attending: Obstetrics & Gynecology | Admitting: Obstetrics & Gynecology

## 2017-10-04 VITALS — BP 132/88 | HR 92 | Temp 98.2°F | Resp 16 | Wt 143.0 lb

## 2017-10-04 DIAGNOSIS — Z1231 Encounter for screening mammogram for malignant neoplasm of breast: Secondary | ICD-10-CM

## 2017-10-04 DIAGNOSIS — J019 Acute sinusitis, unspecified: Secondary | ICD-10-CM | POA: Insufficient documentation

## 2017-10-04 DIAGNOSIS — J0100 Acute maxillary sinusitis, unspecified: Secondary | ICD-10-CM | POA: Insufficient documentation

## 2017-10-04 MED ORDER — METHYLPREDNISOLONE 4 MG PO TBPK: ORAL_TABLET | ORAL | 0 refills | Status: DC

## 2017-10-04 NOTE — Patient Instructions (Signed)
Take the steroids as directed.  Continue the over- the -counter cold medications.   If no improvement  Call monday

## 2017-10-04 NOTE — Assessment & Plan Note (Signed)
Consistent with resistant sinus infection, which she often gets Will try adding medrol dose pak and holding off on antibiotics, but if no improvement by Monday she will call and I will send in an antibiotic continue otc cold meds

## 2017-10-04 NOTE — Progress Notes (Signed)
Subjective:    Patient ID: Samantha Shepard, female    DOB: 1955-12-17, 61 y.o.   MRN: [id number]  HPI She is here for an acute visit for cold symptoms.  Her symptoms started > 2 weeks ago  She is experiencing coughing with occasional productive discolored phlegm, nasal congestion, ear pressure, pnd, sinus pressure, mild sob and wheeze.  She has had lightheadedness.  She denies fever and sinus pain.   She has taken tessalon perles, mucinex, dymista and xyzal.   Medications and allergies reviewed with patient and updated if appropriate.  Patient Active Problem List   Diagnosis Date Noted  . Prediabetes 08/01/2017  . Sore throat 06/21/2017  . Numbness and tingling of leg 04/18/2016  . Osteopenia 01/15/2016  . Mild persistent asthma 01/10/2016  . Other allergic rhinitis 01/10/2016  . Hyperlipidemia 08/13/2008  . Migraine headache 09/22/2007  . Essential hypertension 09/22/2007  . Allergic rhinitis 05/26/2007    Current Outpatient Medications on File Prior to Visit  Medication Sig Dispense Refill  . albuterol (PROVENTIL HFA) 108 (90 Base) MCG/ACT inhaler Inhale 2 puffs into the lungs every 4 (four) hours as needed for wheezing or shortness of breath. 1 Inhaler 0  . amLODipine (NORVASC) 5 MG tablet Take 0.5 tablets (2.5 mg total) by mouth daily. 90 tablet 3  . Azelastine-Fluticasone (DYMISTA) 137-50 MCG/ACT SUSP Place 2 sprays into the nose daily. 1 Bottle 5  . cetirizine (ZYRTEC) 10 MG tablet Take 10 mg by mouth daily. Reported on 01/10/2016    . losartan (COZAAR) 100 MG tablet Take 1 tablet (100 mg total) by mouth daily. 90 tablet 3  . mometasone (ASMANEX 60 METERED DOSES) 220 MCG/INH inhaler Inhale 2 puffs into the lungs daily. 1 Inhaler 3  . SUMAtriptan (IMITREX) 100 MG tablet Take 100 mg by mouth every 2 (two) hours as needed for migraine. May repeat in 2 hours if headache persists or recurs.    . benzonatate (TESSALON) 100 MG capsule Take 1 capsule (100 mg total) by mouth 2  (two) times daily as needed for cough. (Patient not taking: Reported on 10/04/2017) 20 capsule 0   No current facility-administered medications on file prior to visit.     Past Medical History:  Diagnosis Date  . Allergy    dust, cats  . Asthma   . Hyperlipidemia   . Hypertension   . Migraines     Past Surgical History:  Procedure Laterality Date  . COLONOSCOPY  2009   neg; [facility]  . thyroid cyst aspirated    . TONSILLECTOMY      Social History   Socioeconomic History  . Marital status: Single    Spouse name: None  . Number of children: None  . Years of education: None  . Highest education level: None  Social Needs  . Financial resource strain: None  . Food insecurity - worry: None  . Food insecurity - inability: None  . Transportation needs - medical: None  . Transportation needs - non-medical: None  Occupational History  . None  Tobacco Use  . Smoking status: Never Smoker  . Smokeless tobacco: Never Used  Substance and Sexual Activity  . Alcohol use: Yes    Alcohol/week: 4.2 oz    Types: 7 Glasses of wine per week    Comment: wine   . Drug use: No  . Sexual activity: None  Other Topics Concern  . None  Social History Narrative   Exercise: regular    Family  History  Problem Relation Age of Onset  . Heart disease Mother        CABG 3 vessel @ 1  . Hypertension Mother   . Hyperlipidemia Mother   . Diabetes Mother   . Stroke Mother 84  . Prostate cancer Father   . Hypertension Father   . Breast cancer Sister   . Hyperlipidemia Sister     Review of Systems  Constitutional: Negative for chills and fever.  HENT: Positive for congestion, postnasal drip and sinus pressure. Negative for ear pain (pressure, no pain), sinus pain and sore throat.   Respiratory: Positive for cough (productive in AM), shortness of breath (mild) and wheezing (mild).   Neurological: Positive for light-headedness. Negative for headaches.       Objective:   Vitals:     10/04/17 1426  BP: 132/88  Pulse: 92  Resp: 16  Temp: 98.2 F (36.8 C)  SpO2: 98%   Filed Weights   10/04/17 1426  Weight: 143 lb (64.9 kg)   Body mass index is 26.16 kg/m.  Wt Readings from Last 3 Encounters:  10/04/17 143 lb (64.9 kg)  09/21/17 144 lb (65.3 kg)  06/21/17 143 lb (64.9 kg)     Physical Exam GENERAL APPEARANCE: Appears stated age, well appearing, NAD EYES: conjunctiva clear, no icterus HEENT: bilateral tympanic membranes and ear canals normal, oropharynx with no erythema, no thyromegaly, trachea midline, no cervical or supraclavicular lymphadenopathy LUNGS: Clear to auscultation without wheeze or crackles, unlabored breathing, good air entry bilaterally CARDIOVASCULAR: Normal S1,S2 without murmurs, no edema SKIN: warm, dry        Assessment & Plan:   See Problem List for Assessment and Plan of chronic medical problems.

## 2017-10-29 ENCOUNTER — Encounter: Payer: Self-pay | Admitting: Pediatrics

## 2017-10-29 ENCOUNTER — Ambulatory Visit: Payer: BC Managed Care – PPO | Admitting: Pediatrics

## 2017-10-29 VITALS — BP 114/68 | HR 79 | Temp 98.3°F | Resp 16 | Ht 63.0 in | Wt 144.0 lb

## 2017-10-29 DIAGNOSIS — I10 Essential (primary) hypertension: Secondary | ICD-10-CM

## 2017-10-29 DIAGNOSIS — J4531 Mild persistent asthma with (acute) exacerbation: Secondary | ICD-10-CM | POA: Diagnosis not present

## 2017-10-29 DIAGNOSIS — J0100 Acute maxillary sinusitis, unspecified: Secondary | ICD-10-CM

## 2017-10-29 DIAGNOSIS — J3089 Other allergic rhinitis: Secondary | ICD-10-CM | POA: Diagnosis not present

## 2017-10-29 MED ORDER — MOMETASONE FUROATE 220 MCG/INH IN AEPB: 2.0000 | INHALATION_SPRAY | Freq: Every day | RESPIRATORY_TRACT | 5 refills | Status: DC

## 2017-10-29 MED ORDER — AMOXICILLIN-POT CLAVULANATE 875-125 MG PO TABS: ORAL_TABLET | ORAL | 0 refills | Status: DC

## 2017-10-29 NOTE — Progress Notes (Signed)
  [place] [address] Dept: [phone]  FOLLOW UP NOTE  Patient ID: Samantha Shepard, female    DOB: 1956/07/01  Age: 62 y.o. MRN: [id number] Date of Office Visit: 10/29/2017  Assessment  Chief Complaint: Cough (x 1 month) and Nasal Congestion  HPI Samantha Shepard presents for recurrent bouts of sneezing, cough, postnasal drainage and fullness in the ears for one month. She is not using Asmanex. She is not using nasal sprays. Her other medications are outlined in the chart   Drug Allergies:  Allergies  Allergen Reactions  . Sulfonamide Derivatives     REACTION: rash  . Codeine     REACTION: Excitability--can't sleep    Physical Exam: BP 114/68   Pulse 79   Temp 98.3 F (36.8 C) (Oral)   Resp 16   Ht 5\' 3"  (1.6 m)   Wt 144 lb (65.3 kg)   SpO2 99%   BMI 25.51 kg/m    Physical Exam  Constitutional: She is oriented to person, place, and time. She appears well-developed and well-nourished.  HENT:  Eyes normal. Ears normal. Nose mild swelling of nasal  turbinates. Pharynx normal except for a yellow postnasal drainage  Neck: Neck supple.  Cardiovascular:  S1 and S2 normal no murmurs  Pulmonary/Chest:  Clear to percussion and auscultation  Lymphadenopathy:    She has no cervical adenopathy.  Neurological: She is alert and oriented to person, place, and time.  Psychiatric: She has a normal mood and affect. Her behavior is normal. Judgment and thought content normal.  Vitals reviewed.   Diagnostics:  FVC 2.29 L FEV1 1.78 L. Predicted FVC 3.10 L predicted FEV1 2.38 L-this shows a mild reduction in the forced vital capacity  Assessment and Plan: 1. Mild persistent asthma with acute exacerbation   2. Acute non-recurrent maxillary sinusitis   3. Other allergic rhinitis   4. Essential hypertension     Meds ordered this encounter  Medications  . amoxicillin-clavulanate (AUGMENTIN) 875-125 MG tablet    Sig: Take 1 tablet every 12 hours for 10  days for infection    Dispense:  20 tablet    Refill:  0  . mometasone (ASMANEX) 220 MCG/INH inhaler    Sig: Inhale 2 puffs into the lungs daily. To prevent coughing and wheezing.    Dispense:  1 Inhaler    Refill:  5    Patient Instructions  Zyrtec 10 mg once a day for runny nose Dymista 2 sprays per nostril once a day Asmanex 220-2 puffs once a day to prevent coughing and wheezing. If you're doing better in 2 weeks decreased Asmanex 220- to 1 puff once a day for 2 months Proventil 2 puffs every 4 hours if needed for wheezing or coughing spells Augmentin 875 mg-take 1 tablet every 12 hours for 10 days for infection Add  prednisone 10 mg twice a day for 4 days 10 mg in the fifth day to bring you're asthma under control Call me if you are not doing better on this treatment plan   Return in about 1 year (around 10/29/2018).    Thank you for the opportunity to care for this patient.  Please do not hesitate to contact me with questions.  Penne Lash, M.D.  [facility] [address] [place], [place] [address] [phone]

## 2017-10-29 NOTE — Patient Instructions (Addendum)
Zyrtec 10 mg once a day for runny nose Dymista 2 sprays per nostril once a day Asmanex 220-2 puffs once a day to prevent coughing and wheezing. If you're doing better in 2 weeks decreased Asmanex 220- to 1 puff once a day for 2 months Proventil 2 puffs every 4 hours if needed for wheezing or coughing spells Augmentin 875 mg-take 1 tablet every 12 hours for 10 days for infection Add  prednisone 10 mg twice a day for 4 days 10 mg in the fifth day to bring you're asthma under control Call me if you are not doing better on this treatment plan

## 2017-11-02 ENCOUNTER — Other Ambulatory Visit: Payer: Self-pay | Admitting: Physician Assistant

## 2017-11-02 DIAGNOSIS — J019 Acute sinusitis, unspecified: Principal | ICD-10-CM

## 2017-11-02 DIAGNOSIS — B9789 Other viral agents as the cause of diseases classified elsewhere: Secondary | ICD-10-CM

## 2017-11-05 ENCOUNTER — Other Ambulatory Visit: Payer: Self-pay | Admitting: Allergy

## 2017-11-05 ENCOUNTER — Telehealth: Payer: Self-pay | Admitting: Allergy

## 2017-11-05 MED ORDER — PREDNISONE 10 MG PO TABS: ORAL_TABLET | ORAL | 0 refills | Status: DC

## 2017-11-05 NOTE — Telephone Encounter (Signed)
Dr. Shaune Leeks, Patient call and said she saw you last week. Said she was little better but felt like she still needed a little more prednisone. Please advise Phone number is [phone]

## 2017-11-05 NOTE — Telephone Encounter (Signed)
Call in prednisone 10 mg-take 1 tablet twice a day for 4 days, one tablet on the fifth day

## 2017-11-05 NOTE — Telephone Encounter (Signed)
Called in prednisone and informed patient.

## 2017-11-06 ENCOUNTER — Telehealth: Payer: Self-pay | Admitting: Internal Medicine

## 2017-11-06 MED ORDER — BENZONATATE 100 MG PO CAPS: ORAL_CAPSULE | ORAL | 0 refills | Status: DC

## 2017-11-06 NOTE — Telephone Encounter (Signed)
Ok to refill 

## 2017-11-06 NOTE — Telephone Encounter (Signed)
Copied from [place] [phone]. Topic: Quick Communication - Rx Refill/Question >> Nov 06, 2017 10:33 AM Synthia Innocent wrote: Medication: benzonatate (TESSALON) 100 MG capsule   Has the patient contacted their pharmacy? yes was subscribed by another MD, Urgent Care   (Agent: If no, request that the patient contact the pharmacy for the refill.)   Preferred Pharmacy (with phone number or street name): [facility] on [address] [phone]   Agent: Please be advised that RX refills may take up to 3 business days. We ask that you follow-up with your pharmacy.

## 2017-11-06 NOTE — Telephone Encounter (Signed)
Patient called to discuss why need a refill of benzonatate, she said "I saw Dr. Quay Burow on last month for sinusitis. I saw my allergist last week for sinusitis, was started on an antibiotic and prednisone. Monday I started coughing and I need these pills to stop the coughing. They work really well for me." I advised I will send this request to Dr. Quay Burow. Medication was last filled in 09/21/17.

## 2017-11-06 NOTE — Telephone Encounter (Signed)
Okay to send refill. 

## 2017-11-06 NOTE — Telephone Encounter (Signed)
LVM informing pt RX was sent to pharmacy.

## 2017-11-12 ENCOUNTER — Telehealth: Payer: Self-pay | Admitting: *Deleted

## 2017-11-12 MED ORDER — AZITHROMYCIN 250 MG PO TABS: ORAL_TABLET | ORAL | 0 refills | Status: DC

## 2017-11-12 NOTE — Telephone Encounter (Signed)
Pt made aware

## 2017-11-12 NOTE — Telephone Encounter (Signed)
Pt called stating she has finished her prednisone and Augmentin and she is still not any better. She is having congestion, cough with thick yellowish mucus. Pt states she is using all medications prescribed by Dr. Shaune Leeks. Please advise.

## 2017-11-12 NOTE — Telephone Encounter (Signed)
If she can take Zithromax , call in  Zithromax 250 mg tablet-take 2 tablets tonight, then 1 tablet at night for the next 4 nights

## 2018-02-13 NOTE — Telephone Encounter (Signed)
Can you close this please, Thank you.  °

## 2018-02-24 ENCOUNTER — Ambulatory Visit: Payer: BC Managed Care – PPO | Admitting: Family Medicine

## 2018-02-24 ENCOUNTER — Encounter: Payer: Self-pay | Admitting: Family Medicine

## 2018-02-24 VITALS — BP 124/76 | HR 76 | Temp 98.2°F | Resp 16

## 2018-02-24 DIAGNOSIS — J4530 Mild persistent asthma, uncomplicated: Secondary | ICD-10-CM | POA: Diagnosis not present

## 2018-02-24 DIAGNOSIS — J0100 Acute maxillary sinusitis, unspecified: Secondary | ICD-10-CM

## 2018-02-24 DIAGNOSIS — J3089 Other allergic rhinitis: Secondary | ICD-10-CM

## 2018-02-24 MED ORDER — AMOXICILLIN-POT CLAVULANATE 875-125 MG PO TABS
1.0000 | ORAL_TABLET | Freq: Two times a day (BID) | ORAL | 0 refills | Status: DC
Start: 2018-02-24 — End: 2018-03-24

## 2018-02-24 NOTE — Progress Notes (Signed)
[place] [address] Dept: [phone]  FOLLOW UP NOTE  Patient ID: Samantha Shepard, female    DOB: 1955-11-15  Age: 62 y.o. MRN: [id number] Date of Office Visit: 02/24/2018  Assessment  Chief Complaint: Cough  HPI Samantha Shepard is a 62 year old female who presents to the clinic for a follow up visit. She was last seen in this clinic on 10/29/2017 by Dr. Shaune Leeks for evaluation of asthma, allergic rhinitis, and acute sinusitis. At that visit, she was continued on Asmanex 220 and required an antibiotic and prednisone for resolution of symptoms.   At today's visit, she reports that, for the last 8 days, she has experienced head and nasal congestion, thick post nasal drainage, cough with green sputum in the morning, facial pain under both eyes, headache, and voice hoarseness. She reports that, about 1 week ago, she began using her Asmanex 220 inhaler once a day, Dymista nasal spray, and an antihistamine with no relief of symptoms. She denies fever, sweats, and chills. She denies any sick contacts.   Asthma is reported as well controlled, with the exception of the last week. She is reporting intermittent wheezing and shortness of breath with activity that began last week. She is using Asmnex 220 as needed, with the exception of this last week, when she has been using Asmanex daily. She has not used her albuterol inhaler since her last visit to this office.   Her current medications are listed in the chart.    Drug Allergies:  Allergies  Allergen Reactions  . Sulfonamide Derivatives     REACTION: rash  . Codeine     REACTION: Excitability--can't sleep    Physical Exam: BP 124/76   Pulse 76   Temp 98.2 F (36.8 C) (Oral)   Resp 16   SpO2 98%    Physical Exam  Constitutional: She is oriented to person, place, and time. She appears well-developed and well-nourished.  HENT:  Head: Normocephalic.  Right Ear: External ear normal.  Left Ear: External ear  normal.  Bilateral nares slightly erythematous and edematous with clear nasal drainage noted. Pharynx erythematous with cobblestone appearance. No exudates noted. Bilateral eyes slightly reddened with no drainage noted. Ears normal.  Neck: Normal range of motion. Neck supple.  Cardiovascular: Normal rate, regular rhythm and normal heart sounds.  No murmur noted  Pulmonary/Chest: Effort normal and breath sounds normal.  Lungs clear to auscultation  Musculoskeletal: Normal range of motion.  Neurological: She is alert and oriented to person, place, and time.  Skin: Skin is warm and dry.  Psychiatric: She has a normal mood and affect. Her behavior is normal. Thought content normal.    Diagnostics: FVC 2.34, FEV1 1.66. Predicted FVC 3.10, predicted FEV1 2.38   Assessment and Plan: 1. Mild persistent asthma without complication   2. Other allergic rhinitis   3. Acute non-recurrent maxillary sinusitis     Meds ordered this encounter  Medications  . amoxicillin-clavulanate (AUGMENTIN) 875-125 MG tablet    Sig: Take 1 tablet by mouth 2 (two) times daily.    Dispense:  20 tablet    Refill:  0    Patient Instructions  Zyrtec 10 mg once a day as needed for runny nose Dymista 2 sprays per nostril once a day for a stuffy nose and sneezing Asmanex 220-2 puffs once a day to prevent coughing and wheezing. If you're doing better in 2 weeks decreased Asmanex 220- to 1 puff once a day for 2 months  Proventil 2 puffs every 4 hours if needed for wheezing or coughing spells Augmentin 875 mg-take 1 tablet every 12 hours for 10 days for infection Add  prednisone 10 mg twice a day for 4 days 10 mg in the fifth day to bring you're asthma under control Call me if you are not doing better on this treatment plan Follow up in 2 months or sooner if needed   Return in about 2 months (around 04/26/2018), or if symptoms worsen or fail to improve.   Thank you for the opportunity to care for this patient.   Please do not hesitate to contact me with questions.  Gareth Morgan, FNP [facility]  I have provided oversight concerning Gareth Morgan' evaluation and treatment of this patient's health issues addressed during today's encounter. I agree with the assessment and therapeutic plan as outlined in the note.   Thank you for the opportunity to care for this patient.  Please do not hesitate to contact me with questions.  Penne Lash, M.D.  [facility] [address] [place], [place] [address] [phone]

## 2018-02-24 NOTE — Patient Instructions (Signed)
Zyrtec 10 mg once a day as needed for runny nose Dymista 2 sprays per nostril once a day for a stuffy nose and sneezing Asmanex 220-2 puffs once a day to prevent coughing and wheezing. If you're doing better in 2 weeks decreased Asmanex 220- to 1 puff once a day for 2 months Proventil 2 puffs every 4 hours if needed for wheezing or coughing spells Augmentin 875 mg-take 1 tablet every 12 hours for 10 days for infection Add  prednisone 10 mg twice a day for 4 days 10 mg in the fifth day to bring you're asthma under control Call me if you are not doing better on this treatment plan Follow up in 2 months or sooner if needed

## 2018-03-07 ENCOUNTER — Telehealth: Payer: Self-pay

## 2018-03-07 MED ORDER — IPRATROPIUM BROMIDE 0.03 % NA SOLN: 2.0000 | Freq: Three times a day (TID) | NASAL | 12 refills | Status: DC

## 2018-03-07 MED ORDER — PREDNISONE 10 MG PO TABS: ORAL_TABLET | ORAL | 0 refills | Status: DC

## 2018-03-07 NOTE — Telephone Encounter (Signed)
Pt. Seen on May 20th 2019 pt. States that her cough has turned into a lot of throat burning that's been going on for the last few days. Please advise.

## 2018-03-07 NOTE — Telephone Encounter (Signed)
Latoiya reports bilateral ear pain and constant throat burning that started on 02/24/2018 and has gradually gotten worse. She denies cough and fever, sweats, or chills. She is using Dymista and her Proventil. I will have her use a short prednisone burst in addition to Atrovent nasal spray. She will utilize saline nasal rinse prior to nasal sprays. She verbalizes understanding to call if she develops a fever or if symptoms worsen. If she is not better by Monday, she will come in to be seen in the office.

## 2018-03-24 ENCOUNTER — Encounter: Payer: Self-pay | Admitting: Internal Medicine

## 2018-03-24 ENCOUNTER — Ambulatory Visit: Payer: BC Managed Care – PPO | Admitting: Internal Medicine

## 2018-03-24 VITALS — BP 130/86 | HR 78 | Temp 97.8°F | Resp 16 | Wt 139.0 lb

## 2018-03-24 DIAGNOSIS — H9202 Otalgia, left ear: Secondary | ICD-10-CM

## 2018-03-24 NOTE — Assessment & Plan Note (Signed)
Your exam is normal-no evidence of infection Possibly TMJ, tooth issue or nerve pain Advised her to take higher dose of ibuprofen 3 times a day for 2-3 days to see if that helps We will follow-up with her dentist if needed Continue allergy medication-she may try Zyrtec-D, which is really helped in the past-she will monitor her blood pressure closely

## 2018-03-24 NOTE — Progress Notes (Signed)
Subjective:    Patient ID: Samantha Shepard, female    DOB: Oct 05, 1956, 62 y.o.   MRN: [id number]  HPI She is here for an acute visit for ear symptoms.  Her symptoms started yesterday  She is experiencing teeth pain on the left side - mostly the lower but the upper teeth a little, left ear pain, her throat Lathan Gieselman on left side, which is chronic and neck pain on the left.  She can eat on the left side, so she did not think it was her teeth.  She is up-to-date with her dental visits and has very good dental hygiene.  She did see her allergist regarding the burning throat and some of her chronic allergy symptoms and she was prescribed prednisone and Augmentin.  She thinks this helped, but she continues to have the burning her throat.  She is chronic nasal congestion and postnasal drip which are unchanged from her chronic baseline.  She has has a chronic cough that is unchanged.  She has taken one advil and nasal spray and it helped a little.   Medications and allergies reviewed with patient and updated if appropriate.  Patient Active Problem List   Diagnosis Date Noted  . Acute non-recurrent maxillary sinusitis 10/04/2017  . Prediabetes 08/01/2017  . Sore throat 06/21/2017  . Numbness and tingling of leg 04/18/2016  . Osteopenia 01/15/2016  . Mild persistent asthma 01/10/2016  . Other allergic rhinitis 01/10/2016  . Hyperlipidemia 08/13/2008  . Migraine headache 09/22/2007  . Essential hypertension 09/22/2007  . Allergic rhinitis 05/26/2007    Current Outpatient Medications on File Prior to Visit  Medication Sig Dispense Refill  . albuterol (PROVENTIL HFA) 108 (90 Base) MCG/ACT inhaler Inhale 2 puffs into the lungs every 4 (four) hours as needed for wheezing or shortness of breath. 1 Inhaler 0  . amLODipine (NORVASC) 5 MG tablet Take 0.5 tablets (2.5 mg total) by mouth daily. 90 tablet 3  . Azelastine-Fluticasone (DYMISTA) 137-50 MCG/ACT SUSP Place 2 sprays into the nose daily. 1  Bottle 5  . cetirizine (ZYRTEC) 10 MG tablet Take 10 mg by mouth daily. Reported on 01/10/2016    . ipratropium (ATROVENT) 0.03 % nasal spray Place 2 sprays into both nostrils 3 (three) times daily. 30 mL 12  . losartan (COZAAR) 100 MG tablet Take 1 tablet (100 mg total) by mouth daily. 90 tablet 3  . mometasone (ASMANEX) 220 MCG/INH inhaler Inhale 2 puffs into the lungs daily. To prevent coughing and wheezing. 1 Inhaler 5   No current facility-administered medications on file prior to visit.     Past Medical History:  Diagnosis Date  . Allergy    dust, cats  . Asthma   . Hyperlipidemia   . Hypertension   . Migraines     Past Surgical History:  Procedure Laterality Date  . COLONOSCOPY  2009   neg; [facility]  . thyroid cyst aspirated    . TONSILLECTOMY      Social History   Socioeconomic History  . Marital status: Single    Spouse name: Not on file  . Number of children: Not on file  . Years of education: Not on file  . Highest education level: Not on file  Occupational History  . Not on file  Social Needs  . Financial resource strain: Not on file  . Food insecurity:    Worry: Not on file    Inability: Not on file  . Transportation needs:    Medical:  Not on file    Non-medical: Not on file  Tobacco Use  . Smoking status: Never Smoker  . Smokeless tobacco: Never Used  Substance and Sexual Activity  . Alcohol use: Yes    Alcohol/week: 4.2 oz    Types: 7 Glasses of wine per week    Comment: wine   . Drug use: No  . Sexual activity: Not on file  Lifestyle  . Physical activity:    Days per week: Not on file    Minutes per session: Not on file  . Stress: Not on file  Relationships  . Social connections:    Talks on phone: Not on file    Gets together: Not on file    Attends religious service: Not on file    Active member of club or organization: Not on file    Attends meetings of clubs or organizations: Not on file    Relationship status: Not on file  Other  Topics Concern  . Not on file  Social History Narrative   Exercise: regular    Family History  Problem Relation Age of Onset  . Heart disease Mother        CABG 3 vessel @ 55  . Hypertension Mother   . Hyperlipidemia Mother   . Diabetes Mother   . Stroke Mother 46  . Prostate cancer Father   . Hypertension Father   . Breast cancer Sister   . Hyperlipidemia Sister   . Allergic rhinitis Neg Hx   . Asthma Neg Hx   . Angioedema Neg Hx   . Eczema Neg Hx   . Immunodeficiency Neg Hx   . Urticaria Neg Hx     Review of Systems  Constitutional: Positive for chills (x 1). Negative for fever.  HENT: Positive for congestion (chronic), ear pain (left only), postnasal drip (chronic, no change) and sore throat (left side).   Respiratory: Positive for cough (chronic, from PND). Negative for shortness of breath and wheezing.   Neurological: Positive for headaches (top of head). Negative for dizziness and light-headedness.       Objective:   Vitals:   03/24/18 1000  BP: 130/86  Pulse: 78  Resp: 16  Temp: 97.8 F (36.6 C)  SpO2: 98%   Filed Weights   03/24/18 1000  Weight: 139 lb (63 kg)   Body mass index is 24.62 kg/m.  Wt Readings from Last 3 Encounters:  03/24/18 139 lb (63 kg)  10/29/17 144 lb (65.3 kg)  10/04/17 143 lb (64.9 kg)     Physical Exam GENERAL APPEARANCE: Appears stated age, well appearing, NAD EYES: conjunctiva clear, no icterus HEENT: bilateral tympanic membranes and ear canals normal, oropharynx with no erythema, good dental hygiene-no obvious dental infection, no thyromegaly, trachea midline, no cervical or supraclavicular lymphadenopathy, no TMJ tenderness with palpation or with opening/closing mouth LUNGS: Clear to auscultation without wheeze or crackles, unlabored breathing, good air entry bilaterally CARDIOVASCULAR: Normal S1,S2 without murmurs, no edema SKIN: warm, dry        Assessment & Plan:   See Problem List for Assessment and Plan of  chronic medical problems.

## 2018-03-24 NOTE — Patient Instructions (Signed)
There is no evidence of an infection.  Try taking Advil 600 mg three times a day with food.     See your periodontist.

## 2018-04-07 ENCOUNTER — Ambulatory Visit: Payer: Self-pay | Admitting: Internal Medicine

## 2018-04-07 ENCOUNTER — Encounter: Payer: Self-pay | Admitting: Internal Medicine

## 2018-04-07 ENCOUNTER — Ambulatory Visit: Payer: BC Managed Care – PPO | Admitting: Internal Medicine

## 2018-04-07 VITALS — BP 138/84 | HR 89 | Temp 98.0°F | Resp 16 | Wt 139.0 lb

## 2018-04-07 DIAGNOSIS — J0100 Acute maxillary sinusitis, unspecified: Secondary | ICD-10-CM

## 2018-04-07 MED ORDER — DOXYCYCLINE HYCLATE 100 MG PO TABS: 100.0000 mg | ORAL_TABLET | Freq: Two times a day (BID) | ORAL | 0 refills | Status: DC

## 2018-04-07 NOTE — Telephone Encounter (Signed)
Pt calling c/o left facial pain, left side of throat soreness, left earache x 1 week. Since Thursday when she blows her nose she has small amounts of blood noted on the tissue. Pt states she has post nasal drip. She states that she is having severe facial pain and jaw pain. Pt is taking Ibuprofen for pain. She has tried cool compresses and warm compresses and saline nasal washing. Denies fever or that her nose is blocked. Nasal drainage  Is clear with small amount of blood noted on tissue. Care advice given. Pt given appt for today at 3:00 with PCP.  Reason for Disposition . Earache  Answer Assessment - Initial Assessment Questions 1. LOCATION: "Where does it hurt?"      Left side of face hurts 2. ONSET: "When did the sinus pain start?"  (e.g., hours, days)      Last week (Thursday) 3. SEVERITY: "How bad is the pain?"   (Scale 1-10; mild, moderate or severe)   - MILD (1-3): doesn't interfere with normal activities    - MODERATE (4-7): interferes with normal activities (e.g., work or school) or awakens from sleep   - SEVERE (8-10): excruciating pain and patient unable to do any normal activities        severe 4. RECURRENT SYMPTOM: "Have you ever had sinus problems before?" If so, ask: "When was the last time?" and "What happened that time?"      no 5. NASAL CONGESTION: "Is the nose blocked?" If so, ask, "Can you open it or must you breathe through the mouth?"     no 6. NASAL DISCHARGE: "Do you have discharge from your nose?" If so ask, "What color?"     Clear with small amount of blood 7. FEVER: "Do you have a fever?" If so, ask: "What is it, how was it measured, and when did it start?"      no 8. OTHER SYMPTOMS: "Do you have any other symptoms?" (e.g., sore throat, cough, earache, difficulty breathing)     Sore throat on the left side of throat, left earache, teeth hurt, headache 9. PREGNANCY: "Is there any chance you are pregnant?" "When was your last menstrual period?"      n/a  Protocols used: SINUS PAIN OR CONGESTION-A-AH

## 2018-04-07 NOTE — Assessment & Plan Note (Signed)
Concern for bacterial sinus infection, given treatment for TMJ has not helped. Start doxycycline x 10 days otc cold medications/ advil as needed Call if no improvement

## 2018-04-07 NOTE — Progress Notes (Signed)
Subjective:    Patient ID: Samantha Shepard, female    DOB: 1956/02/03, 62 y.o.   MRN: [id number]  HPI She is here for an acute visit for cold symptoms.  Her symptoms started one month ago.  She was here for left ear pain 2 weeks ago.  Her exam was normal and I was concerned she may have TMJ.  She has seen the dentist and her teeth are fine.  He did refer her to an oral surgeon - she was prescribed a muscle relaxer and anti-inflammatory.  It helped a little.  She wanted her to be seen by me to consider an antibiotic to see if that helped.  She was not convinced that the pain was related to TMJ.  Since she was here 2 weeks ago she has had increased left-sided facial pain and right neck pain.  She states nasal congestion, left ear pain, left-sided sinus pain, sore throat, occasional trouble swallowing, productive cough with discolored sputum and headaches.  She has not had any fevers or chills, shortness of breath or wheezing.  She did take the anti-inflammatory-diclofenac prescribed any oral surgeon and since then has been taking Advil alternating with Tylenol and a muscle relaxer at night.    Medications and allergies reviewed with patient and updated if appropriate.  Patient Active Problem List   Diagnosis Date Noted  . Left ear pain 03/24/2018  . Acute non-recurrent maxillary sinusitis 10/04/2017  . Prediabetes 08/01/2017  . Sore throat 06/21/2017  . Numbness and tingling of leg 04/18/2016  . Osteopenia 01/15/2016  . Mild persistent asthma 01/10/2016  . Other allergic rhinitis 01/10/2016  . Hyperlipidemia 08/13/2008  . Migraine headache 09/22/2007  . Essential hypertension 09/22/2007  . Allergic rhinitis 05/26/2007    Current Outpatient Medications on File Prior to Visit  Medication Sig Dispense Refill  . albuterol (PROVENTIL HFA) 108 (90 Base) MCG/ACT inhaler Inhale 2 puffs into the lungs every 4 (four) hours as needed for wheezing or shortness of breath. 1 Inhaler 0  .  amLODipine (NORVASC) 5 MG tablet Take 0.5 tablets (2.5 mg total) by mouth daily. 90 tablet 3  . Azelastine-Fluticasone (DYMISTA) 137-50 MCG/ACT SUSP Place 2 sprays into the nose daily. 1 Bottle 5  . cetirizine (ZYRTEC) 10 MG tablet Take 10 mg by mouth daily. Reported on 01/10/2016    . ipratropium (ATROVENT) 0.03 % nasal spray Place 2 sprays into both nostrils 3 (three) times daily. 30 mL 12  . losartan (COZAAR) 100 MG tablet Take 1 tablet (100 mg total) by mouth daily. 90 tablet 3  . mometasone (ASMANEX) 220 MCG/INH inhaler Inhale 2 puffs into the lungs daily. To prevent coughing and wheezing. 1 Inhaler 5   No current facility-administered medications on file prior to visit.     Past Medical History:  Diagnosis Date  . Allergy    dust, cats  . Asthma   . Hyperlipidemia   . Hypertension   . Migraines     Past Surgical History:  Procedure Laterality Date  . COLONOSCOPY  2009   neg; [facility]  . thyroid cyst aspirated    . TONSILLECTOMY      Social History   Socioeconomic History  . Marital status: Single    Spouse name: Not on file  . Number of children: Not on file  . Years of education: Not on file  . Highest education level: Not on file  Occupational History  . Not on file  Social Needs  .  Financial resource strain: Not on file  . Food insecurity:    Worry: Not on file    Inability: Not on file  . Transportation needs:    Medical: Not on file    Non-medical: Not on file  Tobacco Use  . Smoking status: Never Smoker  . Smokeless tobacco: Never Used  Substance and Sexual Activity  . Alcohol use: Yes    Alcohol/week: 4.2 oz    Types: 7 Glasses of wine per week    Comment: wine   . Drug use: No  . Sexual activity: Not on file  Lifestyle  . Physical activity:    Days per week: Not on file    Minutes per session: Not on file  . Stress: Not on file  Relationships  . Social connections:    Talks on phone: Not on file    Gets together: Not on file    Attends  religious service: Not on file    Active member of club or organization: Not on file    Attends meetings of clubs or organizations: Not on file    Relationship status: Not on file  Other Topics Concern  . Not on file  Social History Narrative   Exercise: regular    Family History  Problem Relation Age of Onset  . Heart disease Mother        CABG 3 vessel @ 27  . Hypertension Mother   . Hyperlipidemia Mother   . Diabetes Mother   . Stroke Mother 19  . Prostate cancer Father   . Hypertension Father   . Breast cancer Sister   . Hyperlipidemia Sister   . Allergic rhinitis Neg Hx   . Asthma Neg Hx   . Angioedema Neg Hx   . Eczema Neg Hx   . Immunodeficiency Neg Hx   . Urticaria Neg Hx     Review of Systems  Constitutional: Negative for chills and fever.  HENT: Positive for congestion, ear pain (left ear), sinus pain (left side), sore throat and trouble swallowing (sometimes).   Respiratory: Positive for cough (discolored sputum). Negative for shortness of breath and wheezing.   Neurological: Positive for headaches. Negative for dizziness and light-headedness.       Objective:   Vitals:   04/07/18 1505  BP: 138/84  Pulse: 89  Resp: 16  Temp: 98 F (36.7 C)  SpO2: 98%   Filed Weights   04/07/18 1505  Weight: 139 lb (63 kg)   Body mass index is 24.62 kg/m.  Wt Readings from Last 3 Encounters:  04/07/18 139 lb (63 kg)  03/24/18 139 lb (63 kg)  10/29/17 144 lb (65.3 kg)     Physical Exam GENERAL APPEARANCE: Appears stated age, well appearing, NAD EYES: conjunctiva clear, no icterus HEENT: bilateral tympanic membranes and ear canals normal, left maxillary sinus tenderness,  oropharynx with no erythema, no thyromegaly, trachea midline, no cervical or supraclavicular lymphadenopathy LUNGS: Clear to auscultation without wheeze or crackles, unlabored breathing, good air entry bilaterally CARDIOVASCULAR: Normal S1,S2 without murmurs, no edema SKIN: warm,  dry        Assessment & Plan:   See Problem List for Assessment and Plan of chronic medical problems.

## 2018-04-07 NOTE — Patient Instructions (Addendum)
An antibiotic was sent to your pharmacy.     Sinusitis, Adult Sinusitis is soreness and inflammation of your sinuses. Sinuses are hollow spaces in the bones around your face. Your sinuses are located:  Around your eyes.  In the middle of your forehead.  Behind your nose.  In your cheekbones.  Your sinuses and nasal passages are lined with a stringy fluid (mucus). Mucus normally drains out of your sinuses. When your nasal tissues become inflamed or swollen, the mucus can become trapped or blocked so air cannot flow through your sinuses. This allows bacteria, viruses, and funguses to grow, which leads to infection. Sinusitis can develop quickly and last for 7?10 days (acute) or for more than 12 weeks (chronic). Sinusitis often develops after a cold. What are the causes? This condition is caused by anything that creates swelling in the sinuses or stops mucus from draining, including:  Allergies.  Asthma.  Bacterial or viral infection.  Abnormally shaped bones between the nasal passages.  Nasal growths that contain mucus (nasal polyps).  Narrow sinus openings.  Pollutants, such as chemicals or irritants in the air.  A foreign object stuck in the nose.  A fungal infection. This is rare.  What increases the risk? The following factors may make you more likely to develop this condition:  Having allergies or asthma.  Having had a recent cold or respiratory tract infection.  Having structural deformities or blockages in your nose or sinuses.  Having a weak immune system.  Doing a lot of swimming or diving.  Overusing nasal sprays.  Smoking.  What are the signs or symptoms? The main symptoms of this condition are pain and a feeling of pressure around the affected sinuses. Other symptoms include:  Upper toothache.  Earache.  Headache.  Bad breath.  Decreased sense of smell and taste.  A cough that may get worse at night.  Fatigue.  Fever.  Thick drainage  from your nose. The drainage is often green and it may contain pus (purulent).  Stuffy nose or congestion.  Postnasal drip. This is when extra mucus collects in the throat or back of the nose.  Swelling and warmth over the affected sinuses.  Sore throat.  Sensitivity to light.  How is this diagnosed? This condition is diagnosed based on symptoms, a medical history, and a physical exam. To find out if your condition is acute or chronic, your health care provider may:  Look in your nose for signs of nasal polyps.  Tap over the affected sinus to check for signs of infection.  View the inside of your sinuses using an imaging device that has a light attached (endoscope).  If your health care provider suspects that you have chronic sinusitis, you may also:  Be tested for allergies.  Have a sample of mucus taken from your nose (nasal culture) and checked for bacteria.  Have a mucus sample examined to see if your sinusitis is related to an allergy.  If your sinusitis does not respond to treatment and it lasts longer than 8 weeks, you may have an MRI or CT scan to check your sinuses. These scans also help to determine how severe your infection is. In rare cases, a bone biopsy may be done to rule out more serious types of fungal sinus disease. How is this treated? Treatment for sinusitis depends on the cause and whether your condition is chronic or acute. If a virus is causing your sinusitis, your symptoms will go away on their own within  10 days. You may be given medicines to relieve your symptoms, including:  Topical nasal decongestants. They shrink swollen nasal passages and let mucus drain from your sinuses.  Antihistamines. These drugs block inflammation that is triggered by allergies. This can help to ease swelling in your nose and sinuses.  Topical nasal corticosteroids. These are nasal sprays that ease inflammation and swelling in your nose and sinuses.  Nasal saline washes.  These rinses can help to get rid of thick mucus in your nose.  If your condition is caused by bacteria, you will be given an antibiotic medicine. If your condition is caused by a fungus, you will be given an antifungal medicine. Surgery may be needed to correct underlying conditions, such as narrow nasal passages. Surgery may also be needed to remove polyps. Follow these instructions at home: Medicines  Take, use, or apply over-the-counter and prescription medicines only as told by your health care provider. These may include nasal sprays.  If you were prescribed an antibiotic medicine, take it as told by your health care provider. Do not stop taking the antibiotic even if you start to feel better. Hydrate and Humidify  Drink enough water to keep your urine clear or pale yellow. Staying hydrated will help to thin your mucus.  Use a cool mist humidifier to keep the humidity level in your home above 50%.  Inhale steam for 10-15 minutes, 3-4 times a day or as told by your health care provider. You can do this in the bathroom while a hot shower is running.  Limit your exposure to cool or dry air. Rest  Rest as much as possible.  Sleep with your head raised (elevated).  Make sure to get enough sleep each night. General instructions  Apply a warm, moist washcloth to your face 3-4 times a day or as told by your health care provider. This will help with discomfort.  Wash your hands often with soap and water to reduce your exposure to viruses and other germs. If soap and water are not available, use hand sanitizer.  Do not smoke. Avoid being around people who are smoking (secondhand smoke).  Keep all follow-up visits as told by your health care provider. This is important. Contact a health care provider if:  You have a fever.  Your symptoms get worse.  Your symptoms do not improve within 10 days. Get help right away if:  You have a severe headache.  You have persistent  vomiting.  You have pain or swelling around your face or eyes.  You have vision problems.  You develop confusion.  Your neck is stiff.  You have trouble breathing. This information is not intended to replace advice given to you by your health care provider. Make sure you discuss any questions you have with your health care provider. Document Released: 09/24/2005 Document Revised: 05/20/2016 Document Reviewed: 07/20/2015 Elsevier Interactive Patient Education  [employer].

## 2018-06-16 ENCOUNTER — Ambulatory Visit (INDEPENDENT_AMBULATORY_CARE_PROVIDER_SITE_OTHER)
Admission: RE | Admit: 2018-06-16 | Discharge: 2018-06-16 | Disposition: A | Discharge status: Home / Self Care | Payer: BC Managed Care – PPO | Source: Ambulatory Visit | Attending: Internal Medicine | Admitting: Internal Medicine

## 2018-06-16 ENCOUNTER — Ambulatory Visit: Payer: BC Managed Care – PPO | Admitting: Internal Medicine

## 2018-06-16 ENCOUNTER — Encounter: Payer: Self-pay | Admitting: Internal Medicine

## 2018-06-16 ENCOUNTER — Other Ambulatory Visit: Payer: Self-pay | Admitting: Internal Medicine

## 2018-06-16 VITALS — BP 144/80 | HR 78 | Temp 98.8°F | Resp 16 | Ht 63.0 in | Wt 139.0 lb

## 2018-06-16 DIAGNOSIS — M26622 Arthralgia of left temporomandibular joint: Secondary | ICD-10-CM | POA: Diagnosis not present

## 2018-06-16 DIAGNOSIS — M269 Dentofacial anomaly, unspecified: Secondary | ICD-10-CM

## 2018-06-16 DIAGNOSIS — F419 Anxiety disorder, unspecified: Secondary | ICD-10-CM | POA: Diagnosis not present

## 2018-06-16 DIAGNOSIS — R590 Localized enlarged lymph nodes: Secondary | ICD-10-CM

## 2018-06-16 DIAGNOSIS — M26629 Arthralgia of temporomandibular joint, unspecified side: Secondary | ICD-10-CM | POA: Insufficient documentation

## 2018-06-16 MED ORDER — CLONAZEPAM 0.5 MG PO TABS: 0.5000 mg | ORAL_TABLET | Freq: Every evening | ORAL | 1 refills | Status: DC | PRN

## 2018-06-16 NOTE — Assessment & Plan Note (Signed)
Jaw deformity feels like a bone, not soft tissue We will get x-rays and will also be getting ultrasound of her neck to evaluate further

## 2018-06-16 NOTE — Patient Instructions (Addendum)
Have an x-ray done today.   An ultrasound was ordered.  Medications reviewed and updated.  Changes include trying clonazepam at night for sleep and TMJ.   Your prescription(s) have been submitted to your pharmacy. Please take as directed and contact our office if you believe you are having problem(s) with the medication(s).

## 2018-06-16 NOTE — Progress Notes (Signed)
Subjective:    Patient ID: Samantha Shepard, female    DOB: 01/04/1956, 62 y.o.   MRN: [id number]  HPI The patient is here for an acute visit.  For a while she has had TMJ pain on her left side and she has seen me multiple times for this, her dentist and ENT.  She is going to integrative therapy and getting massages.  It is for her TMJ pain.  While getting massages she is found to have a couple of knots under her jaw on the left side.  They are tender.  She was advised to come in to have it evaluated.  She did see her dentist this morning and there is no issues with her teeth.  Anxiety: She is experiencing increased anxiety.  She is getting married in 1 month and that is causing a lot of stress.  She is not sleeping and wonders if she can take something to help her get through this next month.  She has tried Advil PM, but does not like taking that on a regular basis.  She also tried melatonin, but that made her groggy the next morning.  She does not want anything long-term and may not take it on a nightly basis, but is hoping that she can get through these next few weeks.  She is also wondering if something would help her chart relax that may help her TMJ pain.  When she did take the antibiotic that I prescribed in the past did seem to help some of her pain.  She wonders if there was an infection.     Medications and allergies reviewed with patient and updated if appropriate.  Patient Active Problem List   Diagnosis Date Noted  . Left ear pain 03/24/2018  . Subacute maxillary sinusitis 10/04/2017  . Prediabetes 08/01/2017  . Sore throat 06/21/2017  . Numbness and tingling of leg 04/18/2016  . Osteopenia 01/15/2016  . Mild persistent asthma 01/10/2016  . Other allergic rhinitis 01/10/2016  . Hyperlipidemia 08/13/2008  . Migraine headache 09/22/2007  . Essential hypertension 09/22/2007  . Allergic rhinitis 05/26/2007    Current Outpatient Medications on File Prior to Visit    Medication Sig Dispense Refill  . albuterol (PROVENTIL HFA) 108 (90 Base) MCG/ACT inhaler Inhale 2 puffs into the lungs every 4 (four) hours as needed for wheezing or shortness of breath. 1 Inhaler 0  . amLODipine (NORVASC) 5 MG tablet Take 0.5 tablets (2.5 mg total) by mouth daily. 90 tablet 3  . Azelastine-Fluticasone (DYMISTA) 137-50 MCG/ACT SUSP Place 2 sprays into the nose daily. 1 Bottle 5  . cetirizine (ZYRTEC) 10 MG tablet Take 10 mg by mouth daily. Reported on 01/10/2016    . ipratropium (ATROVENT) 0.03 % nasal spray Place 2 sprays into both nostrils 3 (three) times daily. 30 mL 12  . losartan (COZAAR) 100 MG tablet Take 1 tablet (100 mg total) by mouth daily. 90 tablet 3  . mometasone (ASMANEX) 220 MCG/INH inhaler Inhale 2 puffs into the lungs daily. To prevent coughing and wheezing. 1 Inhaler 5   No current facility-administered medications on file prior to visit.     Past Medical History:  Diagnosis Date  . Allergy    dust, cats  . Asthma   . Hyperlipidemia   . Hypertension   . Migraines     Past Surgical History:  Procedure Laterality Date  . COLONOSCOPY  2009   neg; [facility]  . thyroid cyst aspirated    .  TONSILLECTOMY      Social History   Socioeconomic History  . Marital status: Single    Spouse name: Not on file  . Number of children: Not on file  . Years of education: Not on file  . Highest education level: Not on file  Occupational History  . Not on file  Social Needs  . Financial resource strain: Not on file  . Food insecurity:    Worry: Not on file    Inability: Not on file  . Transportation needs:    Medical: Not on file    Non-medical: Not on file  Tobacco Use  . Smoking status: Never Smoker  . Smokeless tobacco: Never Used  Substance and Sexual Activity  . Alcohol use: Yes    Alcohol/week: 7.0 standard drinks    Types: 7 Glasses of wine per week    Comment: wine   . Drug use: No  . Sexual activity: Not on file  Lifestyle  . Physical  activity:    Days per week: Not on file    Minutes per session: Not on file  . Stress: Not on file  Relationships  . Social connections:    Talks on phone: Not on file    Gets together: Not on file    Attends religious service: Not on file    Active member of club or organization: Not on file    Attends meetings of clubs or organizations: Not on file    Relationship status: Not on file  Other Topics Concern  . Not on file  Social History Narrative   Exercise: regular    Family History  Problem Relation Age of Onset  . Heart disease Mother        CABG 3 vessel @ 60  . Hypertension Mother   . Hyperlipidemia Mother   . Diabetes Mother   . Stroke Mother 42  . Prostate cancer Father   . Hypertension Father   . Breast cancer Sister   . Hyperlipidemia Sister   . Allergic rhinitis Neg Hx   . Asthma Neg Hx   . Angioedema Neg Hx   . Eczema Neg Hx   . Immunodeficiency Neg Hx   . Urticaria Neg Hx     Review of Systems  Constitutional: Negative for appetite change, chills and fever.  HENT: Positive for ear pain (left, intermittent). Negative for sinus pressure, sinus pain and sore throat.   Respiratory: Positive for cough (allergy related). Negative for shortness of breath and wheezing.   Cardiovascular: Negative for chest pain and palpitations.  Neurological: Positive for headaches (dull ache).  Psychiatric/Behavioral: Positive for sleep disturbance. The patient is nervous/anxious (getting married in one month).        Objective:   Vitals:   06/16/18 1546  BP: (!) 144/80  Pulse: 78  Resp: 16  Temp: 98.8 F (37.1 C)  SpO2: 98%   BP Readings from Last 3 Encounters:  06/16/18 (!) 144/80  04/07/18 138/84  03/24/18 130/86   Wt Readings from Last 3 Encounters:  06/16/18 139 lb (63 kg)  04/07/18 139 lb (63 kg)  03/24/18 139 lb (63 kg)   Body mass index is 24.62 kg/m.   Physical Exam  Constitutional: She appears well-developed and well-nourished. No distress.    HENT:  Head: Normocephalic and atraumatic.  Right Ear: External ear normal.  Left Ear: External ear normal.  Nose: Nose normal.  Mouth/Throat: Oropharynx is clear and moist.  Bilateral ear canals and tympanic membranes normal.  Bump on left lower jaw that is tender-feels like it bony prominence-firm and non-mobile  Eyes: Conjunctivae are normal.  Neck: No thyromegaly present.  Cardiovascular: Normal rate and regular rhythm.  Pulmonary/Chest: No respiratory distress. She has no wheezes. She has no rales.  Lymphadenopathy:    She has cervical adenopathy (Bilateral upper anterior cervical adenopathy-left side is tender, minimal left lower anterior cervical lymphadenopathy).  Skin: Skin is warm and dry. She is not diaphoretic. No erythema.           Assessment & Plan:    See Problem List for Assessment and Plan of chronic medical problems.

## 2018-06-16 NOTE — Assessment & Plan Note (Signed)
Likely reactive ?  Infection We will get ultrasound soft tissues of neck No need for antibiotics since there is no obvious infection

## 2018-06-16 NOTE — Assessment & Plan Note (Signed)
Increased anxiety causing difficulty with sleep Related to multiple changes in her life at this time We will do short-term sleep medication-discussed potential addiction and concerns about Clonazepam bedtime as needed-this will also serve as a muscle relaxant for her TMJ

## 2018-06-16 NOTE — Assessment & Plan Note (Signed)
Time with her dentist Using a mouthguard We will do a short trial of clonazepam at night to see if that helps-has tried muscle relaxers in the past and they have not been effective

## 2018-06-17 ENCOUNTER — Encounter: Payer: Self-pay | Admitting: Internal Medicine

## 2018-06-23 ENCOUNTER — Other Ambulatory Visit: Payer: BC Managed Care – PPO

## 2018-06-24 ENCOUNTER — Ambulatory Visit
Admission: RE | Admit: 2018-06-24 | Discharge: 2018-06-24 | Disposition: A | Discharge status: Home / Self Care | Payer: BC Managed Care – PPO | Source: Ambulatory Visit | Attending: Internal Medicine | Admitting: Internal Medicine

## 2018-06-24 DIAGNOSIS — R590 Localized enlarged lymph nodes: Secondary | ICD-10-CM

## 2018-06-25 ENCOUNTER — Encounter: Payer: Self-pay | Admitting: Internal Medicine

## 2018-06-30 ENCOUNTER — Other Ambulatory Visit: Payer: Self-pay | Admitting: Internal Medicine

## 2018-07-02 ENCOUNTER — Telehealth: Payer: Self-pay

## 2018-07-02 NOTE — Telephone Encounter (Signed)
Copied from [place] [id number]. Topic: General - Other >> Jul 01, 2018  9:39 AM Valla Leaver wrote: Reason for CRM: Patient would like her x-rays sent to Dr. Radene Journey [phone] and Dr. Bethena Midget, oral surgeon with [facility] [phone]. Please call for additional info or notify once completed. See's Dr. Bethena Midget tomorrow.

## 2018-07-02 NOTE — Telephone Encounter (Signed)
Xray has been sent to requested providers. LVM letting pt know.

## 2018-08-12 ENCOUNTER — Other Ambulatory Visit: Payer: Self-pay | Admitting: Internal Medicine

## 2018-08-12 NOTE — Telephone Encounter (Signed)
Last refill was 07/16/18 Last OV was 06/16/18 Next OV is not listed

## 2018-09-08 ENCOUNTER — Other Ambulatory Visit: Payer: Self-pay | Admitting: Obstetrics & Gynecology

## 2018-09-08 DIAGNOSIS — Z1231 Encounter for screening mammogram for malignant neoplasm of breast: Secondary | ICD-10-CM

## 2018-09-26 ENCOUNTER — Other Ambulatory Visit: Payer: Self-pay | Admitting: Internal Medicine

## 2018-10-16 ENCOUNTER — Ambulatory Visit
Admission: RE | Admit: 2018-10-16 | Discharge: 2018-10-16 | Disposition: A | Discharge status: Home / Self Care | Payer: BC Managed Care – PPO | Source: Ambulatory Visit | Attending: Obstetrics & Gynecology | Admitting: Obstetrics & Gynecology

## 2018-10-16 DIAGNOSIS — Z1231 Encounter for screening mammogram for malignant neoplasm of breast: Secondary | ICD-10-CM

## 2018-10-17 ENCOUNTER — Other Ambulatory Visit: Payer: Self-pay | Admitting: Obstetrics & Gynecology

## 2018-10-17 DIAGNOSIS — R928 Other abnormal and inconclusive findings on diagnostic imaging of breast: Secondary | ICD-10-CM

## 2018-10-21 ENCOUNTER — Other Ambulatory Visit: Payer: Self-pay | Admitting: Obstetrics & Gynecology

## 2018-10-21 ENCOUNTER — Ambulatory Visit
Admission: RE | Admit: 2018-10-21 | Discharge: 2018-10-21 | Disposition: A | Discharge status: Home / Self Care | Payer: BC Managed Care – PPO | Source: Ambulatory Visit | Attending: Obstetrics & Gynecology | Admitting: Obstetrics & Gynecology

## 2018-10-21 DIAGNOSIS — R928 Other abnormal and inconclusive findings on diagnostic imaging of breast: Secondary | ICD-10-CM

## 2018-10-29 ENCOUNTER — Other Ambulatory Visit: Payer: Self-pay | Admitting: Obstetrics and Gynecology

## 2018-10-29 DIAGNOSIS — N6320 Unspecified lump in the left breast, unspecified quadrant: Secondary | ICD-10-CM

## 2018-11-27 ENCOUNTER — Encounter: Payer: Self-pay | Admitting: Gastroenterology

## 2018-12-03 NOTE — Progress Notes (Signed)
Subjective:    Patient ID: Samantha Shepard, female    DOB: 06-May-1956, 63 y.o.   MRN: [id number]  HPI The patient is here for follow up.  Hypertension: She is taking her medication daily as needed only. She is fairly compliant with a low sodium diet.  She denies chest pain, palpitations, edema, shortness of breath and regular headaches. She is exercising regularly.  She does monitor her blood pressure at home - 112/77, 134/84, 118/80, 133/80, 112/80, 124/82, 131/77, 146/86 ( took medicine), 136/74, 108/81, 155/88 (took medicine), 133/84, 126/78.  Marland Kitchen    Anxiety, difficulty sleeping:  She takes 1/2 of a pill about once a week to help her sleep.  Medication works well and she denies any side effects.  Prediabetes:  She is fairly compliant with a low sugar/carbohydrate diet.  She is exercising regularly.  Hyperlipidemia: She is controlling her cholesterol with diet. She is compliant with a low fat/cholesterol diet. She is exercising regularly.    Medications and allergies reviewed with patient and updated if appropriate.  Patient Active Problem List   Diagnosis Date Noted  . Temporomandibular joint (TMJ) pain 06/16/2018  . Anxiety 06/16/2018  . Cervical adenopathy 06/16/2018  . 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 Subacute maxillary sinusitis 10/04/2017  . Prediabetes 08/01/2017  . Numbness and tingling of leg 04/18/2016  . Osteopenia 01/15/2016  . Mild persistent asthma 01/10/2016  . Other allergic rhinitis 01/10/2016  . Hyperlipidemia 08/13/2008  . Migraine headache 09/22/2007  . Essential hypertension 09/22/2007  . Allergic rhinitis 05/26/2007    Current Outpatient Medications on File Prior to Visit  Medication Sig Dispense Refill  . Azelastine-Fluticasone (DYMISTA) 137-50 MCG/ACT SUSP Place 2 sprays into the nose daily. 1 Bottle 5  . ipratropium (ATROVENT) 0.03 % nasal spray Place 2 sprays into both nostrils 3 (three) times daily. 30 mL 12   No current  facility-administered medications on file prior to visit.     Past Medical History:  Diagnosis Date  . Allergy    dust, cats  . Asthma   . Hyperlipidemia   . Hypertension   . Migraines     Past Surgical History:  Procedure Laterality Date  . COLONOSCOPY  2009   neg; [facility]  . thyroid cyst aspirated    . TONSILLECTOMY      Social History   Socioeconomic History  . Marital status: Single    Spouse name: Not on file  . Number of children: Not on file  . Years of education: Not on file  . Highest education level: Not on file  Occupational History  . Not on file  Social Needs  . Financial resource strain: Not on file  . Food insecurity:    Worry: Not on file    Inability: Not on file  . Transportation needs:    Medical: Not on file    Non-medical: Not on file  Tobacco Use  . Smoking status: Never Smoker  . Smokeless tobacco: Never Used  Substance and Sexual Activity  . Alcohol use: Yes    Alcohol/week: 7.0 standard drinks    Types: 7 Glasses of wine per week    Comment: wine   . Drug use: No  . Sexual activity: Not on file  Lifestyle  . Physical activity:    Days per week: Not on file    Minutes per session: Not on file  . Stress: Not on file  Relationships  . Social connections:    Talks on phone:  Not on file    Gets together: Not on file    Attends religious service: Not on file    Active member of club or organization: Not on file    Attends meetings of clubs or organizations: Not on file    Relationship status: Not on file  Other Topics Concern  . Not on file  Social History Narrative   Exercise: regular    Family History  Problem Relation Age of Onset  . Heart disease Mother        CABG 3 vessel @ 36  . Hypertension Mother   . Hyperlipidemia Mother   . Diabetes Mother   . Stroke Mother 43  . Prostate cancer Father   . Hypertension Father   . Breast cancer Sister   . Hyperlipidemia Sister   . Allergic rhinitis Neg Hx   . Asthma Neg  Hx   . Angioedema Neg Hx   . Eczema Neg Hx   . Immunodeficiency Neg Hx   . Urticaria Neg Hx     Review of Systems     Objective:   Vitals:   12/04/18 1104  BP: 132/76  Pulse: 77  Resp: 16  Temp: 98.1 F (36.7 C)  SpO2: 98%   BP Readings from Last 3 Encounters:  12/04/18 132/76  06/16/18 (!) 144/80  04/07/18 138/84   Wt Readings from Last 3 Encounters:  12/04/18 145 lb 1.9 oz (65.8 kg)  06/16/18 139 lb (63 kg)  04/07/18 139 lb (63 kg)   Body mass index is 25.71 kg/m.   Physical Exam    Constitutional: Appears well-developed and well-nourished. No distress.  HENT:  Head: Normocephalic and atraumatic.  Neck: Neck supple. No tracheal deviation present. No thyromegaly present.  No cervical lymphadenopathy Cardiovascular: Normal rate, regular rhythm and normal heart sounds.   No murmur heard. No carotid bruit .  No edema Pulmonary/Chest: Effort normal and breath sounds normal. No respiratory distress. No has no wheezes. No rales.  Skin: Skin is warm and dry. Not diaphoretic.  Psychiatric: Normal mood and affect. Behavior is normal.      Assessment & Plan:    See Problem List for Assessment and Plan of chronic medical problems.

## 2018-12-04 ENCOUNTER — Encounter: Payer: Self-pay | Admitting: Internal Medicine

## 2018-12-04 ENCOUNTER — Ambulatory Visit: Payer: BC Managed Care – PPO | Admitting: Internal Medicine

## 2018-12-04 VITALS — BP 132/76 | HR 77 | Temp 98.1°F | Resp 16 | Ht 63.0 in | Wt 145.1 lb

## 2018-12-04 DIAGNOSIS — E785 Hyperlipidemia, unspecified: Secondary | ICD-10-CM | POA: Diagnosis not present

## 2018-12-04 DIAGNOSIS — F419 Anxiety disorder, unspecified: Secondary | ICD-10-CM | POA: Diagnosis not present

## 2018-12-04 DIAGNOSIS — R7303 Prediabetes: Secondary | ICD-10-CM

## 2018-12-04 DIAGNOSIS — I10 Essential (primary) hypertension: Secondary | ICD-10-CM | POA: Diagnosis not present

## 2018-12-04 MED ORDER — LOSARTAN POTASSIUM 50 MG PO TABS: 50.0000 mg | ORAL_TABLET | Freq: Every day | ORAL | 3 refills | Status: DC

## 2018-12-04 MED ORDER — CLONAZEPAM 0.5 MG PO TABS: ORAL_TABLET | ORAL | 0 refills | Status: DC

## 2018-12-04 NOTE — Assessment & Plan Note (Signed)
BP well controlled most of the time w/o medication - has occasional taken medication - twice Continue medication as needed - will decrease losartan to 50 mg daily prn - decreased dose since most of the time she does not need it and does not feel well after taking it cmp

## 2018-12-04 NOTE — Assessment & Plan Note (Signed)
Difficulty sleeping related to anxiety Takes 1/2 clonazepam about once a week Will continue  Medication renewed

## 2018-12-04 NOTE — Assessment & Plan Note (Signed)
Check a1c Low sugar / carb diet Stressed regular exercise   

## 2018-12-04 NOTE — Assessment & Plan Note (Signed)
Check lipid panel  Not on medication Regular exercise and healthy diet encouraged

## 2018-12-04 NOTE — Patient Instructions (Addendum)
  Tests ordered today. Your results will be released to [facility] (or called to you) after review, usually within 72hours after test completion. If any changes need to be made, you will be notified at that same time.   Medications reviewed and updated.  Changes include :   Decrease losartan 50 mg as needed.    Your prescription(s) have been submitted to your pharmacy. Please take as directed and contact our office if you believe you are having problem(s) with the medication(s).   Please followup in one year

## 2018-12-09 ENCOUNTER — Other Ambulatory Visit (INDEPENDENT_AMBULATORY_CARE_PROVIDER_SITE_OTHER): Payer: BC Managed Care – PPO

## 2018-12-09 ENCOUNTER — Telehealth: Payer: Self-pay | Admitting: Internal Medicine

## 2018-12-09 DIAGNOSIS — R7303 Prediabetes: Secondary | ICD-10-CM | POA: Diagnosis not present

## 2018-12-09 DIAGNOSIS — E785 Hyperlipidemia, unspecified: Secondary | ICD-10-CM

## 2018-12-09 DIAGNOSIS — I10 Essential (primary) hypertension: Secondary | ICD-10-CM

## 2018-12-09 LAB — CBC WITH DIFFERENTIAL/PLATELET
Basophils Absolute: 0.1 10*3/uL (ref 0.0–0.1)
Basophils Relative: 0.8 % (ref 0.0–3.0)
Eosinophils Absolute: 0.3 10*3/uL (ref 0.0–0.7)
Eosinophils Relative: 3.9 % (ref 0.0–5.0)
HCT: 40.4 % (ref 36.0–46.0)
Hemoglobin: 13.8 g/dL (ref 12.0–15.0)
LYMPHS ABS: 1.8 10*3/uL (ref 0.7–4.0)
Lymphocytes Relative: 24.9 % (ref 12.0–46.0)
MCHC: 34.3 g/dL (ref 30.0–36.0)
MCV: 100.5 fl — ABNORMAL HIGH (ref 78.0–100.0)
Monocytes Absolute: 0.5 10*3/uL (ref 0.1–1.0)
Monocytes Relative: 7.4 % (ref 3.0–12.0)
Neutro Abs: 4.7 10*3/uL (ref 1.4–7.7)
Neutrophils Relative %: 63 % (ref 43.0–77.0)
Platelets: 249 10*3/uL (ref 150.0–400.0)
RBC: 4.01 Mil/uL (ref 3.87–5.11)
RDW: 12.3 % (ref 11.5–15.5)
WBC: 7.4 10*3/uL (ref 4.0–10.5)

## 2018-12-09 LAB — COMPREHENSIVE METABOLIC PANEL
ALT: 22 U/L (ref 0–35)
AST: 17 U/L (ref 0–37)
Albumin: 4.3 g/dL (ref 3.5–5.2)
Alkaline Phosphatase: 50 U/L (ref 39–117)
BUN: 20 mg/dL (ref 6–23)
CHLORIDE: 103 meq/L (ref 96–112)
CO2: 25 meq/L (ref 19–32)
Calcium: 9.9 mg/dL (ref 8.4–10.5)
Creatinine, Ser: 0.75 mg/dL (ref 0.40–1.20)
GFR: 77.69 mL/min (ref >60.00)
GLUCOSE: 91 mg/dL (ref 70–99)
Potassium: 4.4 mEq/L (ref 3.5–5.1)
Sodium: 137 mEq/L (ref 135–145)
Total Bilirubin: 0.5 mg/dL (ref 0.2–1.2)
Total Protein: 7 g/dL (ref 6.0–8.3)

## 2018-12-09 LAB — LIPID PANEL
Cholesterol: 234 mg/dL — ABNORMAL HIGH (ref 0–200)
HDL: 51.5 mg/dL (ref >39.00)
LDL Cholesterol: 142 mg/dL — ABNORMAL HIGH (ref 0–99)
NonHDL: 182.18
Total CHOL/HDL Ratio: 5
Triglycerides: 200 mg/dL — ABNORMAL HIGH (ref 0.0–149.0)
VLDL: 40 mg/dL (ref 0.0–40.0)

## 2018-12-09 LAB — HEMOGLOBIN A1C: Hgb A1c MFr Bld: 5.6 % (ref 4.6–6.5)

## 2018-12-09 LAB — TSH: TSH: 1.14 u[IU]/mL (ref 0.35–4.50)

## 2018-12-09 MED ORDER — LOSARTAN POTASSIUM 100 MG PO TABS: 100.0000 mg | ORAL_TABLET | Freq: Every day | ORAL | 1 refills | Status: DC

## 2018-12-09 NOTE — Telephone Encounter (Signed)
Sent in losartan 100 mg for pt to cut in half and take 50 mg daily. Pt is aware.

## 2018-12-09 NOTE — Telephone Encounter (Signed)
Copied from [place] [id number]. Topic: Quick Communication - See Telephone Encounter >> Dec 09, 2018 12:46 PM Bea Graff, NT wrote: CRM for notification. See Telephone encounter for: 12/09/18. Pt states that the pharmacy does not have the losartan (COZAAR) 50 MG tablet and she is checking to see what Dr. Quay Burow recommends. Please advise.

## 2018-12-13 ENCOUNTER — Encounter: Payer: Self-pay | Admitting: Internal Medicine

## 2019-02-16 ENCOUNTER — Other Ambulatory Visit: Payer: Self-pay | Admitting: Internal Medicine

## 2019-03-31 ENCOUNTER — Telehealth: Payer: Self-pay | Admitting: *Deleted

## 2019-03-31 NOTE — Telephone Encounter (Signed)
Spoke with pt and relayed message. Scheduled phone visit with anne on Thursday.

## 2019-03-31 NOTE — Telephone Encounter (Signed)
Pt called complaining of her face hurting and nose hurting that started over the weekend. Sunday morning she had her first nosebleed that she has had in awhile. She is using sinus gel in her sinuses every day and takes zyrtec, she is afraid to use dymista because she is afraid it will cause nosebleeds. She only uses Asmanex as needed because her insurance is not covering that anymore. Please advise.

## 2019-03-31 NOTE — Telephone Encounter (Signed)
She could try Polysporin ointment twice a day in each nostril.  Continue on Zyrtec.  We can  work her in tomorrow.  With Lelon Frohlich She may be getting a cold

## 2019-04-01 NOTE — Telephone Encounter (Signed)
Thank you :)

## 2019-04-02 ENCOUNTER — Other Ambulatory Visit: Payer: Self-pay

## 2019-04-02 ENCOUNTER — Encounter: Payer: Self-pay | Admitting: Family Medicine

## 2019-04-02 ENCOUNTER — Ambulatory Visit (INDEPENDENT_AMBULATORY_CARE_PROVIDER_SITE_OTHER): Payer: BC Managed Care – PPO | Admitting: Family Medicine

## 2019-04-02 DIAGNOSIS — J3089 Other allergic rhinitis: Secondary | ICD-10-CM | POA: Diagnosis not present

## 2019-04-02 DIAGNOSIS — R040 Epistaxis: Secondary | ICD-10-CM

## 2019-04-02 DIAGNOSIS — J4520 Mild intermittent asthma, uncomplicated: Secondary | ICD-10-CM | POA: Diagnosis not present

## 2019-04-02 MED ORDER — ALBUTEROL SULFATE HFA 108 (90 BASE) MCG/ACT IN AERS: 2.0000 | INHALATION_SPRAY | RESPIRATORY_TRACT | 2 refills | Status: DC | PRN

## 2019-04-02 NOTE — Progress Notes (Addendum)
RE: Samantha Shepard MRN: [id number] DOB: [birth year] Date of Telemedicine Visit: 04/02/2019  Referring provider: Binnie Rail, MD Primary care provider: Binnie Rail, MD  Chief Complaint: Nasal Congestion (sprayed lysol in her mask and wearing her mask ended up with nose bleeds and pain ) and Asthma (pt has had a harder time breathing because she has been outside alot lately.)   Telemedicine Follow Up Visit via Telephone: I connected with Tabia Landowski for a follow up on 04/02/19 by telephone and verified that I am speaking with the correct person using two identifiers.   I discussed the limitations, risks, security and privacy concerns of performing an evaluation and management service by telephone and the availability of in person appointments. I also discussed with the patient that there may be a patient responsible charge related to this service. The patient expressed understanding and agreed to proceed.  Patient is at home Provider is at the office.  Visit start time: 2:30 p.m. Visit end time: 3:25 p.m. Insurance consent/check in by: Jill Alexanders Medical consent and medical assistant/nurse: Katherina Right  History of Present Illness: She is a 63 y.o. female, who is being followed for asthma, allergic rhinitis, and acute sinusitis. Her previous allergy office visit was on 02/24/2018 with Gareth Morgan, [place].  At today's visit she reports that her asthma has been well controlled with occasional chest tightness that she thinks is related to stress and is relieved by movement around her house.  She reports no shortness of breath, cough, or wheeze with activity or at rest.  She reports she is using Asmanex for her rescue inhaler about 1 time every 2 or 3 weeks.  She is concerned as her insurance will no longer cover this medication.  She reports a dry, burning sensation in her sinuses which started about a month ago when she was using Lysol spray in her mask and then wearing  the mask.  On Saturday she sprayed Lysol in her house and on Sunday experienced a nosebleed from the right nare only lasting about 5 minutes.  She reports that his this has happened previously about a month ago when she was spraying Lysol in her mask.  She has been using Polysporin once a day in her nostrils as directed by Dr. Shaune Leeks.  She continues using saline nasal gel which is providing some relief of her dry nostrils. Her current medications are listed in the chart.  Assessment and Plan: Asthma Begin Ventolin (albuterol) 2 puffs every 4 hours as needed for cough or wheeze For asthma flares, begin Asmanex 100-2 puffs twice a day for 2 weeks or until cough and wheeze free (sample provided)  Epistaxis Avoid Lysol products for now Stop Dymista while you are having trouble with nosebleeds Pinch both nostrils while leaning forward for at least 5 minutes before checking to see if the bleeding has stopped. If bleeding is not controlled within 5-10 minutes apply a cotton ball soaked with oxymetazoline (Afrin) to the bleeding nostril for a few seconds.  If the problem persists or worsens a referral to ENT for further evaluation may be necessary.  Allergic rhinitis Continue saline nasal rinses as needed for nasal symptoms Continue saline nasal gel as needed for dry nostrils Continue Polysporin 1 application once a day for 1 week  Call the clinic if this treatment plan is not working well for you  Follow up in the office in 3 months or sooner if needed Return in about 3 months (around 07/03/2019), or  if symptoms worsen or fail to improve.  Meds ordered this encounter  Medications  . albuterol (VENTOLIN HFA) 108 (90 Base) MCG/ACT inhaler    Sig: Inhale 2 puffs into the lungs every 4 (four) hours as needed for wheezing or shortness of breath.    Dispense:  18 g    Refill:  2    Medication List:  Current Outpatient Medications  Medication Sig Dispense Refill  . Azelastine-Fluticasone 137-50  MCG/ACT SUSP SHAKE LIQUID AND USE 2 SPRAYS IN EACH NOSTRIL DAILY 23 g 1  . clonazePAM (KLONOPIN) 0.5 MG tablet TAKE 1 TABLET(0.5 MG) BY MOUTH AT BEDTIME AS NEEDED FOR ANXIETY OR TMJ 30 tablet 0  . estradiol-norethindrone (ACTIVELLA) 1-0.5 MG tablet Take 1 mg by mouth every other day.    . ipratropium (ATROVENT) 0.03 % nasal spray Place 2 sprays into both nostrils 3 (three) times daily. 30 mL 12  . losartan (COZAAR) 50 MG tablet Take 50 mg by mouth daily.    . mometasone (ASMANEX, 120 METERED DOSES,) 220 MCG/INH inhaler Inhale 2 puffs into the lungs daily.    Marland Kitchen albuterol (VENTOLIN HFA) 108 (90 Base) MCG/ACT inhaler Inhale 2 puffs into the lungs every 4 (four) hours as needed for wheezing or shortness of breath. 18 g 2   No current facility-administered medications for this visit.    Allergies: Allergies  Allergen Reactions  . Sulfonamide Derivatives     REACTION: rash  . Codeine     REACTION: Excitability--can't sleep   I reviewed her past medical history, social history, family history, and environmental history and no significant changes have been reported from previous visit on 02/24/2018.  Objective: Physical Exam Not obtained as encounter was done via telephone.   Previous notes and tests were reviewed.  I discussed the assessment and treatment plan with the patient. The patient was provided an opportunity to ask questions and all were answered. The patient agreed with the plan and demonstrated an understanding of the instructions.   The patient was advised to call back or seek an in-person evaluation if the symptoms worsen or if the condition fails to improve as anticipated.  I provided 55 minutes of non-face-to-face time during this encounter.  It was my pleasure to participate in [place] care today. Please feel free to contact me with any questions or concerns.   Sincerely,  Gareth Morgan, FNP   _________________________________________________  I have provided  oversight concerning [facility]'s evaluation and treatment of this patient's health issues addressed during today's encounter.  I agree with the assessment and therapeutic plan as outlined in the note.   Signed,   R Edgar Frisk, MD

## 2019-04-02 NOTE — Progress Notes (Deleted)
   [address] [place] [address] Dept: [phone]  FOLLOW UP NOTE  Patient ID: Samantha Shepard [facility], female    DOB: 1955-11-20  Age: 64 y.o. MRN: [id number] Date of Office Visit: 04/02/2019  Assessment  Chief Complaint: No chief complaint on file.  HPI Samantha Shepard is a 63 year old female who presents to the clinic for a follow up visit. She was last seen in the clinic on 02/24/2018 by Samantha Morgan, FNP for evaluation of asthma, allergic rhinitis, and acute sinusitis requiring prednisone and Augmentin for resolution.    Drug Allergies:  Allergies  Allergen Reactions  . Sulfonamide Derivatives     REACTION: rash  . Codeine     REACTION: Excitability--can't sleep    Physical Exam: There were no vitals taken for this visit.   Physical Exam  Diagnostics:    Assessment and Plan: No diagnosis found.  No orders of the defined types were placed in this encounter.   There are no Patient Instructions on file for this visit.  No follow-ups on file.    Thank you for the opportunity to care for this patient.  Please do not hesitate to contact me with questions.  Samantha Morgan, FNP [facility]

## 2019-04-02 NOTE — Patient Instructions (Addendum)
Asthma Begin Ventolin (albuterol) 2 puffs every 4 hours as needed for cough or wheeze For asthma flares, begin Asmanex 100-2 puffs twice a day for 2 weeks or until cough and wheeze free (sample provided)  Epistaxis Avoid Lysol products for now Stop Dymista while you are having trouble with nosebleeds Pinch both nostrils while leaning forward for at least 5 minutes before checking to see if the bleeding has stopped. If bleeding is not controlled within 5-10 minutes apply a cotton ball soaked with oxymetazoline (Afrin) to the bleeding nostril for a few seconds.  If the problem persists or worsens a referral to ENT for further evaluation may be necessary.  Allergic rhinitis Continue saline nasal rinses as needed for nasal symptoms Continue saline nasal gel as needed for dry nostrils Continue Polysporin 1 application once a day for 1 week  Call the clinic if this treatment plan is not working well for you  Follow up in the office in 3 months or sooner if needed

## 2019-04-22 ENCOUNTER — Other Ambulatory Visit: Payer: Self-pay | Admitting: Obstetrics and Gynecology

## 2019-04-22 ENCOUNTER — Ambulatory Visit
Admission: RE | Admit: 2019-04-22 | Discharge: 2019-04-22 | Disposition: A | Discharge status: Home / Self Care | Payer: BC Managed Care – PPO | Source: Ambulatory Visit | Attending: Obstetrics & Gynecology | Admitting: Obstetrics & Gynecology

## 2019-04-22 ENCOUNTER — Other Ambulatory Visit: Payer: Self-pay

## 2019-04-22 DIAGNOSIS — N6320 Unspecified lump in the left breast, unspecified quadrant: Secondary | ICD-10-CM

## 2019-04-22 DIAGNOSIS — R599 Enlarged lymph nodes, unspecified: Secondary | ICD-10-CM

## 2019-04-27 ENCOUNTER — Other Ambulatory Visit: Payer: Self-pay

## 2019-04-27 ENCOUNTER — Ambulatory Visit
Admission: RE | Admit: 2019-04-27 | Discharge: 2019-04-27 | Disposition: A | Discharge status: Home / Self Care | Payer: BC Managed Care – PPO | Source: Ambulatory Visit | Attending: Obstetrics and Gynecology | Admitting: Obstetrics and Gynecology

## 2019-04-27 ENCOUNTER — Other Ambulatory Visit: Payer: Self-pay | Admitting: Obstetrics and Gynecology

## 2019-04-27 DIAGNOSIS — N6320 Unspecified lump in the left breast, unspecified quadrant: Secondary | ICD-10-CM

## 2019-04-27 DIAGNOSIS — R599 Enlarged lymph nodes, unspecified: Secondary | ICD-10-CM

## 2019-05-05 ENCOUNTER — Ambulatory Visit: Payer: Self-pay | Admitting: General Surgery

## 2019-05-05 DIAGNOSIS — N6022 Fibroadenosis of left breast: Secondary | ICD-10-CM

## 2019-05-13 ENCOUNTER — Other Ambulatory Visit: Payer: Self-pay | Admitting: General Surgery

## 2019-05-13 DIAGNOSIS — N6022 Fibroadenosis of left breast: Secondary | ICD-10-CM

## 2019-05-27 ENCOUNTER — Other Ambulatory Visit: Payer: Self-pay | Admitting: Internal Medicine

## 2019-05-27 NOTE — Telephone Encounter (Signed)
Last OV 12/04/18 Next OV NA Last RF 12/04/18

## 2019-06-09 HISTORY — PX: BREAST EXCISIONAL BIOPSY: SUR124

## 2019-06-19 ENCOUNTER — Other Ambulatory Visit: Payer: Self-pay | Admitting: Family Medicine

## 2019-06-30 ENCOUNTER — Other Ambulatory Visit (HOSPITAL_COMMUNITY): Payer: BC Managed Care – PPO

## 2019-07-01 ENCOUNTER — Other Ambulatory Visit: Payer: Self-pay

## 2019-07-01 ENCOUNTER — Encounter (HOSPITAL_BASED_OUTPATIENT_CLINIC_OR_DEPARTMENT_OTHER): Payer: Self-pay | Admitting: *Deleted

## 2019-07-04 ENCOUNTER — Other Ambulatory Visit (HOSPITAL_COMMUNITY)
Admission: RE | Admit: 2019-07-04 | Discharge: 2019-07-04 | Disposition: A | Discharge status: Home / Self Care | Payer: Medicare Other | Source: Ambulatory Visit | Attending: General Surgery | Admitting: General Surgery

## 2019-07-04 DIAGNOSIS — N6022 Fibroadenosis of left breast: Secondary | ICD-10-CM | POA: Diagnosis not present

## 2019-07-04 DIAGNOSIS — Z01812 Encounter for preprocedural laboratory examination: Secondary | ICD-10-CM | POA: Diagnosis not present

## 2019-07-04 DIAGNOSIS — Z20828 Contact with and (suspected) exposure to other viral communicable diseases: Secondary | ICD-10-CM | POA: Diagnosis not present

## 2019-07-05 LAB — NOVEL CORONAVIRUS, NAA (HOSP ORDER, SEND-OUT TO REF LAB; TAT 18-24 HRS): SARS-CoV-2, NAA: NOT DETECTED

## 2019-07-06 ENCOUNTER — Ambulatory Visit: Payer: Self-pay | Admitting: Family Medicine

## 2019-07-07 ENCOUNTER — Encounter (HOSPITAL_BASED_OUTPATIENT_CLINIC_OR_DEPARTMENT_OTHER)
Admission: RE | Admit: 2019-07-07 | Discharge: 2019-07-07 | Disposition: A | Discharge status: Home / Self Care | Payer: Medicare Other | Source: Ambulatory Visit | Attending: General Surgery | Admitting: General Surgery

## 2019-07-07 ENCOUNTER — Other Ambulatory Visit: Payer: Self-pay

## 2019-07-07 ENCOUNTER — Ambulatory Visit
Admission: RE | Admit: 2019-07-07 | Discharge: 2019-07-07 | Disposition: A | Discharge status: Home / Self Care | Payer: BC Managed Care – PPO | Source: Ambulatory Visit | Attending: General Surgery | Admitting: General Surgery

## 2019-07-07 DIAGNOSIS — Z882 Allergy status to sulfonamides status: Secondary | ICD-10-CM | POA: Diagnosis not present

## 2019-07-07 DIAGNOSIS — N6022 Fibroadenosis of left breast: Secondary | ICD-10-CM

## 2019-07-07 DIAGNOSIS — N6489 Other specified disorders of breast: Secondary | ICD-10-CM | POA: Diagnosis present

## 2019-07-07 DIAGNOSIS — Z79899 Other long term (current) drug therapy: Secondary | ICD-10-CM | POA: Diagnosis not present

## 2019-07-07 DIAGNOSIS — Z803 Family history of malignant neoplasm of breast: Secondary | ICD-10-CM | POA: Diagnosis not present

## 2019-07-07 DIAGNOSIS — Z79890 Hormone replacement therapy (postmenopausal): Secondary | ICD-10-CM | POA: Diagnosis not present

## 2019-07-07 DIAGNOSIS — J45909 Unspecified asthma, uncomplicated: Secondary | ICD-10-CM | POA: Diagnosis not present

## 2019-07-07 DIAGNOSIS — I10 Essential (primary) hypertension: Secondary | ICD-10-CM | POA: Diagnosis not present

## 2019-07-07 DIAGNOSIS — Z885 Allergy status to narcotic agent status: Secondary | ICD-10-CM | POA: Diagnosis not present

## 2019-07-07 DIAGNOSIS — Z8249 Family history of ischemic heart disease and other diseases of the circulatory system: Secondary | ICD-10-CM | POA: Diagnosis not present

## 2019-07-07 NOTE — Progress Notes (Signed)
EKG reviewed by Dr. Fransisco Beau.  Will proceed with surgery as scheduled.

## 2019-07-07 NOTE — Progress Notes (Signed)

## 2019-07-08 ENCOUNTER — Other Ambulatory Visit: Payer: Self-pay

## 2019-07-08 ENCOUNTER — Ambulatory Visit (HOSPITAL_BASED_OUTPATIENT_CLINIC_OR_DEPARTMENT_OTHER): Payer: Medicare Other | Admitting: Anesthesiology

## 2019-07-08 ENCOUNTER — Encounter (HOSPITAL_BASED_OUTPATIENT_CLINIC_OR_DEPARTMENT_OTHER): Payer: Self-pay | Admitting: *Deleted

## 2019-07-08 ENCOUNTER — Ambulatory Visit (HOSPITAL_BASED_OUTPATIENT_CLINIC_OR_DEPARTMENT_OTHER)
Admission: RE | Admit: 2019-07-08 | Discharge: 2019-07-08 | Disposition: A | Discharge status: Home / Self Care | Payer: Medicare Other | Attending: General Surgery | Admitting: General Surgery

## 2019-07-08 ENCOUNTER — Encounter (HOSPITAL_BASED_OUTPATIENT_CLINIC_OR_DEPARTMENT_OTHER)
Admission: RE | Disposition: A | Discharge status: Home / Self Care | Payer: Self-pay | Source: Home / Self Care | Attending: General Surgery

## 2019-07-08 ENCOUNTER — Ambulatory Visit
Admission: RE | Admit: 2019-07-08 | Discharge: 2019-07-08 | Disposition: A | Discharge status: Home / Self Care | Payer: Medicare Other | Source: Ambulatory Visit | Attending: General Surgery | Admitting: General Surgery

## 2019-07-08 DIAGNOSIS — N6022 Fibroadenosis of left breast: Secondary | ICD-10-CM

## 2019-07-08 DIAGNOSIS — Z8249 Family history of ischemic heart disease and other diseases of the circulatory system: Secondary | ICD-10-CM | POA: Insufficient documentation

## 2019-07-08 DIAGNOSIS — N6489 Other specified disorders of breast: Secondary | ICD-10-CM | POA: Insufficient documentation

## 2019-07-08 DIAGNOSIS — J45909 Unspecified asthma, uncomplicated: Secondary | ICD-10-CM | POA: Insufficient documentation

## 2019-07-08 DIAGNOSIS — Z882 Allergy status to sulfonamides status: Secondary | ICD-10-CM | POA: Insufficient documentation

## 2019-07-08 DIAGNOSIS — Z79899 Other long term (current) drug therapy: Secondary | ICD-10-CM | POA: Insufficient documentation

## 2019-07-08 DIAGNOSIS — Z79890 Hormone replacement therapy (postmenopausal): Secondary | ICD-10-CM | POA: Insufficient documentation

## 2019-07-08 DIAGNOSIS — Z803 Family history of malignant neoplasm of breast: Secondary | ICD-10-CM | POA: Insufficient documentation

## 2019-07-08 DIAGNOSIS — I10 Essential (primary) hypertension: Secondary | ICD-10-CM | POA: Insufficient documentation

## 2019-07-08 DIAGNOSIS — Z885 Allergy status to narcotic agent status: Secondary | ICD-10-CM | POA: Insufficient documentation

## 2019-07-08 HISTORY — DX: Unspecified lump in the left breast, unspecified quadrant: N63.20

## 2019-07-08 HISTORY — DX: Anxiety disorder, unspecified: F41.9

## 2019-07-08 HISTORY — PX: BREAST LUMPECTOMY WITH RADIOACTIVE SEED LOCALIZATION: SHX6424

## 2019-07-08 SURGERY — BREAST LUMPECTOMY WITH RADIOACTIVE SEED LOCALIZATION
Anesthesia: General | Site: Breast | Laterality: Left

## 2019-07-08 MED ORDER — DEXAMETHASONE SODIUM PHOSPHATE 4 MG/ML IJ SOLN
INTRAMUSCULAR | Status: DC | PRN
  Administered 2019-07-08: 10 mg via INTRAVENOUS

## 2019-07-08 MED ORDER — EPHEDRINE SULFATE 50 MG/ML IJ SOLN
INTRAMUSCULAR | Status: DC | PRN
  Administered 2019-07-08: 10 mg via INTRAVENOUS

## 2019-07-08 MED ORDER — CEFAZOLIN SODIUM-DEXTROSE 2-4 GM/100ML-% IV SOLN
2.0000 g | INTRAVENOUS | Status: AC
  Administered 2019-07-08: 2 g via INTRAVENOUS

## 2019-07-08 MED ORDER — BUPIVACAINE-EPINEPHRINE 0.5% -1:200000 IJ SOLN
INTRAMUSCULAR | Status: DC | PRN
  Administered 2019-07-08: 20 mL

## 2019-07-08 MED ORDER — ONDANSETRON HCL 4 MG/2ML IJ SOLN: 4.0000 mg | Freq: Once | INTRAMUSCULAR | Status: DC | PRN

## 2019-07-08 MED ORDER — PROPOFOL 500 MG/50ML IV EMUL
INTRAVENOUS | Status: DC | PRN
  Administered 2019-07-08: 25 ug/kg/min via INTRAVENOUS

## 2019-07-08 MED ORDER — CHLORHEXIDINE GLUCONATE CLOTH 2 % EX PADS: 6.0000 | MEDICATED_PAD | Freq: Once | CUTANEOUS | Status: DC

## 2019-07-08 MED ORDER — MIDAZOLAM HCL 2 MG/2ML IJ SOLN: 1.0000 mg | INTRAMUSCULAR | Status: DC | PRN

## 2019-07-08 MED ORDER — FENTANYL CITRATE (PF) 100 MCG/2ML IJ SOLN
50.0000 ug | INTRAMUSCULAR | Status: DC | PRN
  Administered 2019-07-08: 09:00:00 50 ug via INTRAVENOUS

## 2019-07-08 MED ORDER — FENTANYL CITRATE (PF) 100 MCG/2ML IJ SOLN
INTRAMUSCULAR | Status: AC
  Filled 2019-07-08: qty 2

## 2019-07-08 MED ORDER — GABAPENTIN 300 MG PO CAPS
ORAL_CAPSULE | ORAL | Status: AC
  Filled 2019-07-08: qty 1

## 2019-07-08 MED ORDER — CEFAZOLIN SODIUM-DEXTROSE 2-4 GM/100ML-% IV SOLN
INTRAVENOUS | Status: AC
  Filled 2019-07-08: qty 100

## 2019-07-08 MED ORDER — TRAMADOL HCL 50 MG PO TABS: 50.0000 mg | ORAL_TABLET | Freq: Four times a day (QID) | ORAL | 0 refills | Status: DC | PRN

## 2019-07-08 MED ORDER — LIDOCAINE HCL (CARDIAC) PF 100 MG/5ML IV SOSY
PREFILLED_SYRINGE | INTRAVENOUS | Status: DC | PRN
  Administered 2019-07-08: 60 mg via INTRAVENOUS

## 2019-07-08 MED ORDER — ONDANSETRON HCL 4 MG/2ML IJ SOLN
INTRAMUSCULAR | Status: DC | PRN
  Administered 2019-07-08: 4 mg via INTRAVENOUS

## 2019-07-08 MED ORDER — KETOROLAC TROMETHAMINE 15 MG/ML IJ SOLN: 15.0000 mg | Freq: Once | INTRAMUSCULAR | Status: DC | PRN

## 2019-07-08 MED ORDER — ACETAMINOPHEN 500 MG PO TABS
1000.0000 mg | ORAL_TABLET | ORAL | Status: AC
  Administered 2019-07-08: 1000 mg via ORAL

## 2019-07-08 MED ORDER — PROPOFOL 10 MG/ML IV BOLUS
INTRAVENOUS | Status: DC | PRN
  Administered 2019-07-08: 200 mg via INTRAVENOUS

## 2019-07-08 MED ORDER — GABAPENTIN 300 MG PO CAPS
300.0000 mg | ORAL_CAPSULE | ORAL | Status: AC
  Administered 2019-07-08: 07:00:00 300 mg via ORAL

## 2019-07-08 MED ORDER — ACETAMINOPHEN 500 MG PO TABS
ORAL_TABLET | ORAL | Status: AC
  Filled 2019-07-08: qty 2

## 2019-07-08 MED ORDER — FENTANYL CITRATE (PF) 100 MCG/2ML IJ SOLN
25.0000 ug | INTRAMUSCULAR | Status: DC | PRN
  Administered 2019-07-08: 50 ug via INTRAVENOUS

## 2019-07-08 MED ORDER — LACTATED RINGERS IV SOLN
INTRAVENOUS | Status: DC
  Administered 2019-07-08: 07:00:00 via INTRAVENOUS

## 2019-07-08 MED ORDER — SCOPOLAMINE 1 MG/3DAYS TD PT72: 1.0000 | MEDICATED_PATCH | Freq: Once | TRANSDERMAL | Status: DC

## 2019-07-08 SURGICAL SUPPLY — 44 items
APPLIER CLIP 9.375 MED OPEN (MISCELLANEOUS) ×3
BLADE SURG 15 STRL LF DISP TIS (BLADE) ×1 IMPLANT
BLADE SURG 15 STRL SS (BLADE) ×2
CANISTER SUC SOCK COL 7IN (MISCELLANEOUS) ×3 IMPLANT
CANISTER SUCT 1200ML W/VALVE (MISCELLANEOUS) ×3 IMPLANT
CHLORAPREP W/TINT 26 (MISCELLANEOUS) ×3 IMPLANT
CLIP APPLIE 9.375 MED OPEN (MISCELLANEOUS) ×1 IMPLANT
COVER BACK TABLE REUSABLE LG (DRAPES) ×3 IMPLANT
COVER MAYO STAND REUSABLE (DRAPES) ×3 IMPLANT
COVER PROBE W GEL 5X96 (DRAPES) ×3 IMPLANT
COVER WAND RF STERILE (DRAPES) IMPLANT
DECANTER SPIKE VIAL GLASS SM (MISCELLANEOUS) IMPLANT
DERMABOND ADVANCED (GAUZE/BANDAGES/DRESSINGS) ×2
DERMABOND ADVANCED .7 DNX12 (GAUZE/BANDAGES/DRESSINGS) ×1 IMPLANT
DRAPE LAPAROSCOPIC ABDOMINAL (DRAPES) ×3 IMPLANT
DRAPE UTILITY XL STRL (DRAPES) ×3 IMPLANT
ELECT COATED BLADE 2.86 ST (ELECTRODE) ×3 IMPLANT
ELECT REM PT RETURN 9FT ADLT (ELECTROSURGICAL) ×3
ELECTRODE REM PT RTRN 9FT ADLT (ELECTROSURGICAL) ×1 IMPLANT
GLOVE BIO SURGEON STRL SZ 6.5 (GLOVE) ×2 IMPLANT
GLOVE BIO SURGEON STRL SZ7.5 (GLOVE) ×6 IMPLANT
GLOVE BIO SURGEONS STRL SZ 6.5 (GLOVE) ×1
GLOVE BIOGEL PI IND STRL 6.5 (GLOVE) ×1 IMPLANT
GLOVE BIOGEL PI INDICATOR 6.5 (GLOVE) ×2
GOWN STRL REUS W/ TWL LRG LVL3 (GOWN DISPOSABLE) ×2 IMPLANT
GOWN STRL REUS W/TWL LRG LVL3 (GOWN DISPOSABLE) ×4
ILLUMINATOR WAVEGUIDE N/F (MISCELLANEOUS) IMPLANT
KIT MARKER MARGIN INK (KITS) ×3 IMPLANT
LIGHT WAVEGUIDE WIDE FLAT (MISCELLANEOUS) IMPLANT
NEEDLE HYPO 25X1 1.5 SAFETY (NEEDLE) ×3 IMPLANT
NS IRRIG 1000ML POUR BTL (IV SOLUTION) IMPLANT
PACK BASIN DAY SURGERY FS (CUSTOM PROCEDURE TRAY) ×3 IMPLANT
PENCIL BUTTON HOLSTER BLD 10FT (ELECTRODE) ×3 IMPLANT
SLEEVE SCD COMPRESS KNEE MED (MISCELLANEOUS) ×3 IMPLANT
SPONGE LAP 18X18 RF (DISPOSABLE) ×3 IMPLANT
SUT MON AB 4-0 PC3 18 (SUTURE) ×3 IMPLANT
SUT SILK 2 0 SH (SUTURE) IMPLANT
SUT VICRYL 3-0 CR8 SH (SUTURE) ×3 IMPLANT
SYR CONTROL 10ML LL (SYRINGE) ×3 IMPLANT
TOWEL GREEN STERILE FF (TOWEL DISPOSABLE) ×3 IMPLANT
TRAY FAXITRON CT DISP (TRAY / TRAY PROCEDURE) ×3 IMPLANT
TUBE CONNECTING 20'X1/4 (TUBING) ×1
TUBE CONNECTING 20X1/4 (TUBING) ×2 IMPLANT
YANKAUER SUCT BULB TIP NO VENT (SUCTIONS) ×3 IMPLANT

## 2019-07-08 NOTE — Anesthesia Preprocedure Evaluation (Addendum)
Anesthesia Evaluation  Patient identified by MRN, date of birth, ID band Patient awake    Reviewed: Allergy & Precautions, NPO status , Patient's Chart, lab work & pertinent test results  Airway Mallampati: II  TM Distance: >3 FB Neck ROM: Full    Dental no notable dental hx.    Pulmonary asthma ,    Pulmonary exam normal breath sounds clear to auscultation       Cardiovascular hypertension, Pt. on medications Normal cardiovascular exam Rhythm:Regular Rate:Normal  ECG: NSR, rate 77   Neuro/Psych  Headaches, Anxiety    GI/Hepatic negative GI ROS, Neg liver ROS,   Endo/Other  negative endocrine ROS  Renal/GU negative Renal ROS     Musculoskeletal negative musculoskeletal ROS (+)   Abdominal   Peds  Hematology HLD   Anesthesia Other Findings LEFT BREAST COMPLEX SCLEROSING LESION  Reproductive/Obstetrics                            Anesthesia Physical Anesthesia Plan  ASA: II  Anesthesia Plan: General   Post-op Pain Management:    Induction: Intravenous  PONV Risk Score and Plan: 3 and Midazolam, Dexamethasone, Ondansetron and Treatment may vary due to age or medical condition  Airway Management Planned: LMA  Additional Equipment:   Intra-op Plan:   Post-operative Plan: Extubation in OR  Informed Consent: I have reviewed the patients History and Physical, chart, labs and discussed the procedure including the risks, benefits and alternatives for the proposed anesthesia with the patient or authorized representative who has indicated his/her understanding and acceptance.     Dental advisory given  Plan Discussed with: CRNA  Anesthesia Plan Comments:        Anesthesia Quick Evaluation

## 2019-07-08 NOTE — H&P (Signed)
Mortimer Fries  Location: [facility] Surgery Patient #: [id number] DOB: 1955-11-11 Married / Language: English / Race: White Female   History of Present Illness  The patient is a 63 year old female who presents with a breast mass. We are asked to see the patient in consultation by Dr. Vanessa Kick to evaluate her for a complex sclerosing lesion of the left breast. The patient is a 62 year old white female who recently went for a routine screening mammogram. At that time she was found to have a 1.3 cm area of distortion in the lower left breast as well as 1 abnormal appearing lymph node. Both of these areas were biopsied. The lymph node was benign appearing. The distortion in the breast and back as a complex sclerosing lesion. She denies any breast pain or discharge from the nipple. She does not smoke. She does have a family history of breast cancer in a sister and a paternal aunt.   Past Surgical History  Tonsillectomy   Diagnostic Studies History  Colonoscopy  5-10 years ago Mammogram  1-3 years ago  Allergies  Sulfa Antibiotics  Codeine and Related  Allergies Reconciled   Medication History  Losartan Potassium (50MG  Tablet, Oral) Active. Estradiol-Norethindrone Acet (1-0.5MG  Tablet, Oral) Active. Medications Reconciled  Social History  Alcohol use  Occasional alcohol use. Caffeine use  Carbonated beverages, Tea. No drug use  Tobacco use  Never smoker.  Family History  Arthritis  Father, Mother. Cerebrovascular Accident  Mother. Diabetes Mellitus  Mother. Heart Disease  Mother. Heart disease in female family member before age 51  Hypertension  Father, Mother. Melanoma  Father. Prostate Cancer  Father.  Pregnancy / Birth History  Age at menarche  54 years. Age of menopause  47-50 Contraceptive History  Oral contraceptives. Gravida  0 Para  0  Other Problems High blood pressure  Lump In Breast     Review of Systems   General Not Present- Appetite Loss, Chills, Fatigue, Fever, Night Sweats, Weight Gain and Weight Loss. Skin Not Present- Change in Wart/Mole, Dryness, Hives, Jaundice, New Lesions, Non-Healing Wounds, Rash and Ulcer. HEENT Present- Seasonal Allergies and Wears glasses/contact lenses. Not Present- Earache, Hearing Loss, Hoarseness, Nose Bleed, Oral Ulcers, Ringing in the Ears, Sinus Pain, Sore Throat, Visual Disturbances and Yellow Eyes. Respiratory Not Present- Bloody sputum, Chronic Cough, Difficulty Breathing, Snoring and Wheezing. Breast Present- Breast Pain. Not Present- Breast Mass, Nipple Discharge and Skin Changes. Cardiovascular Not Present- Chest Pain, Difficulty Breathing Lying Down, Leg Cramps, Palpitations, Rapid Heart Rate, Shortness of Breath and Swelling of Extremities. Gastrointestinal Not Present- Abdominal Pain, Bloating, Bloody Stool, Change in Bowel Habits, Chronic diarrhea, Constipation, Difficulty Swallowing, Excessive gas, Gets full quickly at meals, Hemorrhoids, Indigestion, Nausea, Rectal Pain and Vomiting. Female Genitourinary Not Present- Frequency, Nocturia, Painful Urination, Pelvic Pain and Urgency. Musculoskeletal Not Present- Back Pain, Joint Pain, Joint Stiffness, Muscle Pain, Muscle Weakness and Swelling of Extremities. Neurological Not Present- Decreased Memory, Fainting, Headaches, Numbness, Seizures, Tingling, Tremor, Trouble walking and Weakness. Psychiatric Not Present- Anxiety, Bipolar, Change in Sleep Pattern, Depression, Fearful and Frequent crying. Endocrine Not Present- Cold Intolerance, Excessive Hunger, Hair Changes, Heat Intolerance, Hot flashes and New Diabetes. Hematology Not Present- Blood Thinners, Easy Bruising, Excessive bleeding, Gland problems, HIV and Persistent Infections.  Vitals  Weight: 148 lb Height: 62in Body Surface Area: 1.68 m Body Mass Index: 27.07 kg/m  Temp.: 97.53F  Pulse: 99 (Regular)  BP: 128/84(Sitting, Left  Arm, Standard)       Physical Exam  General Mental Status-Alert. General Appearance-Consistent with stated age. Hydration-Well hydrated. Voice-Normal.  Head and Neck Head-normocephalic, atraumatic with no lesions or palpable masses. Trachea-midline. Thyroid Gland Characteristics - normal size and consistency.  Eye Eyeball - Bilateral-Extraocular movements intact. Sclera/Conjunctiva - Bilateral-No scleral icterus.  Chest and Lung Exam Chest and lung exam reveals -quiet, even and easy respiratory effort with no use of accessory muscles and on auscultation, normal breath sounds, no adventitious sounds and normal vocal resonance. Inspection Chest Wall - Normal. Back - normal.  Breast Note: There is no palpable mass in either breast. There is no palpable axillary, supraclavicular, or cervical lymphadenopathy.   Cardiovascular Cardiovascular examination reveals -normal heart sounds, regular rate and rhythm with no murmurs and normal pedal pulses bilaterally.  Abdomen Inspection Inspection of the abdomen reveals - No Hernias. Skin - Scar - no surgical scars. Palpation/Percussion Palpation and Percussion of the abdomen reveal - Soft, Non Tender, No Rebound tenderness, No Rigidity (guarding) and No hepatosplenomegaly. Auscultation Auscultation of the abdomen reveals - Bowel sounds normal.  Neurologic Neurologic evaluation reveals -alert and oriented x 3 with no impairment of recent or remote memory. Mental Status-Normal.  Musculoskeletal Normal Exam - Left-Upper Extremity Strength Normal and Lower Extremity Strength Normal. Normal Exam - Right-Upper Extremity Strength Normal and Lower Extremity Strength Normal.  Lymphatic Head & Neck  General Head & Neck Lymphatics: Bilateral - Description - Normal. Axillary  General Axillary Region: Bilateral - Description - Normal. Tenderness - Non Tender. Femoral & Inguinal  Generalized Femoral &  Inguinal Lymphatics: Bilateral - Description - Normal. Tenderness - Non Tender.    Assessment & Plan   SCLEROSING ADENOSIS OF BREAST, LEFT (N60.22) Impression: The patient appears to have a 1.3 cm area of complex sclerosing lesion in the lower portion of the left breast. Although this is not considered a high risk lesion there is about a 5-10% chance of missing something more significant. Because of this and my recommendation would be to have this area removed. She would also like to have this done. I have discussed with her in detail the risks and benefits of the operation as well as some of the technical aspects including the use of a radioactive seed for localization and she understands and wishes to proceed  Current Plans Pt Education - Breast Diseases: discussed with patient and provided information.

## 2019-07-08 NOTE — Transfer of Care (Signed)
Immediate Anesthesia Transfer of Care Note  Patient: Samantha Shepard  Procedure(s) Performed: LEFT BREAST LUMPECTOMY WITH RADIOACTIVE SEED LOCALIZATION (Left Breast)  Patient Location: PACU  Anesthesia Type:General  Level of Consciousness: drowsy and patient cooperative  Airway & Oxygen Therapy: Patient Spontanous Breathing and Patient connected to face mask oxygen  Post-op Assessment: Report given to RN and Post -op Vital signs reviewed and stable  Post vital signs: Reviewed and stable  Last Vitals:  Vitals Value Taken Time  BP 116/69 07/08/19 0935  Temp    Pulse 91 07/08/19 0936  Resp 13 07/08/19 0936  SpO2 96 % 07/08/19 0936  Vitals shown include unvalidated device data.  Last Pain:  Vitals:   07/08/19 0704  TempSrc: Oral  PainSc: 0-No pain         Complications: No apparent anesthesia complications

## 2019-07-08 NOTE — Anesthesia Postprocedure Evaluation (Signed)
Anesthesia Post Note  Patient: Samantha Shepard  Procedure(s) Performed: LEFT BREAST LUMPECTOMY WITH RADIOACTIVE SEED LOCALIZATION (Left Breast)     Patient location during evaluation: Phase II Anesthesia Type: General Level of consciousness: awake Pain management: pain level controlled Vital Signs Assessment: post-procedure vital signs reviewed and stable Respiratory status: spontaneous breathing Cardiovascular status: stable Postop Assessment: no apparent nausea or vomiting Anesthetic complications: no    Last Vitals:  Vitals:   07/08/19 1000 07/08/19 1015  BP: 138/85 135/68  Pulse: 96 88  Resp: 15 14  Temp:    SpO2: 99% 100%    Last Pain:  Vitals:   07/08/19 1015  TempSrc:   PainSc: 3    Pain Goal:                   Huston Foley

## 2019-07-08 NOTE — Discharge Instructions (Signed)
May take next dose of Tylenol at 1:15 PM on 07/08/2019 if needed.    Post Anesthesia Home Care Instructions  Activity: Get plenty of rest for the remainder of the day. A responsible individual must stay with you for 24 hours following the procedure.  For the next 24 hours, DO NOT: -Drive a car -Paediatric nurse -Drink alcoholic beverages -Take any medication unless instructed by your physician -Make any legal decisions or sign important papers.  Meals: Start with liquid foods such as gelatin or soup. Progress to regular foods as tolerated. Avoid greasy, spicy, heavy foods. If nausea and/or vomiting occur, drink only clear liquids until the nausea and/or vomiting subsides. Call your physician if vomiting continues.  Special Instructions/Symptoms: Your throat may feel dry or sore from the anesthesia or the breathing tube placed in your throat during surgery. If this causes discomfort, gargle with warm salt water. The discomfort should disappear within 24 hours.  If you had a scopolamine patch placed behind your ear for the management of post- operative nausea and/or vomiting:  1. The medication in the patch is effective for 72 hours, after which it should be removed.  Wrap patch in a tissue and discard in the trash. Wash hands thoroughly with soap and water. 2. You may remove the patch earlier than 72 hours if you experience unpleasant side effects which may include dry mouth, dizziness or visual disturbances. 3. Avoid touching the patch. Wash your hands with soap and water after contact with the patch.

## 2019-07-08 NOTE — Interval H&P Note (Signed)
History and Physical Interval Note:  07/08/2019 8:35 AM  Samantha Shepard  has presented today for surgery, with the diagnosis of LEFT BREAST COMPLEX SCLEROSING LESION.  The various methods of treatment have been discussed with the patient and family. After consideration of risks, benefits and other options for treatment, the patient has consented to  Procedure(s): LEFT BREAST LUMPECTOMY WITH RADIOACTIVE SEED LOCALIZATION (Left) as a surgical intervention.  The patient's history has been reviewed, patient examined, no change in status, stable for surgery.  I have reviewed the patient's chart and labs.  Questions were answered to the patient's satisfaction.     [name]

## 2019-07-08 NOTE — Anesthesia Procedure Notes (Signed)
Procedure Name: LMA Insertion Date/Time: 07/08/2019 8:48 AM Performed by: Signe Colt, CRNA Pre-anesthesia Checklist: Patient identified, Emergency Drugs available, Suction available and Patient being monitored Patient Re-evaluated:Patient Re-evaluated prior to induction Oxygen Delivery Method: Circle system utilized Preoxygenation: Pre-oxygenation with 100% oxygen Induction Type: IV induction Ventilation: Mask ventilation without difficulty LMA: LMA inserted LMA Size: 4.0 Number of attempts: 1 Airway Equipment and Method: Bite block Placement Confirmation: positive ETCO2 Tube secured with: Tape Dental Injury: Teeth and Oropharynx as per pre-operative assessment

## 2019-07-08 NOTE — Op Note (Signed)
07/08/2019  9:29 AM  PATIENT:  Samantha Shepard  63 y.o. female  PRE-OPERATIVE DIAGNOSIS:  LEFT BREAST COMPLEX SCLEROSING LESION  POST-OPERATIVE DIAGNOSIS:  LEFT BREAST COMPLEX SCLEROSING LESION  PROCEDURE:  Procedure(s): LEFT BREAST LUMPECTOMY WITH RADIOACTIVE SEED LOCALIZATION (Left)  SURGEON:  Surgeon(s) and Role:    Jovita Kussmaul, MD - Primary  PHYSICIAN ASSISTANT:   ASSISTANTS: none   ANESTHESIA:   local and general  EBL:  minimal   BLOOD ADMINISTERED:none  DRAINS: none   LOCAL MEDICATIONS USED:  MARCAINE     SPECIMEN:  Source of Specimen:  left breast tissue  DISPOSITION OF SPECIMEN:  PATHOLOGY  COUNTS:  YES  TOURNIQUET:  * No tourniquets in log *  DICTATION: .Dragon Dictation   After informed consent was obtained the patient was brought to the operating room and placed in the supine position on the operating table.  After adequate induction of general anesthesia the patient's left breast was prepped with ChloraPrep, allowed to dry, and draped in usual sterile manner.  An appropriate timeout was performed.  Previously an I-125 seed was placed in the lower outer quadrant of the left breast to mark an area of complex sclerosing lesion.  The neoprobe was set to I-125 in the area of radioactivity was readily identified.  The area around this was infiltrated with quarter percent Marcaine.  A curvilinear incision was then made with a 15 blade knife along the inframammary fold in the lower outer quadrant.  The incision was carried through the skin and subcutaneous tissue sharply with the electrocautery.  Dissection was then carried out between the breast tissue and the chest wall superiorly until we were beyond the area of the radioactive seed.  Dissection was then carried out anteriorly between the breast tissue and the subcutaneous fatty tissue until we were well beyond the area of the radioactive seed.  Next a circular portion of breast tissue was excised sharply  around the radioactive seed while checking the area of radioactivity frequently.  Once the specimen was removed it was oriented with the appropriate paint colors.  A specimen radiograph was obtained that showed the clip and seed to be near the center of the specimen.  The specimen was then sent to pathology for further evaluation.  Hemostasis was achieved using the Bovie electrocautery.  The wound was irrigated with saline and infiltrated with more quarter percent Marcaine.  The deep layer of the wound was then closed with layers of interrupted 3-0 Vicryl stitches.  The skin was closed with a running 4-0 Monocryl subcuticular stitch.  Dermabond dressings were applied.  The patient tolerated the procedure well.  At the end of the case all needle sponge and instrument counts were correct.  The patient was then awakened and taken to recovery in stable condition.  PLAN OF CARE: Discharge to home after PACU  PATIENT DISPOSITION:  PACU - hemodynamically stable.   Delay start of Pharmacological VTE agent (>24hrs) due to surgical blood loss or risk of bleeding: not applicable

## 2019-07-09 ENCOUNTER — Encounter (HOSPITAL_BASED_OUTPATIENT_CLINIC_OR_DEPARTMENT_OTHER): Payer: Self-pay | Admitting: General Surgery

## 2019-07-09 LAB — SURGICAL PATHOLOGY

## 2019-07-14 ENCOUNTER — Encounter: Payer: Self-pay | Admitting: Family Medicine

## 2019-07-14 ENCOUNTER — Other Ambulatory Visit: Payer: Self-pay

## 2019-07-14 ENCOUNTER — Ambulatory Visit: Payer: Medicare Other | Admitting: Family Medicine

## 2019-07-14 ENCOUNTER — Ambulatory Visit: Payer: Self-pay | Admitting: Family Medicine

## 2019-07-14 VITALS — BP 130/70 | HR 84 | Temp 98.3°F | Resp 16 | Ht 61.0 in | Wt 148.8 lb

## 2019-07-14 DIAGNOSIS — Z87898 Personal history of other specified conditions: Secondary | ICD-10-CM | POA: Insufficient documentation

## 2019-07-14 DIAGNOSIS — J4520 Mild intermittent asthma, uncomplicated: Secondary | ICD-10-CM | POA: Diagnosis not present

## 2019-07-14 DIAGNOSIS — J3089 Other allergic rhinitis: Secondary | ICD-10-CM

## 2019-07-14 DIAGNOSIS — R040 Epistaxis: Secondary | ICD-10-CM | POA: Insufficient documentation

## 2019-07-14 NOTE — Patient Instructions (Addendum)
Asthma Continue Ventolin (albuterol) 2 puffs every 4 hours as needed for cough or wheeze For asthma flares, begin Asmanex 100-2 puffs twice a day for 2 weeks or until cough and wheeze free (sample provided)   Allergic rhinitis Continue saline nasal rinses as needed for nasal symptoms Continue saline nasal gel as needed for dry nostrils   Call the clinic if this treatment plan is not working well for you  Follow up in the office in 6 months or sooner if needed

## 2019-07-14 NOTE — Progress Notes (Signed)
[address] [place] [address] Dept: [phone]  FOLLOW UP NOTE  Patient ID: Samantha Shepard [facility], female    DOB: 12/01/1955  Age: 63 y.o. MRN: [id number] Date of Office Visit: 07/14/2019  Assessment  Chief Complaint: Allergic Rhinitis  and Asthma  HPI Samantha Shepard is a 63 year old female who presents to the clinic for a follow up visit. She reports her asthma has been well controlled with the exception of last week on Wednesday and Thursday when she was having a left breast lumpectomy surgical procedure performed. She reports that she felt short of breath and anxious before, during and after the procedure. She reports that she occasionally feels shortness of breath and dry cough with activity for which she uses albuterol once every other week and Asmanex 100 about 1 time a month. She reports that she has not had an episode of epistaxis since she has stopped using ear plugs at night about 2 months ago. She has also stopped using Dymista and started using nasal saline gel. She reports the sores in her nose have healed at this time. Allergic rhinitis is reported as well controlled with occasional post nasal drainage. She reports a frequent burning sensation in both nostrils which occurs when wearing the face covering for which she continues nasal saline gel with some relief. She is not currently using an antihistamine or steroid nasal spray. Her current medications are listed in the chart.    Drug Allergies:  Allergies  Allergen Reactions  . Sulfonamide Derivatives     REACTION: rash  . Codeine     REACTION: Excitability--can't sleep    Physical Exam: BP 130/70 (BP Location: Right Arm, Patient Position: Sitting, Cuff Size: Normal)   Pulse 84   Temp 98.3 F (36.8 C) (Oral)   Resp 16   Ht 5\' 1"  (1.549 m)   Wt 148 lb 13 oz (67.5 kg)   SpO2 100%   BMI 28.12 kg/m    Physical Exam Vitals signs reviewed.  Constitutional:      Appearance: Normal appearance.   HENT:     Head: Normocephalic and atraumatic.     Right Ear: Tympanic membrane normal.     Left Ear: Tympanic membrane normal.     Nose:     Comments: Bilateral nares erythematous with no nasal drainage noted. Septal deviation noted. Pharynx normal. Ears normal. Eyes normal.    Mouth/Throat:     Pharynx: Oropharynx is clear.  Eyes:     Conjunctiva/sclera: Conjunctivae normal.  Neck:     Musculoskeletal: Normal range of motion and neck supple.  Cardiovascular:     Rate and Rhythm: Normal rate and regular rhythm.     Heart sounds: Normal heart sounds. No murmur.  Pulmonary:     Effort: Pulmonary effort is normal.     Breath sounds: Normal breath sounds.     Comments: Lungs clear to auscultation Musculoskeletal: Normal range of motion.  Skin:    General: Skin is warm and dry.  Neurological:     Mental Status: She is alert and oriented to person, place, and time.  Psychiatric:        Mood and Affect: Mood normal.        Behavior: Behavior normal.        Thought Content: Thought content normal.        Judgment: Judgment normal.     Diagnostics: FVC 2.13, FEV1 1.72. Predicted FVC 2.80, predicted FEV1 2.14. Spirometry indicates mild restriction. This is  consistent with previous spirometry readings.   Assessment and Plan: 1. Mild intermittent asthma without complication   2. Other allergic rhinitis   3. History of epistaxis     Patient Instructions  Asthma Continue Ventolin (albuterol) 2 puffs every 4 hours as needed for cough or wheeze For asthma flares, begin Asmanex 100-2 puffs twice a day for 2 weeks or until cough and wheeze free (sample provided)   Allergic rhinitis Continue saline nasal rinses as needed for nasal symptoms Continue saline nasal gel as needed for dry nostrils   Call the clinic if this treatment plan is not working well for you  Follow up in the office in 6 months or sooner if needed  Thank you for the opportunity to care for this patient.  Please  do not hesitate to contact me with questions.  Gareth Morgan, FNP [facility]  I have provided oversight concerning Gareth Morgan' evaluation and treatment of this patient's health issues addressed during today's encounter. I agree with the assessment and therapeutic plan as outlined in the note.    Return in about 6 months (around 01/12/2020), or if symptoms worsen or fail to improve.   Thank you for the opportunity to care for this patient.  Please do not hesitate to contact me with questions.  Gareth Morgan, FNP [facility]  I have provided oversight concerning Gareth Morgan' evaluation and treatment of this patient's health issues addressed during today's encounter. I agree with the assessment and therapeutic plan as outlined in the note.   Thank you for the opportunity to care for this patient.  Please do not hesitate to contact me with questions.  Penne Lash, M.D.  [facility] [address] [place], [place] [address] [phone]

## 2019-07-20 NOTE — Progress Notes (Signed)
Virtual Visit via telephone note  I connected with Samantha Shepard on 07/21/19 at  8:15 AM EDT by telephone that I am speaking with the correct person using two identifiers.   I discussed the limitations of evaluation and management by telemedicine and the availability of in person appointments. The patient expressed understanding and agreed to proceed.  The patient is currently at home and I am in the office.    No referring provider.    History of Present Illness: This is an acute visit for problems with her feet and legs.   For the past 2 months she has been experiencing tingling, burning sensation and her feet feeling extremely hot.  She is always had hot feet, but this is much worse.  She feels it in her lower legs as well.  The symptoms will wake her up 3-4 times a night.  She is fatigued because of this.  She takes the clonazepam half of a pill some nights and that does seem to help a little.  Walking helps a little.  She does get relief with putting a pillow between her legs.  She does have a sensation of wanting to move her legs.  She does have some lower back pain at times.  She does have a history of lumbar radiculopathy.  She denies a history of iron deficiency.  She did try taking a vitamin B12 supplement, but has not seen any improvement.   Review of Systems  Constitutional: Positive for malaise/fatigue. Negative for fever.  Musculoskeletal: Positive for back pain.  Neurological: Positive for tingling. Negative for dizziness and headaches.      Social History   Socioeconomic History  . Marital status: Single    Spouse name: Not on file  . Number of children: Not on file  . Years of education: Not on file  . Highest education level: Not on file  Occupational History  . Not on file  Social Needs  . Financial resource strain: Not on file  . Food insecurity    Worry: Not on file    Inability: Not on file  . Transportation needs    Medical: Not on file   Non-medical: Not on file  Tobacco Use  . Smoking status: Never Smoker  . Smokeless tobacco: Never Used  Substance and Sexual Activity  . Alcohol use: Yes    Alcohol/week: 7.0 standard drinks    Types: 7 Glasses of wine per week    Comment: wine   . Drug use: No  . Sexual activity: Not on file  Lifestyle  . Physical activity    Days per week: Not on file    Minutes per session: Not on file  . Stress: Not on file  Relationships  . Social Herbalist on phone: Not on file    Gets together: Not on file    Attends religious service: Not on file    Active member of club or organization: Not on file    Attends meetings of clubs or organizations: Not on file    Relationship status: Not on file  Other Topics Concern  . Not on file  Social History Narrative   Exercise: regular      Assessment and Plan:  See Problem List for Assessment and Plan of chronic medical problems.   Follow Up Instructions:    I discussed the assessment and treatment plan with the patient. The patient was provided an opportunity to ask questions and all were answered.  The patient agreed with the plan and demonstrated an understanding of the instructions.   The patient was advised to call back or seek an in-person evaluation if the symptoms worsen or if the condition fails to improve as anticipated.  Time spent on telephone call: 10 minutes  Binnie Rail, MD

## 2019-07-21 ENCOUNTER — Ambulatory Visit (INDEPENDENT_AMBULATORY_CARE_PROVIDER_SITE_OTHER): Payer: Medicare Other | Admitting: Internal Medicine

## 2019-07-21 ENCOUNTER — Encounter: Payer: Self-pay | Admitting: Internal Medicine

## 2019-07-21 ENCOUNTER — Ambulatory Visit: Payer: Medicare Other | Admitting: Internal Medicine

## 2019-07-21 DIAGNOSIS — R202 Paresthesia of skin: Secondary | ICD-10-CM

## 2019-07-21 MED ORDER — GABAPENTIN 100 MG PO CAPS: 100.0000 mg | ORAL_CAPSULE | Freq: Every day | ORAL | 3 refills | Status: DC

## 2019-07-21 NOTE — Assessment & Plan Note (Signed)
Tingling, burning sensation, hot feeling in both feet and legs Occurs at night Once to move them-walking helps a little Wakes her up 3-4 times a night Possible RLS Clonazepam helps some-takes a half a pill some nights Will try gabapentin 100-300 mg at night Can consider RLS medication depending on response to gabapentin Will refer to neurology for further evaluation

## 2019-07-27 ENCOUNTER — Encounter: Payer: Self-pay | Admitting: Neurology

## 2019-07-31 ENCOUNTER — Encounter (INDEPENDENT_AMBULATORY_CARE_PROVIDER_SITE_OTHER): Payer: Self-pay

## 2019-08-28 ENCOUNTER — Other Ambulatory Visit: Payer: Self-pay

## 2019-08-31 ENCOUNTER — Other Ambulatory Visit: Payer: Self-pay

## 2019-08-31 ENCOUNTER — Encounter: Payer: Self-pay | Admitting: Neurology

## 2019-08-31 ENCOUNTER — Ambulatory Visit: Payer: Medicare Other | Admitting: Neurology

## 2019-08-31 ENCOUNTER — Other Ambulatory Visit (INDEPENDENT_AMBULATORY_CARE_PROVIDER_SITE_OTHER): Payer: Medicare Other

## 2019-08-31 VITALS — BP 160/98 | HR 84 | Ht 62.0 in | Wt 152.0 lb

## 2019-08-31 DIAGNOSIS — G2581 Restless legs syndrome: Secondary | ICD-10-CM

## 2019-08-31 DIAGNOSIS — I10 Essential (primary) hypertension: Secondary | ICD-10-CM

## 2019-08-31 NOTE — Patient Instructions (Addendum)
Check lab  Continue gabapentin 100mg  at bedtime

## 2019-08-31 NOTE — Progress Notes (Signed)
[facility] Clinic Note - Initial Visit   Date: 08/31/19  Samantha Shepard [facility] MRN: [id number] DOB: 09-May-1956   Dear Dr. Quay Shepard:   Thank you for your kind referral of Samantha Shepard [facility] for consultation of restless leg syndrome. Although her history is well known to you, please allow [place] to reiterate it for the purpose of our medical record. The patient was accompanied to the clinic by self.    History of Present Illness: Samantha Shepard is a 63 y.o. right-handed female with hypertension presenting for evaluation of restless leg syndrome.   Starting in summer, she began having restless sensation of the legs when at rest and especially when sleeping.  She endorses urge to move the legs and would often get up to walk around, which helped some. She was started on gabapentin 100mg  at bedtime which provides significant relief.    She complains of hot sensation and redness of the feet since childhood.  It is worse during the summer.  Tight constrictive shows makes her symptoms worse.  She always wears sandles in he summer which provides relief.  When cold, the feet will change color to purple.   Out-side paper records, electronic medical record, and images have been reviewed where available and summarized as:   Lab Results  Component Value Date   HGBA1C 5.6 12/09/2018   No results found for: VITAMINB12 Lab Results  Component Value Date   TSH 1.14 12/09/2018   No results found for: ESRSEDRATE, POCTSEDRATE  Past Medical History:  Diagnosis Date  . Allergy    dust, cats  . Anxiety   . Asthma    controlled  . Hyperlipidemia   . Hypertension   . Left breast mass   . Migraines     Past Surgical History:  Procedure Laterality Date  . BREAST LUMPECTOMY WITH RADIOACTIVE SEED LOCALIZATION Left 07/08/2019   Procedure: LEFT BREAST LUMPECTOMY WITH RADIOACTIVE SEED LOCALIZATION;  Surgeon: Samantha Kussmaul, MD;  Location: [facility];  Service: General;  Laterality: Left;  . COLONOSCOPY  2009   neg; [facility]  . thyroid cyst aspirated    . TONSILLECTOMY       Medications:  Outpatient Encounter Medications as of 08/31/2019  Medication Sig  . albuterol (VENTOLIN HFA) 108 (90 Base) MCG/ACT inhaler INHALE 2 PUFFS INTO THE LUNGS EVERY 4 (FOUR) HOURS AS NEEDED FOR WHEEZING OR SHORTNESS OF BREATH.  . Azelastine-Fluticasone 137-50 MCG/ACT SUSP SHAKE LIQUID AND USE 2 SPRAYS IN EACH NOSTRIL DAILY  . cholecalciferol (VITAMIN D3) 25 MCG (1000 UT) tablet Take 1,000 Units by mouth daily.  Marland Kitchen doxycycline (VIBRAMYCIN) 100 MG capsule Take 100 mg by mouth 2 (two) times daily.  Marland Kitchen estradiol-norethindrone (ACTIVELLA) 1-0.5 MG tablet Take 1 mg by mouth every other day.  . gabapentin (NEURONTIN) 100 MG capsule Take 1-3 capsules (100-300 mg total) by mouth at bedtime.  Marland Kitchen ipratropium (ATROVENT) 0.03 % nasal spray Place 2 sprays into both nostrils 3 (three) times daily.  Marland Kitchen losartan (COZAAR) 25 MG tablet Take 25 mg by mouth daily. Takes  2 tablets every morning  . magnesium 30 MG tablet Take 30 mg by mouth 2 (two) times daily.  . mometasone (ASMANEX, 120 METERED DOSES,) 220 MCG/INH inhaler Inhale 2 puffs into the lungs daily.  . Multiple Vitamin (MULTIVITAMIN WITH MINERALS) TABS tablet Take 1 tablet by mouth daily.  . traMADol (ULTRAM) 50 MG tablet Take 1-2 tablets (50-100 mg total) by mouth every 6 (six) hours as  needed.  . clonazePAM (KLONOPIN) 0.5 MG tablet TAKE 1 TABLET BY MOUTH AT BEDTIME AS NEEDED FOR ANXIETY OR TMJ (Patient not taking: Reported on 08/31/2019)   No facility-administered encounter medications on file as of 08/31/2019.     Allergies:  Allergies  Allergen Reactions  . Sulfonamide Derivatives     REACTION: rash  . Codeine     REACTION: Excitability--can't sleep    Family History: Family History  Problem Relation Age of Onset  . Heart disease Mother        CABG 3 vessel @ 36  . Hypertension Mother   .  Hyperlipidemia Mother   . Diabetes Mother   . Stroke Mother 54  . Prostate cancer Father   . Hypertension Father   . Breast cancer Sister   . Hyperlipidemia Sister   . Allergic rhinitis Neg Hx   . Asthma Neg Hx   . Angioedema Neg Hx   . Eczema Neg Hx   . Immunodeficiency Neg Hx   . Urticaria Neg Hx     Social History: Social History   Tobacco Use  . Smoking status: Never Smoker  . Smokeless tobacco: Never Used  Substance Use Topics  . Alcohol use: Yes    Alcohol/week: 7.0 standard drinks    Types: 7 Glasses of wine per week    Comment: wine   . Drug use: No   Social History   Social History Narrative   Exercise: regular      Right handed      One story home    Review of Systems:  CONSTITUTIONAL: No fevers, chills, night sweats, or weight loss.   EYES: No visual changes or eye pain ENT: No hearing changes.  No history of nose bleeds.   RESPIRATORY: No cough, wheezing and shortness of breath.   CARDIOVASCULAR: Negative for chest pain, and palpitations.   GI: Negative for abdominal discomfort, blood in stools or black stools.  No recent change in bowel habits.   GU:  No history of incontinence.   MUSCLOSKELETAL: No history of joint pain or swelling.  No myalgias.   SKIN: Negative for lesions, rash, and itching.   HEMATOLOGY/ONCOLOGY: Negative for prolonged bleeding, bruising easily, and swollen nodes.  No history of cancer.   ENDOCRINE: Negative for cold or heat intolerance, polydipsia or goiter.   PSYCH:  No depression or anxiety symptoms.   NEURO: As Above.   Vital Signs:  BP (!) 164/94   Pulse 84   Ht 5\' 2"  (1.575 m)   Wt 152 lb (68.9 kg)   SpO2 100%   BMI 27.80 kg/m    General Medical Exam:   General:  Well appearing, comfortable.   Eyes/ENT: see cranial nerve examination.   Neck:   No carotid bruits. Respiratory:  Clear to auscultation, good air entry bilaterally.   Cardiac:  Regular rate and rhythm, no murmur.   Extremities:  Erythema of the  hands and feet.  No deformities, edema, or skin discoloration.  Skin:  No rashes or lesions.  Neurological Exam: MENTAL STATUS including orientation to time, place, person, recent and remote memory, attention span and concentration, language, and fund of knowledge is normal.  Speech is not dysarthric.  CRANIAL NERVES: II:  No visual field defects.     III-IV-VI: Pupils equal round and reactive to light.  Normal conjugate, extra-ocular eye movements in all directions of gaze.  No nystagmus.  No ptosis.   V:  Normal facial sensation.    VII:  Normal facial symmetry and movements.   VIII:  Normal hearing and vestibular function.   IX-X:  Normal palatal movement.   XI:  Normal shoulder shrug and head rotation.   XII:  Normal tongue strength and range of motion, no deviation or fasciculation.  MOTOR:  No atrophy, fasciculations or abnormal movements.  No pronator drift.   Upper Extremity:  Right  Left  Deltoid  5/5   5/5   Biceps  5/5   5/5   Triceps  5/5   5/5   Infraspinatus 5/5  5/5  Medial pectoralis 5/5  5/5  Wrist extensors  5/5   5/5   Wrist flexors  5/5   5/5   Finger extensors  5/5   5/5   Finger flexors  5/5   5/5   Dorsal interossei  5/5   5/5   Abductor pollicis  5/5   5/5   Tone (Ashworth scale)  0  0   Lower Extremity:  Right  Left  Hip flexors  5/5   5/5   Hip extensors  5/5   5/5   Adductor 5/5  5/5  Abductor 5/5  5/5  Knee flexors  5/5   5/5   Knee extensors  5/5   5/5   Dorsiflexors  5/5   5/5   Plantarflexors  5/5   5/5   Toe extensors  5/5   5/5   Toe flexors  5/5   5/5   Tone (Ashworth scale)  0  0   MSRs:  Right        Left                  brachioradialis 2+  2+  biceps 2+  2+  triceps 2+  2+  patellar 2+  2+  ankle jerk 2+  2+  Hoffman no  no  plantar response down  down   SENSORY:  Normal and symmetric perception of light touch, pinprick, vibration, and proprioception.  Romberg's sign absent.   COORDINATION/GAIT: Normal finger-to-  nose-finger and heel-to-shin.  Intact rapid alternating movements bilaterally.  Able to rise from a chair without using arms.  Gait narrow based and stable. Tandem and stressed gait intact.    IMPRESSION: 1. Restless leg syndrome, improved on gabapentin  - Check ferritin  - Continue gabapentin 100mg  at bedtime, ok to titrate as needed  2. Erythema and warmth sensation of the feet, unclear. No evidence on exam to suggest this is due to peripheral neuropathy.   3.  Elevated BP.  Asymptomatic rechecked and still 160/90.  Monitor at home and follow-up with PCP if it remains elevated  Thank you for allowing me to participate in patient's care.  If I can answer any additional questions, I would be pleased to do so.    Sincerely,    Nuh Lipton K. Posey Pronto, DO

## 2019-09-01 LAB — FERRITIN: Ferritin: 544 ng/mL — ABNORMAL HIGH (ref 16–288)

## 2019-09-02 ENCOUNTER — Other Ambulatory Visit: Payer: Self-pay

## 2019-09-02 ENCOUNTER — Ambulatory Visit (INDEPENDENT_AMBULATORY_CARE_PROVIDER_SITE_OTHER): Payer: Medicare Other

## 2019-09-02 DIAGNOSIS — Z23 Encounter for immunization: Secondary | ICD-10-CM

## 2019-09-09 ENCOUNTER — Other Ambulatory Visit: Payer: Self-pay | Admitting: Family Medicine

## 2019-09-09 ENCOUNTER — Ambulatory Visit: Payer: Self-pay | Admitting: Internal Medicine

## 2019-09-09 NOTE — Progress Notes (Signed)
Subjective:    Patient ID: Samantha Shepard, female    DOB: 1956/07/15, 63 y.o.   MRN: [id number]  HPI The patient is here for an acute visit.  She saw the neurologist recently and her BP was elevated.  Her BP at home was 150-160's.  Yesterday she doubled her losartan and her blood pressure improved significantly.  Today she only took 1 dose.  She has had some mild, intermittent headaches and lightheadedness.  She has chronic palpitations that occur on occasion, but this is not new.  She has not had any chest pain, shortness of breath or leg swelling.  She denies any changes in lifestyle that would account for the elevated blood pressure.       Medications and allergies reviewed with patient and updated if appropriate.  Patient Active Problem List   Diagnosis Date Noted  . Mild intermittent asthma without complication [id number]  . History of epistaxis 07/14/2019  . Temporomandibular joint (TMJ) pain 06/16/2018  . Anxiety 06/16/2018  . Cervical adenopathy 06/16/2018  . 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 Prediabetes 08/01/2017  . Tingling of both feet 04/18/2016  . Osteopenia 01/15/2016  . Mild persistent asthma 01/10/2016  . Other allergic rhinitis 01/10/2016  . Hyperlipidemia 08/13/2008  . Migraine headache 09/22/2007  . Essential hypertension 09/22/2007  . Allergic rhinitis 05/26/2007    Current Outpatient Medications on File Prior to Visit  Medication Sig Dispense Refill  . albuterol (VENTOLIN HFA) 108 (90 Base) MCG/ACT inhaler INHALE 2 PUFFS INTO THE LUNGS EVERY 4 (FOUR) HOURS AS NEEDED FOR WHEEZING OR SHORTNESS OF BREATH. 8 g 2  . Azelastine-Fluticasone 137-50 MCG/ACT SUSP SHAKE LIQUID AND USE 2 SPRAYS IN EACH NOSTRIL DAILY 23 g 1  . cholecalciferol (VITAMIN D3) 25 MCG (1000 UT) tablet Take 1,000 Units by mouth daily.    Marland Kitchen doxycycline (VIBRAMYCIN) 100 MG capsule Take 100 mg by mouth 2 (two) times daily.    Marland Kitchen estradiol-norethindrone (ACTIVELLA) 1-0.5 MG tablet Take 1  mg by mouth every other day.    . gabapentin (NEURONTIN) 100 MG capsule Take 1-3 capsules (100-300 mg total) by mouth at bedtime. 90 capsule 3  . ipratropium (ATROVENT) 0.03 % nasal spray USE 2 SPRAYS IN EACH NOSTRIL THREE TIMES DAILY 30 mL 12  . magnesium 30 MG tablet Take 30 mg by mouth 2 (two) times daily.    . mometasone (ASMANEX, 120 METERED DOSES,) 220 MCG/INH inhaler Inhale 2 puffs into the lungs daily.    . Multiple Vitamin (MULTIVITAMIN WITH MINERALS) TABS tablet Take 1 tablet by mouth daily.    . traMADol (ULTRAM) 50 MG tablet Take 1-2 tablets (50-100 mg total) by mouth every 6 (six) hours as needed. 20 tablet 0   No current facility-administered medications on file prior to visit.     Past Medical History:  Diagnosis Date  . Allergy    dust, cats  . Anxiety   . Asthma    controlled  . Hyperlipidemia   . Hypertension   . Left breast mass   . Migraines     Past Surgical History:  Procedure Laterality Date  . BREAST LUMPECTOMY WITH RADIOACTIVE SEED LOCALIZATION Left 07/08/2019   Procedure: LEFT BREAST LUMPECTOMY WITH RADIOACTIVE SEED LOCALIZATION;  Surgeon: Jovita Kussmaul, MD;  Location: [facility];  Service: General;  Laterality: Left;  . COLONOSCOPY  2009   neg; [facility]  . thyroid cyst aspirated    . TONSILLECTOMY      Social History  Socioeconomic History  . Marital status: Single    Spouse name: Not on file  . Number of children: 0  . Years of education: 53  . Highest education level: Not on file  Occupational History  . Occupation: retired  Scientific laboratory technician  . Financial resource strain: Not on file  . Food insecurity    Worry: Not on file    Inability: Not on file  . Transportation needs    Medical: Not on file    Non-medical: Not on file  Tobacco Use  . Smoking status: Never Smoker  . Smokeless tobacco: Never Used  Substance and Sexual Activity  . Alcohol use: Yes    Alcohol/week: 7.0 standard drinks    Types: 7 Glasses of wine per  week    Comment: wine   . Drug use: No  . Sexual activity: Not on file  Lifestyle  . Physical activity    Days per week: Not on file    Minutes per session: Not on file  . Stress: Not on file  Relationships  . Social Herbalist on phone: Not on file    Gets together: Not on file    Attends religious service: Not on file    Active member of club or organization: Not on file    Attends meetings of clubs or organizations: Not on file    Relationship status: Not on file  Other Topics Concern  . Not on file  Social History Narrative   Exercise: regular      Right handed      One story home    Family History  Problem Relation Age of Onset  . Heart disease Mother        CABG 3 vessel @ 44  . Hypertension Mother   . Hyperlipidemia Mother   . Diabetes Mother   . Stroke Mother 45  . Prostate cancer Father   . Hypertension Father   . Breast cancer Sister   . Hyperlipidemia Sister   . Allergic rhinitis Neg Hx   . Asthma Neg Hx   . Angioedema Neg Hx   . Eczema Neg Hx   . Immunodeficiency Neg Hx   . Urticaria Neg Hx     Review of Systems  Constitutional: Negative for fever.  Respiratory: Negative for shortness of breath.   Cardiovascular: Positive for palpitations (occ, chronic, no change). Negative for chest pain and leg swelling.  Neurological: Positive for light-headedness (mild, transient) and headaches (mild, transient).       Objective:   Vitals:   09/10/19 0846  BP: (!) 148/88  Pulse: 76  Temp: 98.5 F (36.9 C)  SpO2: 99%   BP Readings from Last 3 Encounters:  09/10/19 (!) 148/88  08/31/19 (!) 160/98  07/14/19 130/70   Wt Readings from Last 3 Encounters:  09/10/19 151 lb (68.5 kg)  08/31/19 152 lb (68.9 kg)  07/14/19 148 lb 13 oz (67.5 kg)   Body mass index is 27.62 kg/m.   Physical Exam    Constitutional: Appears well-developed and well-nourished. No distress.  HENT:  Head: Normocephalic and atraumatic.  Neck: Neck supple. No  tracheal deviation present. No thyromegaly present.  No cervical lymphadenopathy Cardiovascular: Normal rate, regular rhythm and normal heart sounds.   No murmur heard. No carotid bruit .  No edema Pulmonary/Chest: Effort normal and breath sounds normal. No respiratory distress. No has no wheezes. No rales.  Skin: Skin is warm and dry. Not diaphoretic.  Psychiatric: Normal  mood and affect. Behavior is normal.       Assessment & Plan:    See Problem List for Assessment and Plan of chronic medical problems.     This visit occurred during the SARS-CoV-2 public health emergency.  Safety protocols were in place, including screening questions prior to the visit, additional usage of staff PPE, and extensive cleaning of exam room while observing appropriate contact time as indicated for disinfecting solutions.

## 2019-09-09 NOTE — Patient Instructions (Addendum)
  Medications reviewed and updated.  Changes include :   Increase losartan 100 mg daily  Your prescription(s) have been submitted to your pharmacy. Please take as directed and contact our office if you believe you are having problem(s) with the medication(s).   Please followup in 6 months

## 2019-09-09 NOTE — Telephone Encounter (Signed)
Pt stated her BP was elevated last night reading 157/90. This morning it was 166/95 and she doubled her BP medication and it read 128/78. Her most recent reading was 135/85. She would like to speak with a nurse about the elevation. Please advise.      Pt called back in regards to her b/p being elevated when she visited the neurologist last week . It was 166/94.  Last night it was 144/85 and this morning it was 134/82. She rechecked her b/p and it is now 150/86 hr 84 and 134/85 and hr 75. She sometimes will get a little short of breath and feel a little funny when it is going up. She denies any other symptoms. She has doubled up on her losartan to 100 mg daily instead of 50. She also has recently been prescribed gabapentin and feels like it may be causing her b/p to be elevated. Per protocol, she should be seen with 2 weeks. She would like an appointment sooner.  [facility] notified for an appointment. Call conference in with the practice.   Reason for Disposition . [id number] Systolic BP  >= [id number] OR Diastolic >= 80 AND [id number] taking BP medications  Answer Assessment - Initial Assessment Questions 1. BLOOD PRESSURE: "What is the blood pressure?" "Did you take at least two measurements 5 minutes apart?"     150/86 hr 84 and 134/85 hr 75 2. ONSET: "When did you take your blood pressure?"     Right now 3. HOW: "How did you obtain the blood pressure?" (e.g., visiting nurse, automatic home BP monitor)     Automatic home b/p monitor 4. HISTORY: "Do you have a history of high blood pressure?"     yes 5. MEDICATIONS: "Are you taking any medications for blood pressure?" "Have you missed any doses recently?"     Yes and has doubled up on this medication 6. OTHER SYMPTOMS: "Do you have any symptoms?" (e.g., headache, chest pain, blurred vision, difficulty breathing, weakness)     Feeling funny 7. PREGNANCY: "Is there any chance you are pregnant?" "When was your last menstrual period?"      n/a  Protocols used: HIGH BLOOD PRESSURE-A-AH

## 2019-09-10 ENCOUNTER — Ambulatory Visit (INDEPENDENT_AMBULATORY_CARE_PROVIDER_SITE_OTHER): Payer: Medicare Other | Admitting: Internal Medicine

## 2019-09-10 ENCOUNTER — Encounter: Payer: Self-pay | Admitting: Internal Medicine

## 2019-09-10 ENCOUNTER — Other Ambulatory Visit: Payer: Self-pay

## 2019-09-10 VITALS — BP 148/88 | HR 76 | Temp 98.5°F | Ht 62.0 in | Wt 151.0 lb

## 2019-09-10 DIAGNOSIS — I10 Essential (primary) hypertension: Secondary | ICD-10-CM

## 2019-09-10 MED ORDER — LOSARTAN POTASSIUM 100 MG PO TABS: 100.0000 mg | ORAL_TABLET | Freq: Every day | ORAL | 1 refills | Status: DC

## 2019-09-10 NOTE — Assessment & Plan Note (Signed)
BP not controlled Increase losartan to 100 mg  Monitor BP at home Follow up in 6 months, sooner if BP not controlled

## 2019-09-26 ENCOUNTER — Other Ambulatory Visit: Payer: Self-pay | Admitting: Family Medicine

## 2019-09-29 ENCOUNTER — Other Ambulatory Visit: Payer: Self-pay

## 2019-09-29 ENCOUNTER — Encounter: Payer: Self-pay | Admitting: Internal Medicine

## 2019-09-29 ENCOUNTER — Ambulatory Visit (INDEPENDENT_AMBULATORY_CARE_PROVIDER_SITE_OTHER): Payer: Medicare Other | Admitting: Internal Medicine

## 2019-09-29 DIAGNOSIS — J0120 Acute ethmoidal sinusitis, unspecified: Secondary | ICD-10-CM | POA: Diagnosis not present

## 2019-09-29 MED ORDER — PREDNISONE 20 MG PO TABS: 20.0000 mg | ORAL_TABLET | Freq: Every day | ORAL | 0 refills | Status: DC

## 2019-09-29 MED ORDER — CEFDINIR 300 MG PO CAPS: 300.0000 mg | ORAL_CAPSULE | Freq: Two times a day (BID) | ORAL | 0 refills | Status: DC

## 2019-09-29 NOTE — Progress Notes (Signed)
Virtual Visit via Video Note  I connected with Samantha Shepard on 09/29/19 at 10:30 AM EST by a video enabled telemedicine application and verified that I am speaking with the correct person using two identifiers.   I discussed the limitations of evaluation and management by telemedicine and the availability of in person appointments. The patient expressed understanding and agreed to proceed.  Present for the visit:  Myself, Dr Billey Gosling, Pryor Curia.  The patient is currently at home and I am in the office.    No referring provider.    History of Present Illness: This visit is an acute visit for cold symptoms.   Her symptoms started about one week ago  She is experiencing nasal congestion, rhinorrhea, sinus pressure and mild headaches.  She has not had any fevers, body aches, sore throat, cough, wheeze or shortness of breath.  [place] has been dealing with wood-burning stove's and different types of heat and she thinks that is potentially what caused some of the symptoms.  She has tried a couple of different nasal sprays-saline and Dymista without much improvement she has had this in the past and has needed some steroids and an antibiotic, which typically works well.  She has tried taking Ayr saline spray, zyrtec, other nasal spray   Review of Systems  Constitutional: Negative for fever.  HENT: Positive for congestion and sinus pain (pressure). Negative for ear pain and sore throat.   Respiratory: Negative for cough, shortness of breath and wheezing.   Neurological: Positive for headaches (mild).     Social History   Socioeconomic History  . Marital status: Single    Spouse name: Not on file  . Number of children: 0  . Years of education: 37  . Highest education level: Not on file  Occupational History  . Occupation: retired  Tobacco Use  . Smoking status: Never Smoker  . Smokeless tobacco: Never Used  Substance and  Sexual Activity  . Alcohol use: Yes    Alcohol/week: 7.0 standard drinks    Types: 7 Glasses of wine per week    Comment: wine   . Drug use: No  . Sexual activity: Not on file  Other Topics Concern  . Not on file  Social History Narrative   Exercise: regular      Right handed      One story home   Social Determinants of Health   Financial Resource Strain:   . Difficulty of Paying Living Expenses: Not on file  Food Insecurity:   . Worried About Charity fundraiser in the Last Year: Not on file  . Ran Out of Food in the Last Year: Not on file  Transportation Needs:   . Lack of Transportation (Medical): Not on file  . Lack of Transportation (Non-Medical): Not on file  Physical Activity:   . Days of Exercise per Week: Not on file  . Minutes of Exercise per Session: Not on file  Stress:   . Feeling of Stress : Not on file  Social Connections:   . Frequency of Communication with Friends and Family: Not on file  . Frequency of Social Gatherings with Friends and Family: Not on file  . Attends Religious Services: Not on file  . Active Member of Clubs or Organizations: Not on file  . Attends Archivist Meetings: Not on file  . Marital Status: Not on file     Observations/Objective: Appears well in  NAD Breathing normally Skin appears warm and dry  Assessment and Plan:  See Problem List for Assessment and Plan of chronic medical problems.   Follow Up Instructions:    I discussed the assessment and treatment plan with the patient. The patient was provided an opportunity to ask questions and all were answered. The patient agreed with the plan and demonstrated an understanding of the instructions.   The patient was advised to call back or seek an in-person evaluation if the symptoms worsen or if the condition fails to improve as anticipated.    Binnie Rail, MD

## 2019-09-29 NOTE — Assessment & Plan Note (Addendum)
Likely bacterial  Start omnicef Prednisone Saline spray, humidifier otc cold medications Rest, fluid Call if no improvement

## 2019-10-13 DIAGNOSIS — Z20828 Contact with and (suspected) exposure to other viral communicable diseases: Secondary | ICD-10-CM | POA: Diagnosis not present

## 2019-10-27 ENCOUNTER — Ambulatory Visit: Payer: Medicare PPO | Admitting: Pediatrics

## 2019-10-27 ENCOUNTER — Other Ambulatory Visit: Payer: Self-pay

## 2019-10-27 ENCOUNTER — Encounter: Payer: Self-pay | Admitting: Pediatrics

## 2019-10-27 VITALS — BP 142/72 | HR 72 | Temp 98.2°F | Resp 18 | Ht 61.3 in | Wt 152.2 lb

## 2019-10-27 DIAGNOSIS — J3089 Other allergic rhinitis: Secondary | ICD-10-CM | POA: Diagnosis not present

## 2019-10-27 DIAGNOSIS — J4520 Mild intermittent asthma, uncomplicated: Secondary | ICD-10-CM

## 2019-10-27 DIAGNOSIS — I10 Essential (primary) hypertension: Secondary | ICD-10-CM | POA: Diagnosis not present

## 2019-10-27 MED ORDER — MONTELUKAST SODIUM 10 MG PO TABS: ORAL_TABLET | ORAL | 5 refills | Status: DC

## 2019-10-27 MED ORDER — ASMANEX (120 METERED DOSES) 220 MCG/INH IN AEPB: INHALATION_SPRAY | RESPIRATORY_TRACT | 5 refills | Status: DC

## 2019-10-27 NOTE — Progress Notes (Signed)
[address] [place] [address] Dept: [phone]  FOLLOW UP NOTE  Patient ID: Samantha Shepard [facility], female    DOB: 03-16-1956  Age: 64 y.o. MRN: [id number] Date of Office Visit: 10/27/2019  Assessment  Chief Complaint: Asthma (cough, wheeze and nasal congestion.  Sx wose when goes to their house in [place].  Currently renovating the house, has wood stove and oil heat.  House being painted now. )  HPI Donella Anzivino Corlett presents for follow-up of asthma and allergic rhinitis.  Her asthma is usually well controlled and she does not have to use Asmanex 110.  [place]  They noted an area of water damage in her bedroom which is being repaired.  When [place] she has sneezing and coughing all night.  The symptoms continue until she leaves and then she gets better 2 days later.  She has not been using Asmanex when she visits [place] the [place].  Her nasal sprays did not seem to help her when she is there ,  once his symptoms have started   Drug Allergies:  Allergies  Allergen Reactions  . Sulfonamide Derivatives     REACTION: rash  . Codeine     REACTION: Excitability--can't sleep    Physical Exam: BP (!) 142/72 (BP Location: Left Arm, Patient Position: Sitting, Cuff Size: Normal)   Pulse 72   Temp 98.2 F (36.8 C) (Oral)   Resp 18   Ht 5' 1.3" (1.557 m)   Wt 152 lb 3.2 oz (69 kg)   SpO2 98%   BMI 28.48 kg/m    Physical Exam Constitutional:      Appearance: Normal appearance. She is normal weight.  HENT:     Head:     Comments: Eyes normal.  Ears normal.  Nose normal.  Pharynx normal. Cardiovascular:     Rate and Rhythm: Normal rate and regular rhythm.     Comments: S1-S2 normal no murmurs Pulmonary:     Comments: Clear to percussion and auscultation Musculoskeletal:     Cervical back: Neck supple.  Lymphadenopathy:     Cervical: No cervical adenopathy.    Neurological:     General: No focal deficit present.     Mental Status: She is alert and oriented to person, place, and time. Mental status is at baseline.  Psychiatric:        Mood and Affect: Mood normal.        Behavior: Behavior normal.        Thought Content: Thought content normal.        Judgment: Judgment normal.     Diagnostics: FVC 2.00 L FEV1 1.59 L.  Predicted FVC 2.80 L predicted FEV1 2.14 L-this shows a mild reduction in the forced vital capacity  Assessment and Plan: 1. Mild intermittent asthma without complication   2. Other allergic rhinitis   3. Essential hypertension     Meds ordered this encounter  Medications  . montelukast (SINGULAIR) 10 MG tablet    Sig: Take 1 tablet once a day when you are in the [place]    Dispense:  30 tablet    Refill:  5  . mometasone (ASMANEX, 120 METERED DOSES,) 220 MCG/INH inhaler    Sig: Two puffs once a day to prevent cough or wheeze.    Dispense:  1 Inhaler    Refill:  5    Patient Instructions  Zyrtec 10 mg-take  1 tablet once a day for runny nose or itchy eyes Azelastine 0.1% - 2 sprays per nostril twice a day when you are going to [place]  Asmanex 100-2 puffs once a day starting before you go to the mountains and during your stay there Montelukast 10 mg-take 1 tablet once a day when you are in the mountains  Ventolin 2 puffs every 4 hours if needed for wheezing or coughing spells.  You may use Ventolin 2 puffs 5 to 15 minutes before exercise  Continue on your other medications Call us if you are not doing well on this treatment plan   Return in about 3 months (around 01/25/2020).    Thank you for the opportunity to care for this patient.  Please do not hesitate to contact me with questions.  Penne Lash, M.D.  [facility] [address] [place], [place] [address] [phone]

## 2019-10-27 NOTE — Patient Instructions (Addendum)
Zyrtec 10 mg-take 1 tablet once a day for runny nose or itchy eyes Azelastine 0.1% - 2 sprays per nostril twice a day when you are going [place]  Asmanex 100-2 puffs once a day starting before you go to the mountains and during your stay there Montelukast 10 mg-take 1 tablet once a day when you are in the mountains  Ventolin 2 puffs every 4 hours if needed for wheezing or coughing spells.  You may use Ventolin 2 puffs 5 to 15 minutes before exercise  Continue on your other medications Call us if you are not doing well on this treatment plan

## 2019-10-28 ENCOUNTER — Other Ambulatory Visit: Payer: Self-pay | Admitting: Pediatrics

## 2019-10-28 NOTE — Telephone Encounter (Signed)
PT called to get asmanex inhaler refill sent to [facility] [address]

## 2019-10-28 NOTE — Telephone Encounter (Signed)
Sent in rx yesterday.  Via [employer].  Dr. Shaune Leeks signed and receipt confirmed by pharmacy.

## 2019-11-17 ENCOUNTER — Other Ambulatory Visit: Payer: Self-pay

## 2019-11-17 MED ORDER — FLOVENT HFA 220 MCG/ACT IN AERO: INHALATION_SPRAY | RESPIRATORY_TRACT | 5 refills | Status: DC

## 2019-11-17 NOTE — Telephone Encounter (Signed)
Informed pt of the change of medications she stated understanding and sent to her new pharmacy

## 2019-11-17 NOTE — Telephone Encounter (Signed)
Insurance will no longer cover the asmanex twisthaler 256mcg dr b stated send in flovent 220 hfa 2 puffs once a day of twice daily if needed sent in rx and will inform pt of the change

## 2019-11-24 DIAGNOSIS — N6022 Fibroadenosis of left breast: Secondary | ICD-10-CM | POA: Diagnosis not present

## 2020-01-19 ENCOUNTER — Other Ambulatory Visit: Payer: Self-pay | Admitting: Internal Medicine

## 2020-02-13 ENCOUNTER — Other Ambulatory Visit: Payer: Self-pay | Admitting: Internal Medicine

## 2020-03-01 DIAGNOSIS — Z6828 Body mass index (BMI) 28.0-28.9, adult: Secondary | ICD-10-CM | POA: Diagnosis not present

## 2020-03-01 DIAGNOSIS — Z01419 Encounter for gynecological examination (general) (routine) without abnormal findings: Secondary | ICD-10-CM | POA: Diagnosis not present

## 2020-03-10 ENCOUNTER — Other Ambulatory Visit: Payer: Self-pay | Admitting: Internal Medicine

## 2020-04-05 ENCOUNTER — Other Ambulatory Visit: Payer: Self-pay | Admitting: Internal Medicine

## 2020-04-20 DIAGNOSIS — M2041 Other hammer toe(s) (acquired), right foot: Secondary | ICD-10-CM | POA: Diagnosis not present

## 2020-04-20 DIAGNOSIS — L853 Xerosis cutis: Secondary | ICD-10-CM | POA: Diagnosis not present

## 2020-04-20 DIAGNOSIS — I7300 Raynaud's syndrome without gangrene: Secondary | ICD-10-CM | POA: Diagnosis not present

## 2020-04-23 ENCOUNTER — Other Ambulatory Visit: Payer: Self-pay | Admitting: Pediatrics

## 2020-05-04 ENCOUNTER — Other Ambulatory Visit: Payer: Self-pay | Admitting: Internal Medicine

## 2020-05-11 DIAGNOSIS — M79605 Pain in left leg: Secondary | ICD-10-CM | POA: Diagnosis not present

## 2020-05-11 DIAGNOSIS — M79604 Pain in right leg: Secondary | ICD-10-CM | POA: Diagnosis not present

## 2020-06-21 ENCOUNTER — Other Ambulatory Visit: Payer: Self-pay | Admitting: General Surgery

## 2020-06-21 ENCOUNTER — Other Ambulatory Visit: Payer: Self-pay | Admitting: Obstetrics and Gynecology

## 2020-06-21 DIAGNOSIS — Z1231 Encounter for screening mammogram for malignant neoplasm of breast: Secondary | ICD-10-CM

## 2020-06-22 ENCOUNTER — Ambulatory Visit
Admission: RE | Admit: 2020-06-22 | Discharge: 2020-06-22 | Disposition: A | Discharge status: Home / Self Care | Payer: Medicare Other | Source: Ambulatory Visit | Attending: General Surgery | Admitting: General Surgery

## 2020-06-22 ENCOUNTER — Other Ambulatory Visit: Payer: Self-pay

## 2020-06-22 DIAGNOSIS — Z1231 Encounter for screening mammogram for malignant neoplasm of breast: Secondary | ICD-10-CM | POA: Diagnosis not present

## 2020-06-28 DIAGNOSIS — N6022 Fibroadenosis of left breast: Secondary | ICD-10-CM | POA: Diagnosis not present

## 2020-06-28 DIAGNOSIS — L570 Actinic keratosis: Secondary | ICD-10-CM | POA: Diagnosis not present

## 2020-06-28 DIAGNOSIS — L820 Inflamed seborrheic keratosis: Secondary | ICD-10-CM | POA: Diagnosis not present

## 2020-06-28 DIAGNOSIS — X32XXXA Exposure to sunlight, initial encounter: Secondary | ICD-10-CM | POA: Diagnosis not present

## 2020-07-28 NOTE — Progress Notes (Signed)
Subjective:    Patient ID: Samantha Shepard, female    DOB: 01/17/1956, 64 y.o.   MRN: [id number]  HPI The patient is here for follow up of their chronic medical problems, including htn, anxiety, prediabetes, hyperlipidemia   She is taking all of her medications as prescribed.   She is exercising some, but not enough.  She is not compliant with a low sodium diet.    She has been taking 50 mg of the losartan - she increased it to 100 mg a few days ago.       BP at home has been elevated - 179/97, 177/93, 170's/91, this morning 134/85. She was not checking it prior to this.   She is not sleeping.  She uses gabapentin as needed for her feet but it does not help with her sleep.  Falls asleep ok, but wakes up at 2-3 and up until 4 or so.    Medications and allergies reviewed with patient and updated if appropriate.  Patient Active Problem List   Diagnosis Date Noted  . Mild intermittent asthma without complication 76/54/6503  . History of epistaxis 07/14/2019  . Temporomandibular joint (TMJ) pain 06/16/2018  . Anxiety 06/16/2018  . Cervical adenopathy 06/16/2018  . 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 Acute sinus infection 10/04/2017  . Prediabetes 08/01/2017  . Tingling of both feet 04/18/2016  . Osteopenia 01/15/2016  . Mild persistent asthma 01/10/2016  . Other allergic rhinitis 01/10/2016  . Hyperlipidemia 08/13/2008  . Migraine headache 09/22/2007  . Uncontrolled hypertension 09/22/2007  . Allergic rhinitis 05/26/2007    Current Outpatient Medications on File Prior to Visit  Medication Sig Dispense Refill  . albuterol (VENTOLIN HFA) 108 (90 Base) MCG/ACT inhaler INHALE 2 PUFFS INTO THE LUNGS EVERY 4 (FOUR) HOURS AS NEEDED FOR WHEEZING OR SHORTNESS OF BREATH. 18 g 1  . cholecalciferol (VITAMIN D3) 25 MCG (1000 UT) tablet Take 1,000 Units by mouth daily.    . clonazePAM (KLONOPIN) 0.5 MG tablet clonazepam 0.5 mg tablet  TAKE 1 TABLET BY MOUTH AT BEDTIME AS NEEDED FOR  ANXIETY OR TMJ    . estradiol-norethindrone (ACTIVELLA) 1-0.5 MG tablet Take 1 mg by mouth every other day.    . gabapentin (NEURONTIN) 100 MG capsule Take 1-3 capsules (100-300 mg total) by mouth at bedtime. 90 capsule 3  . magnesium 30 MG tablet Take 30 mg by mouth 2 (two) times daily.    . mometasone (ASMANEX, 120 METERED DOSES,) 220 MCG/INH inhaler Two puffs once a day to prevent cough or wheeze. 1 Inhaler 5  . montelukast (SINGULAIR) 10 MG tablet Take 1 tablet once a day when you are in the mountains 30 tablet 5  . Multiple Vitamin (MULTIVITAMIN WITH MINERALS) TABS tablet Take 1 tablet by mouth daily.     No current facility-administered medications on file prior to visit.    Past Medical History:  Diagnosis Date  . Allergy    dust, cats  . Anxiety   . Asthma    controlled  . Hyperlipidemia   . Hypertension   . Left breast mass   . Migraines     Past Surgical History:  Procedure Laterality Date  . BREAST EXCISIONAL BIOPSY Right 06/2019   benign  . BREAST LUMPECTOMY WITH RADIOACTIVE SEED LOCALIZATION Left 07/08/2019   Procedure: LEFT BREAST LUMPECTOMY WITH RADIOACTIVE SEED LOCALIZATION;  Surgeon: Jovita Kussmaul, MD;  Location: [facility];  Service: General;  Laterality: Left;  . COLONOSCOPY  2009  neg; [facility]  . thyroid cyst aspirated    . TONSILLECTOMY      Social History   Socioeconomic History  . Marital status: Married    Spouse name: Not on file  . Number of children: 0  . Years of education: 6  . Highest education level: Not on file  Occupational History  . Occupation: retired  Tobacco Use  . Smoking status: Never Smoker  . Smokeless tobacco: Never Used  Vaping Use  . Vaping Use: Never used  Substance and Sexual Activity  . Alcohol use: Yes    Alcohol/week: 7.0 standard drinks    Types: 7 Glasses of wine per week    Comment: wine   . Drug use: No  . Sexual activity: Not on file  Other Topics Concern  . Not on file  Social  History Narrative   Exercise: regular      Right handed      One story home   Social Determinants of Health   Financial Resource Strain:   . Difficulty of Paying Living Expenses: Not on file  Food Insecurity:   . Worried About Charity fundraiser in the Last Year: Not on file  . Ran Out of Food in the Last Year: Not on file  Transportation Needs:   . Lack of Transportation (Medical): Not on file  . Lack of Transportation (Non-Medical): Not on file  Physical Activity:   . Days of Exercise per Week: Not on file  . Minutes of Exercise per Session: Not on file  Stress:   . Feeling of Stress : Not on file  Social Connections:   . Frequency of Communication with Friends and Family: Not on file  . Frequency of Social Gatherings with Friends and Family: Not on file  . Attends Religious Services: Not on file  . Active Member of Clubs or Organizations: Not on file  . Attends Archivist Meetings: Not on file  . Marital Status: Not on file    Family History  Problem Relation Age of Onset  . Heart disease Mother        CABG 3 vessel @ 80  . Hypertension Mother   . Hyperlipidemia Mother   . Diabetes Mother   . Stroke Mother 22  . Prostate cancer Father   . Hypertension Father   . Breast cancer Sister   . Hyperlipidemia Sister   . Allergic rhinitis Neg Hx   . Asthma Neg Hx   . Angioedema Neg Hx   . Eczema Neg Hx   . Immunodeficiency Neg Hx   . Urticaria Neg Hx     Review of Systems  Constitutional: Negative for fever.  Respiratory: Positive for cough (allergies). Negative for shortness of breath and wheezing.   Cardiovascular: Positive for chest pain (tightness) and palpitations (occ). Negative for leg swelling.  Neurological: Positive for headaches (with elevated BP). Negative for dizziness and light-headedness.       Objective:   Vitals:   07/29/20 1056  BP: (!) 152/88  Pulse: 72  Temp: 98.4 F (36.9 C)  SpO2: 99%   BP Readings from Last 3 Encounters:   07/29/20 (!) 152/88  10/27/19 (!) 142/72  09/10/19 (!) 148/88   Wt Readings from Last 3 Encounters:  10/27/19 152 lb 3.2 oz (69 kg)  09/10/19 151 lb (68.5 kg)  08/31/19 152 lb (68.9 kg)   Body mass index is 28.48 kg/m.   Physical Exam    Constitutional: Appears well-developed and  well-nourished. No distress.  HENT:  Head: Normocephalic and atraumatic.  Neck: Neck supple. No tracheal deviation present. No thyromegaly present.  No cervical lymphadenopathy Cardiovascular: Normal rate, regular rhythm and normal heart sounds.   No murmur heard. No carotid bruit .  No edema Pulmonary/Chest: Effort normal and breath sounds normal. No respiratory distress. No has no wheezes. No rales.  Skin: Skin is warm and dry. Not diaphoretic.  Psychiatric: Normal mood and affect. Behavior is normal.      Assessment & Plan:    See Problem List for Assessment and Plan of chronic medical problems.    This visit occurred during the SARS-CoV-2 public health emergency.  Safety protocols were in place, including screening questions prior to the visit, additional usage of staff PPE, and extensive cleaning of exam room while observing appropriate contact time as indicated for disinfecting solutions.

## 2020-07-28 NOTE — Patient Instructions (Addendum)
  Blood work was ordered.  Have it done in a week or two.    Medications reviewed and updated.  Changes include :   Change losartan to losartan-hctz 100-25 mg daily.    Start trazodone at night.   Your prescription(s) have been submitted to your pharmacy. Please take as directed and contact our office if you believe you are having problem(s) with the medication(s).     Please followup in 6 months for a physical exam, sooner if needed

## 2020-07-29 ENCOUNTER — Encounter: Payer: Self-pay | Admitting: Internal Medicine

## 2020-07-29 ENCOUNTER — Other Ambulatory Visit: Payer: Self-pay

## 2020-07-29 ENCOUNTER — Ambulatory Visit: Payer: Medicare PPO | Admitting: Internal Medicine

## 2020-07-29 VITALS — BP 152/88 | HR 72 | Temp 98.4°F | Ht 61.3 in

## 2020-07-29 DIAGNOSIS — Z23 Encounter for immunization: Secondary | ICD-10-CM

## 2020-07-29 DIAGNOSIS — R7303 Prediabetes: Secondary | ICD-10-CM | POA: Diagnosis not present

## 2020-07-29 DIAGNOSIS — I10 Essential (primary) hypertension: Secondary | ICD-10-CM

## 2020-07-29 DIAGNOSIS — G479 Sleep disorder, unspecified: Secondary | ICD-10-CM

## 2020-07-29 DIAGNOSIS — E7849 Other hyperlipidemia: Secondary | ICD-10-CM | POA: Diagnosis not present

## 2020-07-29 MED ORDER — TRAZODONE HCL 50 MG PO TABS: 25.0000 mg | ORAL_TABLET | Freq: Every evening | ORAL | 5 refills | Status: DC | PRN

## 2020-07-29 MED ORDER — LOSARTAN POTASSIUM-HCTZ 100-25 MG PO TABS: 1.0000 | ORAL_TABLET | Freq: Every day | ORAL | 3 refills | Status: DC

## 2020-07-29 NOTE — Assessment & Plan Note (Signed)
New problem She typically falls asleep okay, but wakes up in the middle the night and has difficulty going back to sleep Did not tolerate melatonin Was taking gabapentin for her feet, but it did not help her sleep Trial of trazodone 25-50 mg at bedtime-discussed that we can increase this further if needed Discussed trial of CBD

## 2020-07-29 NOTE — Assessment & Plan Note (Signed)
Chronic Check a1c Low sugar / carb diet Stressed regular exercise  

## 2020-07-29 NOTE — Assessment & Plan Note (Signed)
Chronic Diet controlled Check lipid panel with next blood work Encourage regular exercise, healthy diet and weight loss

## 2020-07-29 NOTE — Assessment & Plan Note (Signed)
Chronic Not ideally controlled Was taking 50 mg of losartan and increase that herself a few days ago to 100 mg daily I still do not think that is getting enough for her blood pressure Start losartan-hydrochlorothiazide 100-25 mg daily CMP in 1-2 weeks Monitor blood pressure at home-discussed goal Follow-up in 6 months, sooner if needed

## 2020-08-14 NOTE — Progress Notes (Signed)
Subjective:    Patient ID: Samantha Shepard, female    DOB: [birth year], 64 y.o.   MRN: [id number]  HPI The patient is here for an acute visit.  tendinitis R arm -  She had it for about 20 years.  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  She had an injection and did PT in the past, which helped.  She wears a brace as needed.  She feels the iPad is what is causing her symptoms now.  She also thinks anything that requires her holding her arms up makes it worse such as driving or ironing.  She has it in both arms.  The pain is medial anterior forearm and raidates up the distal upper arm. Her left arm is in the same location.  She has not had an injection in years. She rubs it, iced it, elevates it.    Last month we started trazodone.  It makes her groggy the next day and stopped taking it.  We also added hctz 25 mg daily for her blood pressure.    Medications and allergies reviewed with patient and updated if appropriate.  Patient Active Problem List   Diagnosis Date Noted  . Sleep difficulties 07/29/2020  . Mild intermittent asthma without complication 44/31/5400  . History of epistaxis 07/14/2019  . Temporomandibular joint (TMJ) pain 06/16/2018  . Anxiety 06/16/2018  . Cervical adenopathy 06/16/2018  . 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 Acute sinus infection 10/04/2017  . Prediabetes 08/01/2017  . Tingling of both feet 04/18/2016  . Osteopenia 01/15/2016  . Mild persistent asthma 01/10/2016  . Other allergic rhinitis 01/10/2016  . Hyperlipidemia 08/13/2008  . Migraine headache 09/22/2007  . Uncontrolled hypertension 09/22/2007  . Allergic rhinitis 05/26/2007    Current Outpatient Medications on File Prior to Visit  Medication Sig Dispense Refill  . albuterol (VENTOLIN HFA) 108 (90 Base) MCG/ACT inhaler INHALE 2 PUFFS INTO THE LUNGS EVERY 4 (FOUR) HOURS AS NEEDED FOR WHEEZING OR SHORTNESS OF BREATH. 18 g 1  . cholecalciferol (VITAMIN  D3) 25 MCG (1000 UT) tablet Take 1,000 Units by mouth daily.    . clonazePAM (KLONOPIN) 0.5 MG tablet clonazepam 0.5 mg tablet  TAKE 1 TABLET BY MOUTH AT BEDTIME AS NEEDED FOR ANXIETY OR TMJ    . estradiol-norethindrone (ACTIVELLA) 1-0.5 MG tablet Take 1 mg by mouth every other day.    . gabapentin (NEURONTIN) 100 MG capsule Take 1-3 capsules (100-300 mg total) by mouth at bedtime. 90 capsule 3  . losartan-hydrochlorothiazide (HYZAAR) 100-25 MG tablet Take 1 tablet by mouth daily. 90 tablet 3  . magnesium 30 MG tablet Take 30 mg by mouth 2 (two) times daily.    . mometasone (ASMANEX, 120 METERED DOSES,) 220 MCG/INH inhaler Two puffs once a day to prevent cough or wheeze. 1 Inhaler 5  . montelukast (SINGULAIR) 10 MG tablet Take 1 tablet once a day when you are in the mountains 30 tablet 5  . Multiple Vitamin (MULTIVITAMIN WITH MINERALS) TABS tablet Take 1 tablet by mouth daily.     No current facility-administered medications on file prior to visit.    Past Medical History:  Diagnosis Date  . Allergy    dust, cats  . Anxiety   . Asthma    controlled  . Hyperlipidemia   . Hypertension   . Left breast mass   . Migraines     Past Surgical History:  Procedure Laterality  Date  . BREAST EXCISIONAL BIOPSY Right 06/2019   benign  . BREAST LUMPECTOMY WITH RADIOACTIVE SEED LOCALIZATION Left 07/08/2019   Procedure: LEFT BREAST LUMPECTOMY WITH RADIOACTIVE SEED LOCALIZATION;  Surgeon: Jovita Kussmaul, MD;  Location: [place];  Service: General;  Laterality: Left;  . COLONOSCOPY  2009   neg; [facility]  . thyroid cyst aspirated    . TONSILLECTOMY      Social History   Socioeconomic History  . Marital status: Married    Spouse name: Not on file  . Number of children: 0  . Years of education: 74  . Highest education level: Not on file  Occupational History  . Occupation: retired  Tobacco Use  . Smoking status: Never Smoker  . Smokeless tobacco: Never Used  Vaping Use   . Vaping Use: Never used  Substance and Sexual Activity  . Alcohol use: Yes    Alcohol/week: 7.0 standard drinks    Types: 7 Glasses of wine per week    Comment: wine   . Drug use: No  . Sexual activity: Not on file  Other Topics Concern  . Not on file  Social History Narrative   Exercise: regular      Right handed      One story home   Social Determinants of Health   Financial Resource Strain:   . Difficulty of Paying Living Expenses: Not on file  Food Insecurity:   . Worried About Charity fundraiser in the Last Year: Not on file  . Ran Out of Food in the Last Year: Not on file  Transportation Needs:   . Lack of Transportation (Medical): Not on file  . Lack of Transportation (Non-Medical): Not on file  Physical Activity:   . Days of Exercise per Week: Not on file  . Minutes of Exercise per Session: Not on file  Stress:   . Feeling of Stress : Not on file  Social Connections:   . Frequency of Communication with Friends and Family: Not on file  . Frequency of Social Gatherings with Friends and Family: Not on file  . Attends Religious Services: Not on file  . Active Member of Clubs or Organizations: Not on file  . Attends Archivist Meetings: Not on file  . Marital Status: Not on file    Family History  Problem Relation Age of Onset  . Heart disease Mother        CABG 3 vessel @ 49  . Hypertension Mother   . Hyperlipidemia Mother   . Diabetes Mother   . Stroke Mother 52  . Prostate cancer Father   . Hypertension Father   . Breast cancer Sister   . Hyperlipidemia Sister   . Allergic rhinitis Neg Hx   . Asthma Neg Hx   . Angioedema Neg Hx   . Eczema Neg Hx   . Immunodeficiency Neg Hx   . Urticaria Neg Hx     Review of Systems  Constitutional: Negative for fever.  Respiratory: Negative for shortness of breath.   Cardiovascular: Negative for chest pain, palpitations and leg swelling.  Neurological: Positive for weakness (Right hand). Negative  for light-headedness, numbness and headaches.       Objective:   Vitals:   08/15/20 0916  BP: (!) 158/80  Pulse: 86  Temp: 98.6 F (37 C)  SpO2: 99%   BP Readings from Last 3 Encounters:  08/15/20 (!) 158/80  07/29/20 (!) 152/88  10/27/19 (!) 142/72  Wt Readings from Last 3 Encounters:  10/27/19 152 lb 3.2 oz (69 kg)  09/10/19 151 lb (68.5 kg)  08/31/19 152 lb (68.9 kg)   Body mass index is 28.48 kg/m.   Physical Exam Constitutional:      General: She is not in acute distress.    Appearance: Normal appearance. She is not ill-appearing.  HENT:     Head: Normocephalic and atraumatic.  Cardiovascular:     Rate and Rhythm: Normal rate and regular rhythm.     Heart sounds: No murmur heard.   Pulmonary:     Effort: Pulmonary effort is normal. No respiratory distress.     Breath sounds: No wheezing or rales.  Musculoskeletal:     Right lower leg: No edema.     Left lower leg: No edema.     Comments: No tenderness with palpation bilateral forearms or upper arms.  No deformities or swelling.  No skin changes.  No tenderness posterior elbow/epicondyles.  Full range of motion bilateral upper extremities  Skin:    General: Skin is warm and dry.  Neurological:     Mental Status: She is alert.     Sensory: No sensory deficit.     Motor: No weakness.            Assessment & Plan:    See Problem List for Assessment and Plan of chronic medical problems.    This visit occurred during the SARS-CoV-2 public health emergency.  Safety protocols were in place, including screening questions prior to the visit, additional usage of staff PPE, and extensive cleaning of exam room while observing appropriate contact time as indicated for disinfecting solutions.

## 2020-08-14 NOTE — Patient Instructions (Addendum)
   Medications reviewed and updated.  Changes include :   Start amlodipine 5 mg daily for your BP.   Start meloxicam 15 mg daily with food for your tendinitis.   Your prescription(s) have been submitted to your pharmacy. Please take as directed and contact our office if you believe you are having problem(s) with the medication(s).

## 2020-08-15 ENCOUNTER — Ambulatory Visit (INDEPENDENT_AMBULATORY_CARE_PROVIDER_SITE_OTHER): Payer: Medicare PPO | Admitting: Internal Medicine

## 2020-08-15 ENCOUNTER — Encounter: Payer: Self-pay | Admitting: Internal Medicine

## 2020-08-15 ENCOUNTER — Other Ambulatory Visit: Payer: Self-pay

## 2020-08-15 ENCOUNTER — Other Ambulatory Visit (INDEPENDENT_AMBULATORY_CARE_PROVIDER_SITE_OTHER): Payer: Medicare PPO

## 2020-08-15 VITALS — BP 158/80 | HR 86 | Temp 98.6°F | Ht 61.3 in

## 2020-08-15 DIAGNOSIS — G479 Sleep disorder, unspecified: Secondary | ICD-10-CM

## 2020-08-15 DIAGNOSIS — I10 Essential (primary) hypertension: Secondary | ICD-10-CM | POA: Diagnosis not present

## 2020-08-15 DIAGNOSIS — M779 Enthesopathy, unspecified: Secondary | ICD-10-CM | POA: Diagnosis not present

## 2020-08-15 DIAGNOSIS — E7849 Other hyperlipidemia: Secondary | ICD-10-CM

## 2020-08-15 DIAGNOSIS — R7303 Prediabetes: Secondary | ICD-10-CM

## 2020-08-15 LAB — LIPID PANEL
Cholesterol: 219 mg/dL — ABNORMAL HIGH (ref 0–200)
HDL: 46 mg/dL (ref >39.00)
NonHDL: 172.53
Total CHOL/HDL Ratio: 5
Triglycerides: 259 mg/dL — ABNORMAL HIGH (ref 0.0–149.0)
VLDL: 51.8 mg/dL — ABNORMAL HIGH (ref 0.0–40.0)

## 2020-08-15 LAB — COMPREHENSIVE METABOLIC PANEL
ALT: 25 U/L (ref 0–35)
AST: 20 U/L (ref 0–37)
Albumin: 4.2 g/dL (ref 3.5–5.2)
Alkaline Phosphatase: 50 U/L (ref 39–117)
BUN: 18 mg/dL (ref 6–23)
CO2: 25 mEq/L (ref 19–32)
Calcium: 9.9 mg/dL (ref 8.4–10.5)
Chloride: 100 mEq/L (ref 96–112)
Creatinine, Ser: 0.8 mg/dL (ref 0.40–1.20)
GFR: 76.93 mL/min (ref >60.00)
Glucose, Bld: 95 mg/dL (ref 70–99)
Potassium: 3.9 mEq/L (ref 3.5–5.1)
Sodium: 136 mEq/L (ref 135–145)
Total Bilirubin: 0.6 mg/dL (ref 0.2–1.2)
Total Protein: 7 g/dL (ref 6.0–8.3)

## 2020-08-15 LAB — LDL CHOLESTEROL, DIRECT: Direct LDL: 148 mg/dL

## 2020-08-15 LAB — HEMOGLOBIN A1C: Hgb A1c MFr Bld: 6 % (ref 4.6–6.5)

## 2020-08-15 MED ORDER — MELOXICAM 15 MG PO TABS: 15.0000 mg | ORAL_TABLET | Freq: Every day | ORAL | 0 refills | Status: DC

## 2020-08-15 MED ORDER — AMLODIPINE BESYLATE 5 MG PO TABS: 5.0000 mg | ORAL_TABLET | Freq: Every day | ORAL | 1 refills | Status: DC

## 2020-08-15 NOTE — Assessment & Plan Note (Signed)
Chronic Trazodone was not effective and she did stop taking this She does use her clonazepam on occasion and will only take half a pill, but does not want to do this on a regular basis because of potential addiction

## 2020-08-15 NOTE — Assessment & Plan Note (Signed)
Chronic Not ideally controlled Continue losartan-hydrochlorothiazide 100-25 mg daily Add amlodipine 5 mg daily She did have blood work done today to check kidney function

## 2020-08-15 NOTE — Assessment & Plan Note (Signed)
New problem Acute on chronic problem-she has had this intermittently over the past 20 years Has had injections, physical therapy in the past.  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

## 2020-08-16 ENCOUNTER — Other Ambulatory Visit: Payer: Self-pay | Admitting: Pediatrics

## 2020-08-16 DIAGNOSIS — H43393 Other vitreous opacities, bilateral: Secondary | ICD-10-CM | POA: Diagnosis not present

## 2020-08-16 DIAGNOSIS — H11153 Pinguecula, bilateral: Secondary | ICD-10-CM | POA: Diagnosis not present

## 2020-08-16 DIAGNOSIS — H2513 Age-related nuclear cataract, bilateral: Secondary | ICD-10-CM | POA: Diagnosis not present

## 2020-08-17 ENCOUNTER — Telehealth: Payer: Self-pay | Admitting: Internal Medicine

## 2020-08-17 DIAGNOSIS — M779 Enthesopathy, unspecified: Secondary | ICD-10-CM

## 2020-08-17 NOTE — Telephone Encounter (Signed)
Patient states she was recently seen for tendonitis and she talked to Dr. Quay Burow about getting a cortisone shot but denied it at that time and now she wants to get one. Patient was wondering if that was something we could do in the office or if she would need to be referred somewhere. Patient # [phone]

## 2020-08-17 NOTE — Telephone Encounter (Signed)
Referral ordered for sports med

## 2020-08-18 NOTE — Telephone Encounter (Signed)
Left message for patient today. 

## 2020-08-26 NOTE — Progress Notes (Signed)
Subjective:    I'm seeing this patient as a consultation for Dr. Billey Gosling. Note will be routed back to referring provider/PCP.  CC: Elbow pain  I, Wendy Poet, LAT, ATC, am serving as scribe for Dr. [name].  HPI: Pt is a 64yo female that c/o B medial elbow pain, R >L. Pt reports pn has been going on intermittently over the past 20 yrs. Pt has received prior injections, PT, and wears a brace. Pt was last seen by PCP on 08/15/20 and was advised to revise activities (iPad, ironing, driving) and start Meloxicam 15 mg. Since then, pt states that her B medial elbow pain returned about 2 months ago and she started playing more games on her iPad.  She locates her pain to her B medial elbows that radiates both down into her forearms and up into her upper arms.  Radiating pain: yes from medial forearm to upper arm Swelling: at her R lateral epicondyle Aggravating factors: using iPad; lifting w/ her hands; holding her arm up Treatments tried: counterforce strap; Meloxicam  Past medical history, Surgical history, Family history, Social history, Allergies, and medications have been entered into the medical record, reviewed.   Review of Systems: No new headache, visual changes, nausea, vomiting, diarrhea, constipation, dizziness, abdominal pain, skin rash, fevers, chills, night sweats, weight loss, swollen lymph nodes, body aches, joint swelling, muscle aches, chest pain, shortness of breath, mood changes, visual or auditory hallucinations.   Objective:    Vitals:   08/29/20 1257  BP: 120/66  Pulse: 84  SpO2: 96%   General: Well Developed, well nourished, and in no acute distress.  Neuro/Psych: Alert and oriented x3, extra-ocular muscles intact, able to move all 4 extremities, sensation grossly intact. Skin: Warm and dry, no rashes noted.  Respiratory: Not using accessory muscles, speaking in full sentences, trachea midline.  Cardiovascular: Pulses palpable, no extremity  edema. Abdomen: Does not appear distended. MSK: Right elbow normal-appearing Tender palpation medial epicondyle. Normal elbow motion and strength.  Mild to moderate pain with resisted wrist and hand flexion.  Minimal pain to resisted pronation. Pulses cap refill and sensation are intact distally.  Left elbow normal-appearing Mildly tender palpation medial epicondyle. Normal elbow motion and strength. Mild pain with resisted wrist and hand flexion and pronation. Pulses capillary refill and sensation are intact distally.  Lab and Radiology Results  X-ray images bilateral elbows obtained today personally and independently interpreted  Right elbow: Osteophyte present at lateral epicondyle.  No severe degenerative changes or malformation.  Left elbow: No severe degenerative changes.  No fracture or malformation.  Await formal radiology review  Diagnostic Limited MSK Ultrasound of: Right elbow Medial epicondyles visualized.  Normal appearance of common flexor tendon insertion onto medial epicondyle.  Increased elbow activity insertion site indicating tendinopathy.  No visible tear present. Lateral epicondyle visualized hyperechoic change in most superficial portion of lateral epicondyle consistent with osteophyte.  No tear or increased vascular activity. Impression: Medial epicondylitis   Impression and Recommendations:    Assessment and Plan: 64 y.o. female with bilateral medial elbow pain consistent with medial epicondylitis.  This is been an ongoing problem for years worsening recently with increase in activity at home including iPad use and ironing.  She is tried some conservative management recently including counterforce strap and NSAIDs with mild improvement.  She already has been referred back to physical therapy and is scheduled to start next week.  Agree with this.  Reviewed home exercise program again with patient.  We will add that to the existing PT order.  We will also add  nitroglycerin patch protocol.  Recommend wrist brace with heavy duty activity.  Check back in a month.Marland Kitchen  PDMP not reviewed this encounter. Orders Placed This Encounter  Procedures  . [place] LIMITED JOINT SPACE STRUCTURES UP BILAT(NO LINKED CHARGES)    Order Specific Question:   Reason for Exam (SYMPTOM  OR DIAGNOSIS REQUIRED)    Answer:   B medial elbow pain    Order Specific Question:   Preferred imaging location?    Answer:   [facility]  . DG ELBOW COMPLETE RIGHT (3+VIEW)    Standing Status:   Future    Standing Expiration Date:   08/29/2021    Order Specific Question:   Reason for Exam (SYMPTOM  OR DIAGNOSIS REQUIRED)    Answer:   eval elbow pain    Order Specific Question:   Preferred imaging location?    Answer:   [facility]  . DG ELBOW COMPLETE LEFT (3+VIEW)    Standing Status:   Future    Standing Expiration Date:   08/29/2021    Order Specific Question:   Reason for Exam (SYMPTOM  OR DIAGNOSIS REQUIRED)    Answer:   eval elbow pain    Order Specific Question:   Preferred imaging location?    Answer:   [facility]   Meds ordered this encounter  Medications  . nitroGLYCERIN (NITRODUR - DOSED IN MG/24 HR) 0.2 mg/hr patch    Sig: Apply 1/4 patch daily to tendon for tendonitis.    Dispense:  30 patch    Refill:  1    Discussed warning signs or symptoms. Please see discharge instructions. Patient expresses understanding.   The above documentation has been reviewed and is accurate and complete [name], M.D.

## 2020-08-29 ENCOUNTER — Encounter: Payer: Self-pay | Admitting: Family Medicine

## 2020-08-29 ENCOUNTER — Ambulatory Visit: Payer: Self-pay

## 2020-08-29 ENCOUNTER — Ambulatory Visit (INDEPENDENT_AMBULATORY_CARE_PROVIDER_SITE_OTHER): Payer: Medicare PPO

## 2020-08-29 ENCOUNTER — Other Ambulatory Visit: Payer: Self-pay

## 2020-08-29 ENCOUNTER — Ambulatory Visit: Payer: Medicare PPO | Admitting: Family Medicine

## 2020-08-29 VITALS — BP 120/66 | HR 84 | Ht 61.0 in | Wt 156.6 lb

## 2020-08-29 DIAGNOSIS — M25522 Pain in left elbow: Secondary | ICD-10-CM

## 2020-08-29 DIAGNOSIS — M7700 Medial epicondylitis, unspecified elbow: Secondary | ICD-10-CM

## 2020-08-29 DIAGNOSIS — M25521 Pain in right elbow: Secondary | ICD-10-CM | POA: Diagnosis not present

## 2020-08-29 MED ORDER — NITROGLYCERIN 0.2 MG/HR TD PT24: MEDICATED_PATCH | TRANSDERMAL | 1 refills | Status: DC

## 2020-08-29 NOTE — Patient Instructions (Addendum)
Thank you for coming in today.  Do the PT and home exercises.  Stretch and strength.  Do the strength slowly.  Try theraband flexbar. (red)  Try a wrist brace with heavy duty activity.   Recheck in 1 month.  Return sooner or contact me sooner if needed.   Please get an Xray today before you leave  Nitroglycerin Protocol   Apply 1/4 nitroglycerin patch to affected area daily.  Change position of patch within the affected area every 24 hours.  You may experience a headache during the first 1-2 weeks of using the patch, these should subside.  If you experience headaches after beginning nitroglycerin patch treatment, you may take your preferred over the counter pain reliever.  Another side effect of the nitroglycerin patch is skin irritation or rash related to patch adhesive.  Please notify our office if you develop more severe headaches or rash, and stop the patch.  Tendon healing with nitroglycerin patch may require 12 to 24 weeks depending on the extent of injury.  Men should not use if taking Viagra, Cialis, or Levitra.   Do not use if you have migraines or rosacea.    Golfer's Elbow  Golfer's elbow, also called medial epicondylitis, is a condition that results from inflammation of the strong bands of tissue (tendons) that attach your forearm muscles to the inside of your bone at the elbow. These tendons affect the muscles that bend the palm toward the wrist (flexion). The tendons become less flexible with age. This condition is called golfer's elbow because it is more common among people who constantly bend and twist their wrists, such as golfers. This injury is usually caused by overuse. What are the causes? This condition is caused by:  Repeatedly flexing, turning, or twisting your wrist.  Constantly gripping objects with your hands. What increases the risk? This condition is more likely to develop in people who play golf, baseball, or tennis. This injury is more  common among people who have jobs that require the constant use of their hands, such as:  Carpenters.  Butchers.  Musicians.  Typists. What are the signs or symptoms? This condition causes elbow pain that may spread to your forearm and upper arm. Symptoms of this condition include.  Pain at the inner elbow, forearm, or wrist.  Reduced grip strength. The pain may get worse when you bend your wrist downward. How is this diagnosed? This condition is diagnosed based on your symptoms, medical history, and a physical exam. During the exam, your health care provider may:  Test your grip strength.  Move your wrist to check for pain. You may also have an MRI to:  Confirm the diagnosis.  Look for other issues.  Check for tears in the ligaments, muscles, or tendons. How is this treated? Treatment for this condition includes:  Stopping all activities that make you bend or twist your elbow or wrist and waiting until your pain and other symptoms go away before resuming those activities.  Wearing an elbow brace or wrist splint to restrict the movements that cause symptoms.  Icing your inner elbow, forearm, or wrist to relieve pain.  Taking NSAIDs or getting corticosteroid injections to reduce pain and swelling.  Doing stretching, range-of-motion, and strengthening exercises (physical therapy) as told by your health care provider. In rare cases, surgery may be needed if your condition does not improve. Follow these instructions at home: If you have a brace or splint:  Wear it as told by your health care provider.  Loosen it if your fingers tingle, become numb, or turn cold and blue.  Keep it clean. Managing pain, stiffness, and swelling   If directed, put ice on the injured area. ? Put ice in a plastic bag. ? Place a towel between your skin and the bag. ? Leave the ice on for 20 minutes, 2-3 times a day.  Move your fingers often to avoid stiffness.  Raise (elevate) the  injured area above the level of your heart while you are sitting or lying down. Activity  Rest your injured area as told by your health care provider.  Return to your normal activities as told by your health care provider. Ask your health care provider what activities are safe for you.  Do exercises as told by your health care provider. Lifestyle  If your condition is caused by sports, work with a trainer to make sure that you: ? Have the correct technique. ? Are using the proper equipment.  If your condition is work related, talk with your employer about changes that can be made. General instructions  Take over-the-counter and prescription medicines only as told by your health care provider.  Do not use any products that contain nicotine or tobacco, such as cigarettes, e-cigarettes, and chewing tobacco. If you need help quitting, ask your health care provider.  Keep all follow-up visits as told by your health care provider. This is important. How is this prevented?  Before and after activity: ? Warm up and stretch before being active. ? Cool down and stretch after being active. ? Give your body time to rest between periods of activity.  During activity: ? Make sure to use equipment that fits you. ? If you play golf, slow your golf swing to reduce shock in the arm when making contact with the ball.  Maintain physical fitness, including: ? Strength. ? Flexibility. ? Cardiovascular fitness. ? Endurance.  Do exercises to strengthen the forearm muscles. Contact a health care provider if:  Your pain does not improve or it gets worse.  You notice numbness in your hand. Get help right away if:  Your pain is severe.  You cannot move your wrist. Summary  Golfer's elbow, also called medial epicondylitis, is a condition that results from inflammation of the strong bands of tissue (tendons) that attach your forearm muscles to the inside of your bone at the elbow.  This  injury usually results from overuse.  Symptoms of this condition include decreased grip strength and pain at the inner elbow, forearm, or wrist.  This injury is treated with rest, ice, medicines, physical therapy, and surgery as needed. This information is not intended to replace advice given to you by your health care provider. Make sure you discuss any questions you have with your health care provider. Document Revised: 01/15/2019 Document Reviewed: 07/31/2018 Elsevier Patient Education  2020 [employer].

## 2020-08-30 NOTE — Progress Notes (Signed)
X-ray right elbow normal to radiology.

## 2020-08-30 NOTE — Progress Notes (Signed)
X-ray left elbow normal to radiology.

## 2020-08-31 ENCOUNTER — Other Ambulatory Visit: Payer: Self-pay

## 2020-08-31 ENCOUNTER — Encounter: Payer: Self-pay | Admitting: Family Medicine

## 2020-08-31 ENCOUNTER — Ambulatory Visit: Payer: Medicare PPO | Admitting: Family Medicine

## 2020-08-31 VITALS — BP 126/70 | HR 92 | Temp 98.4°F | Resp 16 | Ht 61.0 in | Wt 155.0 lb

## 2020-08-31 DIAGNOSIS — K219 Gastro-esophageal reflux disease without esophagitis: Secondary | ICD-10-CM | POA: Insufficient documentation

## 2020-08-31 DIAGNOSIS — J3089 Other allergic rhinitis: Secondary | ICD-10-CM | POA: Diagnosis not present

## 2020-08-31 DIAGNOSIS — J4541 Moderate persistent asthma with (acute) exacerbation: Secondary | ICD-10-CM | POA: Diagnosis not present

## 2020-08-31 DIAGNOSIS — R059 Cough, unspecified: Secondary | ICD-10-CM | POA: Insufficient documentation

## 2020-08-31 MED ORDER — ALVESCO 80 MCG/ACT IN AERS: INHALATION_SPRAY | RESPIRATORY_TRACT | 1 refills | Status: DC

## 2020-08-31 MED ORDER — MONTELUKAST SODIUM 10 MG PO TABS
ORAL_TABLET | ORAL | 1 refills | Status: DC
Start: 2020-08-31 — End: 2021-02-14

## 2020-08-31 NOTE — Progress Notes (Addendum)
[address] [place] [address] Dept: [phone]  FOLLOW UP NOTE  Patient ID: Samantha Shepard [facility], female    DOB: 04-16-1956  Age: 64 y.o. MRN: [id number] Date of Office Visit: 08/31/2020  Assessment  Chief Complaint: Asthma and Cough  HPI Samantha Shepard is a 64 year old female who presents to the clinic for evaluation of asthma exacerbation.  She was last seen in this clinic on 10/27/2019 by Dr. Shaune Leeks for evaluation of asthma and allergic rhinitis.  In the interim, [place] a wood burning stove for heat as well as oil for heat.  At today's visit she reports her asthma has been poorly controlled with shortness of breath over the last 2 to 4 weeks and cough that is occasionally dry and occasionally produces clear mucus.  She denies wheeze with rest or activity.  She continues montelukast 10 mg once a day and last used her albuterol about 2 or 3 weeks ago with the weather change.  Allergic rhinitis is reported as poorly controlled with nasal congestion, clear rhinorrhea, sneezing, and postnasal drainage.  She occasionally uses saline nasal rinses and occasionally uses ipratropium.  She is not currently using an antihistamine.  Reflux is reported as well controlled while taking his omeprazole 20 mg once a day.  Her current medications are listed in the chart.   Drug Allergies:  Allergies  Allergen Reactions   Sulfonamide Derivatives     REACTION: rash   Codeine     REACTION: Excitability--can't sleep    Physical Exam: BP 126/70 (BP Location: Right Arm, Patient Position: Sitting, Cuff Size: Normal)    Pulse 92    Temp 98.4 F (36.9 C) (Tympanic)    Resp 16    Ht 5\' 1"  (1.549 m)    Wt 155 lb (70.3 kg)    SpO2 100%    BMI 29.29 kg/m    Physical Exam Vitals reviewed.  Constitutional:      Appearance: Normal appearance.  HENT:     Head: Normocephalic and atraumatic.     Right Ear: Tympanic membrane  normal.     Left Ear: Tympanic membrane normal.     Nose:     Comments: Bilateral nares slightly erythematous with clear nasal drainage noted.  Pharynx normal.  Ears normal.  Eyes normal.    Mouth/Throat:     Pharynx: Oropharynx is clear.  Eyes:     Conjunctiva/sclera: Conjunctivae normal.  Cardiovascular:     Rate and Rhythm: Normal rate and regular rhythm.     Heart sounds: Normal heart sounds. No murmur heard.   Pulmonary:     Effort: Pulmonary effort is normal.     Breath sounds: Normal breath sounds.     Comments: Lungs clear to auscultation Musculoskeletal:        General: Normal range of motion.     Cervical back: Normal range of motion and neck supple.  Skin:    General: Skin is warm and dry.  Neurological:     Mental Status: She is alert and oriented to person, place, and time.  Psychiatric:        Mood and Affect: Mood normal.        Behavior: Behavior normal.        Thought Content: Thought content normal.        Judgment: Judgment normal.     Diagnostics: FVC 1.97, FEV1 1.65.  Predicted FVC 2.77, predicted FEV1 2.11.  Spirometry indicates mild restriction.  Post bronchodilator FVC 2.38, FEV1 1.81.  Postbronchodilator spirometry indicates normal ventilatory function with 21% improvement in FVC and 10% improvement in FEV1.  Assessment and Plan: 1. Moderate persistent asthma with acute exacerbation   2. Other allergic rhinitis   3. Gastroesophageal reflux disease, unspecified whether esophagitis present   4. Cough     Meds ordered this encounter  Medications   montelukast (SINGULAIR) 10 MG tablet    Sig: Take 1 tablet once a day when you [place] the [place]    Dispense:  90 tablet    Refill:  1    Please dispense 90 day supply.   ciclesonide (ALVESCO) 80 MCG/ACT inhaler    Sig: 2 puffs twice daily with spacer to prevent coughing or wheezing.    Dispense:  3 each    Refill:  1    Patient Instructions  Asthma Begin Alvesco 80-2 puffs twice a day with  a spacer to prevent cough or wheeze Prednisone 10 mg tablets. Take 2 tablets once a day for 4 days, then take 1 tablet on the 5th day, then stop.  Continue montelukast 10 mg once a day to prevent cough or wheeze Continue albuterol 2 puffs once every 4 hours as needed You may use albuterol 2 puffs 5-15 minutes before activity to decrease cough or wheeze  Allergic rhinitis Continue ipratroprium nasal spray 2 sprays in each nostril twice a day as needed for a runny nose Consider saline nasal rinses as needed for nasal symptoms. Use this before any medicated nasal sprays for best result  Reflux Continue dietary and lifestyle modifications as listed in the chart Continue esomeprazole once a day to control reflux. Take up to 1 hour before meals for best result  Cough Begin treatment plans as listed above  Call the clinic if this treatment plan is not working well for you  Follow up in 2 months or sooner if needed.   Return in about 2 months (around 10/31/2020), or if symptoms worsen or fail to improve.    Thank you for the opportunity to care for this patient.  Please do not hesitate to contact me with questions.  Gareth Morgan, FNP [facility] [place]  ________________________________________________  I have provided oversight concerning [facility]'s evaluation and treatment of this patient's health issues addressed during today's encounter.  I agree with the assessment and therapeutic plan as outlined in the note.   Signed,   R Edgar Frisk, MD

## 2020-08-31 NOTE — Patient Instructions (Addendum)
Asthma Begin Alvesco 80-2 puffs twice a day with a spacer to prevent cough or wheeze Prednisone 10 mg tablets. Take 2 tablets once a day for 4 days, then take 1 tablet on the 5th day, then stop.  Continue montelukast 10 mg once a day to prevent cough or wheeze Continue albuterol 2 puffs once every 4 hours as needed You may use albuterol 2 puffs 5-15 minutes before activity to decrease cough or wheeze  Allergic rhinitis Continue ipratroprium nasal spray 2 sprays in each nostril twice a day as needed for a runny nose Consider saline nasal rinses as needed for nasal symptoms. Use this before any medicated nasal sprays for best result  Reflux Continue dietary and lifestyle modifications as listed in the chart Continue esomeprazole once a day to control reflux. Take up to 1 hour before meals for best result  Cough Begin treatment plans as listed above  Call the clinic if this treatment plan is not working well for you  Follow up in 2 months or sooner if needed.

## 2020-09-06 ENCOUNTER — Encounter: Payer: Self-pay | Admitting: Physical Therapy

## 2020-09-06 ENCOUNTER — Other Ambulatory Visit: Payer: Self-pay

## 2020-09-06 ENCOUNTER — Ambulatory Visit: Payer: Medicare PPO | Attending: Internal Medicine | Admitting: Physical Therapy

## 2020-09-06 DIAGNOSIS — M25522 Pain in left elbow: Secondary | ICD-10-CM | POA: Insufficient documentation

## 2020-09-06 DIAGNOSIS — M25632 Stiffness of left wrist, not elsewhere classified: Secondary | ICD-10-CM | POA: Insufficient documentation

## 2020-09-06 DIAGNOSIS — M25631 Stiffness of right wrist, not elsewhere classified: Secondary | ICD-10-CM | POA: Diagnosis not present

## 2020-09-06 DIAGNOSIS — M25521 Pain in right elbow: Secondary | ICD-10-CM | POA: Insufficient documentation

## 2020-09-06 NOTE — Addendum Note (Signed)
Addended by: Janene Harvey D on: 09/06/2020 10:39 AM   Modules accepted: Orders

## 2020-09-06 NOTE — Therapy (Signed)
[place] [address]  [place], [place], [address] Phone: [phone]   Fax:  [phone]  Physical Therapy Evaluation  Patient Details  Name: Samantha Shepard MRN: [id number] Date of Birth: [birth year] Referring Provider (PT): Billey Gosling, MD   Encounter Date: 09/06/2020   PT End of Session - 09/06/20 1023    Visit Number 1    Number of Visits 7    Date for PT Re-Evaluation 10/18/20    Authorization Type [employer]    PT Start Time 0933    PT Stop Time 1015    PT Time Calculation (min) 42 min    Activity Tolerance Patient tolerated treatment well    Behavior During Therapy [facility] for tasks assessed/performed           Past Medical History:  Diagnosis Date  . Allergy    dust, cats  . Anxiety   . Asthma    controlled  . Hyperlipidemia   . Hypertension   . Left breast mass   . Migraines     Past Surgical History:  Procedure Laterality Date  . BREAST EXCISIONAL BIOPSY Right 06/2019   benign  . BREAST LUMPECTOMY WITH RADIOACTIVE SEED LOCALIZATION Left 07/08/2019   Procedure: LEFT BREAST LUMPECTOMY WITH RADIOACTIVE SEED LOCALIZATION;  Surgeon: Jovita Kussmaul, MD;  Location: [place];  Service: General;  Laterality: Left;  . COLONOSCOPY  2009   neg; [facility]  . thyroid cyst aspirated    . TONSILLECTOMY      There were no vitals filed for this visit.    Subjective Assessment - 09/06/20 0934    Subjective Patient reporting B elbow and forearm pain for several years, with recent flare for the past 2 months, R>L. Pain is located over the medial elbows with radiation down the mid forearm. Notes burning and tingling in the same distribution. Worse with holding her iPad in her L hand while using the R to touch the screen, ironing, reaching overhead, carrying something heavy. Was given exercises from her MD which has made it a little better. Has previously had PT for this issue  with good benefit. Using an elbow brace; has tried wrist splint without much improvement.    Pertinent History migraines, HTN, HLD, asthma, anxiety, L breast lumpectomy    Limitations Lifting;Reading;House hold activities    Diagnostic tests 08/29/20 B elbow xray: negative    Patient Stated Goals get my arm to stop hurting    Currently in Pain? Yes    Pain Score 7     Pain Location Elbow    Pain Orientation Left;Right;Medial    Pain Descriptors / Indicators Burning    Pain Type Acute pain;Chronic pain    Pain Radiating Towards mid forearm              [facility] PT Assessment - 09/06/20 0938      Assessment   Medical Diagnosis Tendinitis    Referring Provider (PT) Billey Gosling, MD    Onset Date/Surgical Date 07/07/20    Hand Dominance Right    Next MD Visit about 5 months    Prior Therapy yes- for elbows      Precautions   Precautions None      Balance Screen   Has the patient fallen in the past 6 months No    Has the patient had a decrease in activity level because of a fear of falling?  No  Is the patient reluctant to leave their home because of a fear of falling?  No      Home Ecologist residence    Living Arrangements Spouse/significant other    Available Help at Discharge Family    Type of [place]      Prior Function   Level of [place] Retired    Leisure playing on [place], going to gym      Cognition   Overall Cognitive Status Within Functional Limits for tasks assessed      Sensation   Light Touch Appears Intact      Coordination   Gross Motor Movements are Fluid and Coordinated Yes      Posture/Postural Control   Posture/Postural Control Postural limitations    Postural Limitations Forward head    Posture Comments slight      ROM / Strength   AROM / PROM / Strength AROM;Strength      AROM   AROM Assessment Site Elbow;Forearm;Wrist    Right/Left Elbow Right;Left    Right Elbow Flexion 160    pain   Right Elbow Extension 10    Left Elbow Flexion 155    Left Elbow Extension 5    Right/Left Forearm Right;Left    Right Forearm Pronation 88 Degrees    Right Forearm Supination 88 Degrees    Left Forearm Pronation 87 Degrees    Left Forearm Supination 90 Degrees    Right/Left Wrist Left    Right Wrist Extension 55 Degrees   pain   Right Wrist Flexion 68 Degrees   pain   Right Wrist Radial Deviation 22 Degrees    Right Wrist Ulnar Deviation 30 Degrees    Left Wrist Extension 64 Degrees    Left Wrist Flexion 73 Degrees    Left Wrist Radial Deviation 24 Degrees    Left Wrist Ulnar Deviation 30 Degrees      Strength   Strength Assessment Site Elbow;Forearm;Wrist;Hand    Right/Left Elbow Right;Left    Right Elbow Flexion 4+/5    Right Elbow Extension 4+/5    Left Elbow Flexion 5/5    Left Elbow Extension 5/5    Right/Left Forearm Right;Left    Right Forearm Pronation 4+/5    Right Forearm Supination 4+/5   pain   Left Forearm Pronation 4+/5    Left Forearm Supination 4+/5    Right/Left Wrist Right;Left    Right Wrist Flexion 4/5    Right Wrist Extension 4/5    Left Wrist Flexion 4+/5    Left Wrist Extension 4/5    Right/Left hand Right;Left    Right Hand Grip (lbs) 17   22, 19, 10; pain   Right Hand 3 Point Pinch 14.3 lbs   15, 14, 14   Left Hand Grip (lbs) 20   25, 20, 15; mild pain   Left Hand 3 Point Pinch 16 lbs   16, 16, 16     Palpation   Palpation comment TTP over B medial and L lateral epicondyles as well as B wrist flexors and extensors; increased soft tissue restriction throughout, L>R                      Objective measurements completed on examination: See above findings.               PT Education - 09/06/20 1023    Education Details prognosis, POC, HEP; edu on proper use  of elbow brace and wrist splint    Person(s) Educated Patient    Methods Explanation;Demonstration;Tactile cues;Verbal cues;Handout    Comprehension  Verbalized understanding;Returned demonstration            PT Short Term Goals - 09/06/20 1029      PT SHORT TERM GOAL #1   Title Patient to be independent with initial HEP.    Time 3    Period Weeks    Status New    Target Date 09/27/20             PT Long Term Goals - 09/06/20 1030      PT LONG TERM GOAL #1   Title Patient to be independent with advanced HEP.    Time 6    Period Weeks    Status New    Target Date 10/18/20      PT LONG TERM GOAL #2   Title Patient to demonstrate B elbow and wrist strength >/=4+/5 and B grip strength atleast 25 lbs.    Time 6    Period Weeks    Status New    Target Date 10/18/20      PT LONG TERM GOAL #3   Title Patient to demonstrate B wrist and elbow AROM WFL and without pain limiting.    Time 6    Period Weeks    Status New    Target Date 10/18/20      PT LONG TERM GOAL #4   Title Patient to report tolerance for using her iPad for 1 hour without pain limiting.    Time 6    Period Weeks    Status New    Target Date 10/18/20      PT LONG TERM GOAL #5   Title Patient to report tolerance for cleaning activities around the house without pain limiting.    Time 6    Period Weeks    Status New    Target Date 10/18/20                  Plan - 09/06/20 1024    Clinical Impression Statement Patient is a 64 y/o F presenting to OPPT with c/o acute on chronic B medial elbow pain for the past 2 months. Pain is located over the medial elbows with radiation down the mid anterior forearm, R>L, with burning and tingling in the same distribution. Aggravating factors include holding her iPad, ironing, reaching overhead, and carrying heavy objects. Patient today presenting with slight forward head posture, limited and painful B elbow and wrist AROM, decreased R elbow and B wrist strength, decreased and painful grip strength, and TTP over B medial and L lateral epicondyles as well as B wrist flexors and extensors. Patient was educated  on gentle stretching and eccentric strengthening HEP as well as edu on use of wrist splint and counterforce brace- patient reported understanding. Would benefit from skilled PT services 1x/week for 6 weeks to address aforementioned impairments.    Personal Factors and Comorbidities Age;Comorbidity 3+;Fitness;Past/Current Experience;Time since onset of injury/illness/exacerbation    Comorbidities migraines, HTN, HLD, asthma, anxiety, L breast lumpectomy    Examination-Activity Limitations Carry;Dressing;Hygiene/Grooming;Lift;Reach Overhead;Self Feeding    Examination-Participation Restrictions Cleaning;Community Activity;Shop;Driving;Yard Work;Laundry;Meal Prep    Stability/Clinical Decision Making Stable/Uncomplicated    Clinical Decision Making Low    Rehab Potential Good    PT Frequency 1x / week    PT Duration 6 weeks    PT Treatment/Interventions ADLs/Self Care Home Management;Cryotherapy;Electrical Stimulation;Iontophoresis 4mg /ml Dexamethasone;Moist Heat;Therapeutic  exercise;Therapeutic activities;Functional mobility training;Ultrasound;Neuromuscular re-education;Patient/family education;Manual techniques;Vasopneumatic Device;Taping;Splinting;Energy conservation;Dry needling;Passive range of motion    PT Next Visit Plan elbow FOTO; progress B elbow and wrist ROM and strength; STM wrist flexors/extensors    Consulted and Agree with Plan of Care Patient           Patient will benefit from skilled therapeutic intervention in order to improve the following deficits and impairments:  Hypomobility, Increased edema, Decreased activity tolerance, Decreased strength, Increased fascial restricitons, Pain, Impaired UE functional use, Increased muscle spasms, Improper body mechanics, Decreased range of motion, Postural dysfunction, Impaired flexibility  Visit Diagnosis: Pain in right elbow  Pain in left elbow  Stiffness of right wrist, not elsewhere classified  Stiffness of left wrist, not  elsewhere classified     Problem List Patient Active Problem List   Diagnosis Date Noted  . Moderate persistent asthma with acute exacerbation 08/31/2020  . Gastroesophageal reflux disease 08/31/2020  . Cough 08/31/2020  . Tendinitis 08/15/2020  . Sleep difficulties 07/29/2020  . Mild intermittent asthma without complication 59/97/7414  . History of epistaxis 07/14/2019  . Temporomandibular joint (TMJ) pain 06/16/2018  . Anxiety 06/16/2018  . Cervical adenopathy 06/16/2018  . 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 Acute sinus infection 10/04/2017  . Prediabetes 08/01/2017  . Tingling of both feet 04/18/2016  . Osteopenia 01/15/2016  . Mild persistent asthma 01/10/2016  . Other allergic rhinitis 01/10/2016  . Hyperlipidemia 08/13/2008  . Migraine headache 09/22/2007  . Uncontrolled hypertension 09/22/2007  . Allergic rhinitis 05/26/2007     Janene Harvey, PT, DPT 09/06/20 10:34 AM   [facility] [address]  [place], [place], [address] Phone: [phone]   Fax:  [phone]  Name: Samantha Shepard MRN: [id number] Date of Birth: 08/23/1956

## 2020-09-22 ENCOUNTER — Other Ambulatory Visit: Payer: Self-pay

## 2020-09-22 ENCOUNTER — Ambulatory Visit: Payer: Medicare PPO | Attending: Internal Medicine

## 2020-09-22 DIAGNOSIS — M25522 Pain in left elbow: Secondary | ICD-10-CM | POA: Diagnosis not present

## 2020-09-22 DIAGNOSIS — M25632 Stiffness of left wrist, not elsewhere classified: Secondary | ICD-10-CM | POA: Insufficient documentation

## 2020-09-22 DIAGNOSIS — M25521 Pain in right elbow: Secondary | ICD-10-CM | POA: Diagnosis not present

## 2020-09-22 DIAGNOSIS — M25631 Stiffness of right wrist, not elsewhere classified: Secondary | ICD-10-CM | POA: Diagnosis not present

## 2020-09-22 NOTE — Therapy (Signed)
[facility] [address]  [place], [place], [address] Phone: [phone]   Fax:  [phone]  Physical Therapy Treatment  Patient Details  Name: Samantha Shepard MRN: [id number] Date of Birth: [birth year] Referring Provider (PT): Billey Gosling, MD   Encounter Date: 09/22/2020   PT End of Session - 09/22/20 1548    Visit Number 2    Number of Visits 7    Date for PT Re-Evaluation 10/18/20    Authorization Type [employer]    PT Start Time 1696    PT Stop Time 1617    PT Time Calculation (min) 38 min    Activity Tolerance Patient tolerated treatment well    Behavior During Therapy [facility] for tasks assessed/performed           Past Medical History:  Diagnosis Date  . Allergy    dust, cats  . Anxiety   . Asthma    controlled  . Hyperlipidemia   . Hypertension   . Left breast mass   . Migraines     Past Surgical History:  Procedure Laterality Date  . BREAST EXCISIONAL BIOPSY Right 06/2019   benign  . BREAST LUMPECTOMY WITH RADIOACTIVE SEED LOCALIZATION Left 07/08/2019   Procedure: LEFT BREAST LUMPECTOMY WITH RADIOACTIVE SEED LOCALIZATION;  Surgeon: Jovita Kussmaul, MD;  Location: [facility];  Service: General;  Laterality: Left;  . COLONOSCOPY  2009   neg; [facility]  . thyroid cyst aspirated    . TONSILLECTOMY      There were no vitals filed for this visit.   Subjective Assessment - 09/22/20 1546    Subjective Pt. doing ok.  Has been performing HEP daily.    Pertinent History migraines, HTN, HLD, asthma, anxiety, L breast lumpectomy    Diagnostic tests 08/29/20 B elbow xray: negative    Patient Stated Goals get my arm to stop hurting    Currently in Pain? Yes    Pain Score 7     Pain Location Elbow    Pain Orientation Left;Right    Pain Descriptors / Indicators Burning    Pain Type Acute pain;Chronic pain    Multiple Pain Sites No                              [facility] Adult PT Treatment/Exercise - 09/22/20 0001      Exercises   Exercises Elbow      Elbow Exercises   Forearm Supination Right;10 reps;AROM    Forearm Pronation Right;AROM;10 reps      Hand Exercises   Other Hand Exercises towel grip 5" x 10    Other Hand Exercises Stovell grip 15# x 10 reps      Wrist Exercises   Wrist Flexion Right;10 reps;Seated;Bar weights/barbell    Bar Weights/Barbell (Wrist Flexion) 1 lb    Wrist Extension Right;10 reps;Seated;Bar weights/barbell;Strengthening    Bar Weights/Barbell (Wrist Extension) 1 lb    Other wrist exercises R wrist flexion stretch 2 x 30 sec    Other wrist exercises R wrist extension stretch 2 x 30 sec                    PT Short Term Goals - 09/22/20 1548      PT SHORT TERM GOAL #1   Title Patient to be independent with initial HEP.    Time 3    Period Weeks  Status On-going    Target Date 09/27/20             PT Long Term Goals - 09/22/20 1548      PT LONG TERM GOAL #1   Title Patient to be independent with advanced HEP.    Time 6    Period Weeks    Status On-going      PT LONG TERM GOAL #2   Title Patient to demonstrate B elbow and wrist strength >/=4+/5 and B grip strength atleast 25 lbs.    Time 6    Period Weeks    Status On-going      PT LONG TERM GOAL #3   Title Patient to demonstrate B wrist and elbow AROM WFL and without pain limiting.    Time 6    Period Weeks    Status On-going      PT LONG TERM GOAL #4   Title Patient to report tolerance for using her iPad for 1 hour without pain limiting.    Time 6    Period Weeks    Status On-going      PT LONG TERM GOAL #5   Title Patient to report tolerance for cleaning activities around the house without pain limiting.    Time 6    Period Weeks    Status On-going                 Plan - 09/22/20 1549    Clinical Impression Statement Alexanderia noting some R elbow soreness however notes she  overdid wrist flexion 2# exercise.  Progressed to 1# wrist extension, flexion and stretching.  Pt. tolerated all activities in session well and able to demo good overall understanding of HEP.    Comorbidities migraines, HTN, HLD, asthma, anxiety, L breast lumpectomy    Rehab Potential Good    PT Frequency 1x / week    PT Duration 6 weeks    PT Treatment/Interventions ADLs/Self Care Home Management;Cryotherapy;Electrical Stimulation;Iontophoresis 4mg /ml Dexamethasone;Moist Heat;Therapeutic exercise;Therapeutic activities;Functional mobility training;Ultrasound;Neuromuscular re-education;Patient/family education;Manual techniques;Vasopneumatic Device;Taping;Splinting;Energy conservation;Dry needling;Passive range of motion    PT Next Visit Plan rogress B elbow and wP rist ROM and strength; STM wrist flexors/extensors    Consulted and Agree with Plan of Care Patient           Patient will benefit from skilled therapeutic intervention in order to improve the following deficits and impairments:  Hypomobility,Increased edema,Decreased activity tolerance,Decreased strength,Increased fascial restricitons,Pain,Impaired UE functional use,Increased muscle spasms,Improper body mechanics,Decreased range of motion,Postural dysfunction,Impaired flexibility  Visit Diagnosis: Pain in right elbow  Pain in left elbow  Stiffness of right wrist, not elsewhere classified  Stiffness of left wrist, not elsewhere classified     Problem List Patient Active Problem List   Diagnosis Date Noted  . Moderate persistent asthma with acute exacerbation 08/31/2020  . Gastroesophageal reflux disease 08/31/2020  . Cough 08/31/2020  . Tendinitis 08/15/2020  . Sleep difficulties 07/29/2020  . Mild intermittent asthma without complication 76/28/3151  . History of epistaxis 07/14/2019  . Temporomandibular joint (TMJ) pain 06/16/2018  . Anxiety 06/16/2018  . Cervical adenopathy 06/16/2018  . Jaw deformity 06/16/2018   . Acute sinus infection 10/04/2017  . Prediabetes 08/01/2017  . Tingling of both feet 04/18/2016  . Osteopenia 01/15/2016  . Mild persistent asthma 01/10/2016  . Other allergic rhinitis 01/10/2016  . Hyperlipidemia 08/13/2008  . Migraine headache 09/22/2007  . Uncontrolled hypertension 09/22/2007  . Allergic rhinitis 05/26/2007    Bess Harvest, PTA 09/22/20 6:11 PM  [facility] [address]  [place], [place], [address] Phone: [phone]   Fax:  [phone]  Name: Page Pucciarelli MRN: [id number] Date of Birth: 12-Jun-1956

## 2020-09-27 NOTE — Progress Notes (Signed)
I, Samantha Shepard, LAT, ATC, am serving as scribe for Dr. Lynne Shepard.  Samantha Shepard is a 64 y.o. female who presents to [place] [facility] today for f/u of chronic B elbow pain, R>L.  She was last seen by Dr. Georgina Snell on 08/29/20 w/ c/o B medial elbow pain and was referred to PT and prescribed nitroglycerin patches.  She has completed 2 PT sessions.  Since her last visit, pt reports that her B medial elbows are about the same.  Reaching and driving are her most aggravating activities.  She states that she has been doing her HEP.  She has stopped using the nitroglycerin patches as they did not seem to help and made her feel worse.  Diagnostic testing: R and L elbow XR- 08/29/20  Pertinent review of systems: No fevers or chills  Relevant historical information: Hypertension   Exam:  BP 112/68 (BP Location: Right Arm, Patient Position: Sitting, Cuff Size: Normal)   Pulse 83   Ht 5\' 1"  (1.549 m)   Wt 155 lb 6.4 oz (70.5 kg)   SpO2 99%   BMI 29.36 kg/m  General: Well Developed, well nourished, and in no acute distress.   MSK: Right elbow normal-appearing normal motion tender palpation medial epicondyle.  Some pain with resisted wrist flexion. Left elbow normal-appearing normal motion mildly tender palpation medial epicondyle.  Some pain with resisted wrist flexion.    Lab and Radiology Results DG ELBOW COMPLETE LEFT (3+VIEW)  Result Date: 08/29/2020 CLINICAL DATA:  Elbow pain. EXAM: LEFT ELBOW - COMPLETE 3+ VIEW COMPARISON:  None. FINDINGS: There is no evidence of fracture, dislocation, or joint effusion. There is no evidence of arthropathy or other focal bone abnormality. Soft tissues are unremarkable. IMPRESSION: Negative. Electronically Signed   By: Constance Holster M.D.   On: 08/29/2020 23:44   DG ELBOW COMPLETE RIGHT (3+VIEW)  Result Date: 08/29/2020 CLINICAL DATA:  Elbow pain. EXAM: RIGHT ELBOW - COMPLETE 3+ VIEW COMPARISON:  None. FINDINGS:  There is no evidence of fracture, dislocation, or joint effusion. There is no evidence of arthropathy or other focal bone abnormality. Soft tissues are unremarkable. IMPRESSION: Negative. Electronically Signed   By: Constance Holster M.D.   On: 08/29/2020 23:41   [place] LIMITED JOINT SPACE STRUCTURES UP BILAT(NO LINKED CHARGES)  Result Date: 09/07/2020 Diagnostic Limited MSK Ultrasound of: Right elbow Medial epicondyles visualized.  Normal appearance of common flexor tendon insertion onto medial epicondyle.  Increased elbow activity insertion site indicating tendinopathy.  No visible tear present. Lateral epicondyle visualized hyperechoic change in most superficial portion of lateral epicondyle consistent with osteophyte.  No tear or increased vascular activity. Impression: Medial epicondylitis  I, Samantha Shepard, personally (independently) visualized and performed the interpretation of the XRAY images attached in this note.     Assessment and Plan: 64 y.o. female with bilateral elbow pain right worse than left.  Pain thought to be due to medial epicondylitis.  This is been a long ongoing problem worse recently.  So far she has had just a little bit of physical therapy.  We discussed options.  Plan to give this a little bit longer with dedicated physical therapy approximately about a month longer.  If not much better at that point we will proceed to MRI of the more side currently right to further evaluate cause of pain and for potential injection or surgical planning.    Discussed warning signs or symptoms. Please see discharge instructions. Patient expresses understanding.   The  above documentation has been reviewed and is accurate and complete Samantha Shepard, M.D.

## 2020-09-28 ENCOUNTER — Encounter: Payer: Self-pay | Admitting: Family Medicine

## 2020-09-28 ENCOUNTER — Other Ambulatory Visit: Payer: Self-pay

## 2020-09-28 ENCOUNTER — Ambulatory Visit: Payer: Medicare PPO | Admitting: Family Medicine

## 2020-09-28 VITALS — BP 112/68 | HR 83 | Ht 61.0 in | Wt 155.4 lb

## 2020-09-28 DIAGNOSIS — M25521 Pain in right elbow: Secondary | ICD-10-CM | POA: Diagnosis not present

## 2020-09-28 DIAGNOSIS — M25522 Pain in left elbow: Secondary | ICD-10-CM

## 2020-09-28 DIAGNOSIS — M7700 Medial epicondylitis, unspecified elbow: Secondary | ICD-10-CM | POA: Diagnosis not present

## 2020-09-28 NOTE — Patient Instructions (Addendum)
Thank you for coming in today.  Continue the PT and home exercises.  Try yellow theraband flexbar.   If not improving in another month let me know and I will arrange for an MRI of the worst side (currently the right).   Keep me updated.

## 2020-09-29 ENCOUNTER — Other Ambulatory Visit: Payer: Self-pay

## 2020-09-29 ENCOUNTER — Ambulatory Visit: Payer: Medicare PPO

## 2020-09-29 DIAGNOSIS — M25521 Pain in right elbow: Secondary | ICD-10-CM | POA: Diagnosis not present

## 2020-09-29 DIAGNOSIS — M25522 Pain in left elbow: Secondary | ICD-10-CM | POA: Diagnosis not present

## 2020-09-29 DIAGNOSIS — M25632 Stiffness of left wrist, not elsewhere classified: Secondary | ICD-10-CM | POA: Diagnosis not present

## 2020-09-29 DIAGNOSIS — M25631 Stiffness of right wrist, not elsewhere classified: Secondary | ICD-10-CM

## 2020-09-29 NOTE — Therapy (Signed)
[facility] [address]  [place], [place], [address] Phone: [phone]   Fax:  [phone]  Physical Therapy Treatment  Patient Details  Name: Samantha Shepard MRN: [id number] Date of Birth: 1956-06-09 Referring Provider (PT): Billey Gosling, MD   Encounter Date: 09/29/2020   PT End of Session - 09/29/20 1540    Visit Number 3    Number of Visits 7    Date for PT Re-Evaluation 10/18/20    Authorization Type [employer]    PT Start Time [id number]    PT Stop Time 1620    PT Time Calculation (min) 46 min    Activity Tolerance Patient tolerated treatment well    Behavior During Therapy [facility] for tasks assessed/performed           Past Medical History:  Diagnosis Date  . Allergy    dust, cats  . Anxiety   . Asthma    controlled  . Hyperlipidemia   . Hypertension   . Left breast mass   . Migraines     Past Surgical History:  Procedure Laterality Date  . BREAST EXCISIONAL BIOPSY Right 06/2019   benign  . BREAST LUMPECTOMY WITH RADIOACTIVE SEED LOCALIZATION Left 07/08/2019   Procedure: LEFT BREAST LUMPECTOMY WITH RADIOACTIVE SEED LOCALIZATION;  Surgeon: Jovita Kussmaul, MD;  Location: [facility];  Service: General;  Laterality: Left;  . COLONOSCOPY  2009   neg; [facility]  . thyroid cyst aspirated    . TONSILLECTOMY      There were no vitals filed for this visit.   Subjective Assessment - 09/29/20 1538    Subjective Pt reports she saw her MD yesterday who gave her options if therapy is not helping, i.e. MRI, injection, surgery. Pt verbalizes really not wanting any of these and feels better after therapy. She would really like to come 2x/week.    Pertinent History migraines, HTN, HLD, asthma, anxiety, L breast lumpectomy    Diagnostic tests 08/29/20 B elbow xray: negative    Patient Stated Goals get my arm to stop hurting    Currently in Pain? Yes    Pain Score 5     Pain Location  Elbow    Pain Orientation Right;Medial    Pain Descriptors / Indicators Burning    Pain Type Acute pain;Chronic pain                             [facility] Adult PT Treatment/Exercise - 09/29/20 0001      Self-Care   Self-Care Posture;Other Self-Care Comments    Posture importance of shoulder stability and upper back mobility/strength for elbow stability    Other Self-Care Comments  HEP additions, FR recommendation      Exercises   Exercises Shoulder      Shoulder Exercises: Seated   External Rotation Both;15 reps;Theraband    Theraband Level (Shoulder External Rotation) Level 1 (Yellow)    External Rotation Limitations + scap retraction with B hand towel rolls    Diagonals Strengthening;Right;12 reps    Theraband Level (Shoulder Diagonals) Level 1 (Yellow)    Diagonals Limitations D2 flexion      Shoulder Exercises: Standing   Diagonals 12 reps;Right;Theraband    Theraband Level (Shoulder Diagonals) Level 1 (Yellow)    Diagonals Limitations D2 Flexion    Other Standing Exercises serratus slides on door with yellow TB around wrists, pinching therapad b/t elbows  Shoulder Exercises: ROM/Strengthening   UBE (Upper Arm Bike) 6 min fwd L 1.5      Wrist Exercises   Wrist Flexion Right;15 reps    Bar Weights/Barbell (Wrist Flexion) 2 lbs    Wrist Flexion Limitations eccentric      Manual Therapy   Manual Therapy Soft tissue mobilization;Passive ROM                  PT Education - 09/29/20 1645    Education Details HEP addition, FR recommendation    Person(s) Educated Patient    Methods Explanation;Demonstration;Tactile cues;Verbal cues;Handout    Comprehension Verbal cues required;Returned demonstration;Verbalized understanding;Tactile cues required            PT Short Term Goals - 09/22/20 1548      PT SHORT TERM GOAL #1   Title Patient to be independent with initial HEP.    Time 3    Period Weeks    Status On-going    Target Date  09/27/20             PT Long Term Goals - 09/22/20 1548      PT LONG TERM GOAL #1   Title Patient to be independent with advanced HEP.    Time 6    Period Weeks    Status On-going      PT LONG TERM GOAL #2   Title Patient to demonstrate B elbow and wrist strength >/=4+/5 and B grip strength atleast 25 lbs.    Time 6    Period Weeks    Status On-going      PT LONG TERM GOAL #3   Title Patient to demonstrate B wrist and elbow AROM WFL and without pain limiting.    Time 6    Period Weeks    Status On-going      PT LONG TERM GOAL #4   Title Patient to report tolerance for using her iPad for 1 hour without pain limiting.    Time 6    Period Weeks    Status On-going      PT LONG TERM GOAL #5   Title Patient to report tolerance for cleaning activities around the house without pain limiting.    Time 6    Period Weeks    Status On-going                 Plan - 09/29/20 1540    Clinical Impression Statement Pt was performing concentric wrist flexion at home which was painful, but noticed a difference with extension and asked to progress to 2# with no problems. Pt TTP with multiple adhesions to pronator teres and wrist flexors. Reported relief following manual therapy and stretching, as well as addition of FR for improved thoracic extension. Pt tolerated initiation of periscapular strength well, providing yellow theraband for home program.    Comorbidities migraines, HTN, HLD, asthma, anxiety, L breast lumpectomy    Rehab Potential Good    PT Frequency 1x / week    PT Duration 6 weeks    PT Treatment/Interventions ADLs/Self Care Home Management;Cryotherapy;Electrical Stimulation;Iontophoresis 4mg /ml Dexamethasone;Moist Heat;Therapeutic exercise;Therapeutic activities;Functional mobility training;Ultrasound;Neuromuscular re-education;Patient/family education;Manual techniques;Vasopneumatic Device;Taping;Splinting;Energy conservation;Dry needling;Passive range of motion    PT  Next Visit Plan Progress B elbow and wrist ROM and strength, periscapular strength and thoracic mobility; STM wrist flexors/extensors    PT Home Exercise Plan [email or web address]    Consulted and Agree with Plan of Care Patient  Patient will benefit from skilled therapeutic intervention in order to improve the following deficits and impairments:  Hypomobility,Increased edema,Decreased activity tolerance,Decreased strength,Increased fascial restricitons,Pain,Impaired UE functional use,Increased muscle spasms,Improper body mechanics,Decreased range of motion,Postural dysfunction,Impaired flexibility  Visit Diagnosis: Pain in right elbow  Pain in left elbow  Stiffness of right wrist, not elsewhere classified  Stiffness of left wrist, not elsewhere classified     Problem List Patient Active Problem List   Diagnosis Date Noted  . Moderate persistent asthma with acute exacerbation 08/31/2020  . Gastroesophageal reflux disease 08/31/2020  . Cough 08/31/2020  . Tendinitis 08/15/2020  . Sleep difficulties 07/29/2020  . Mild intermittent asthma without complication [id number]  . History of epistaxis 07/14/2019  . Temporomandibular joint (TMJ) pain 06/16/2018  . Anxiety 06/16/2018  . Cervical adenopathy 06/16/2018  . 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 Acute sinus infection 10/04/2017  . Prediabetes 08/01/2017  . Tingling of both feet 04/18/2016  . Osteopenia 01/15/2016  . Mild persistent asthma 01/10/2016  . Other allergic rhinitis 01/10/2016  . Hyperlipidemia 08/13/2008  . Migraine headache 09/22/2007  . Uncontrolled hypertension 09/22/2007  . Allergic rhinitis 05/26/2007    Izell , PT, DPT 09/29/2020, 4:48 PM  [facility] [address]  [place], [place], [address] Phone: [phone]   Fax:  [phone]  Name: Kerina Lamoreux MRN: [id number] Date of Birth: August 18, 1956

## 2020-09-29 NOTE — Patient Instructions (Signed)
Access Code: [email or web address] URL: [email or web address] Date: 09/29/2020 Prepared by: Kathreen Cornfield  Exercises Serratus Activation at [place] with Foam Roll and Resistance Band - 1 x daily - 7 x weekly - 2 sets - 10 reps Shoulder External Rotation and Scapular Retraction with Resistance - 1 x daily - 7 x weekly - 2 sets - 10 reps - 5 seconds hold Shoulder PNF D2 with Resistance - 1 x daily - 7 x weekly - 2 sets - 10 reps Thoracic Extension Mobilization on Foam Roll - 1 x daily - 7 x weekly - 2 sets - 10 reps

## 2020-09-30 IMAGING — MG DIGITAL SCREENING BILAT W/ TOMO W/ CAD
8 series · 8 of 24 positions shown · non-contrast
Comparison: Previous exam(s).

CLINICAL DATA: Screening.

EXAM:
DIGITAL SCREENING BILATERAL MAMMOGRAM WITH TOMO AND CAD

[R CC synth-2D]
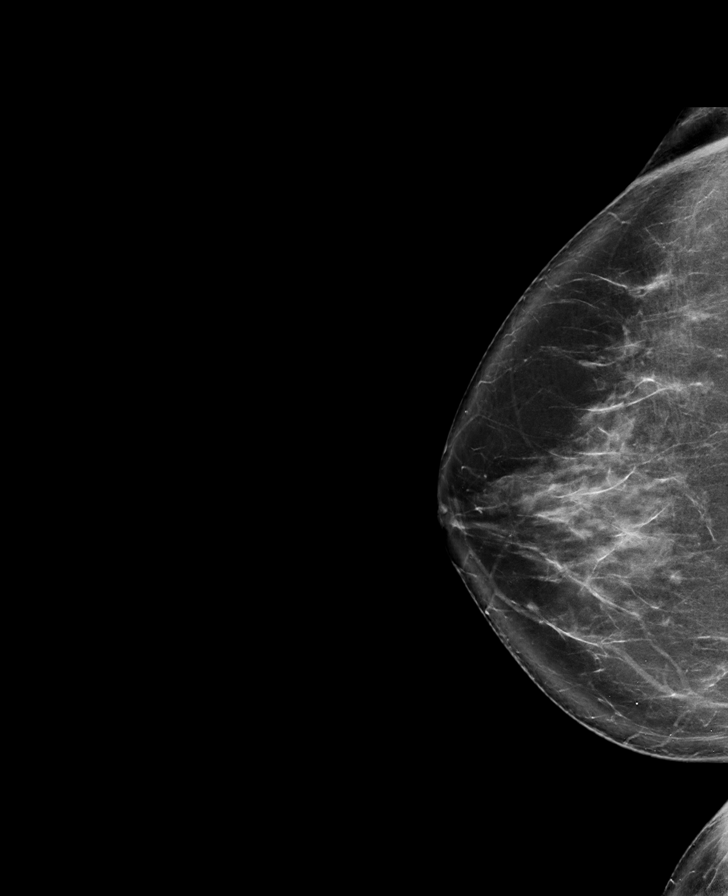

[L MLO synth-2D]
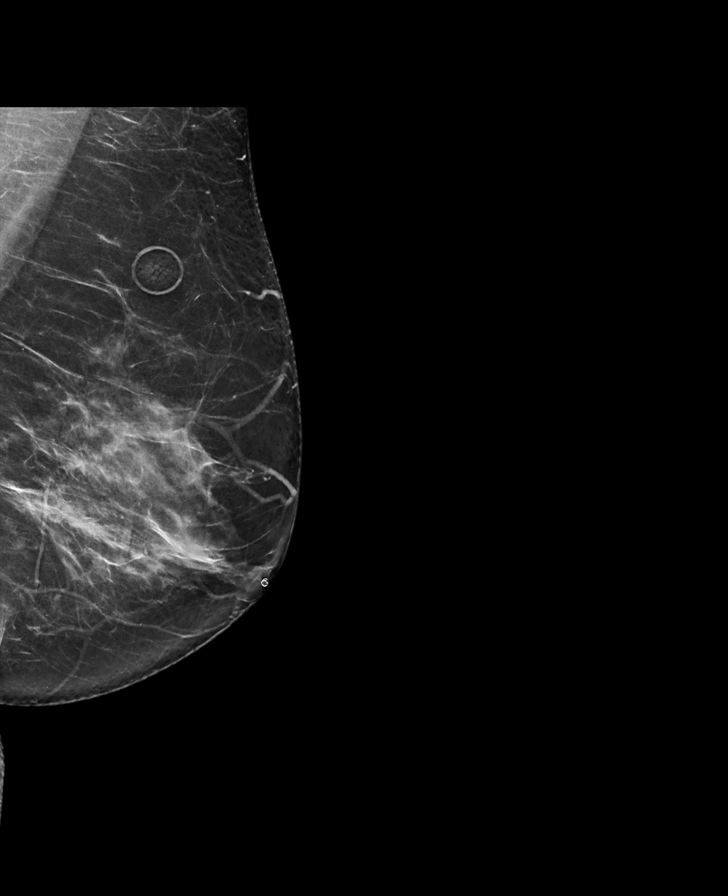

[L CC synth-2D]
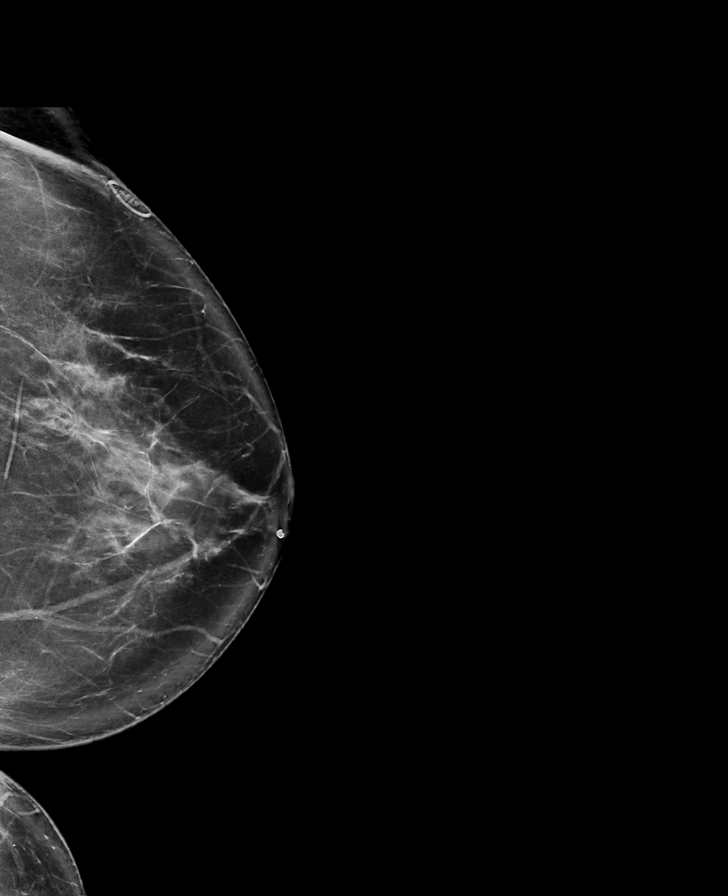

[R MLO synth-2D]
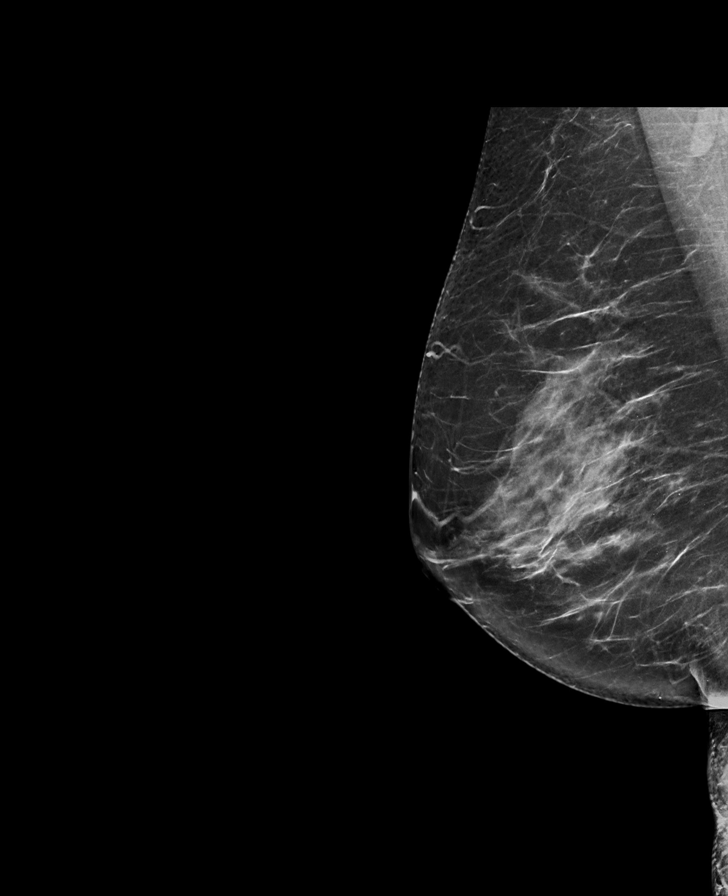

[R MLO tomo · tomo slice 41/81.0]
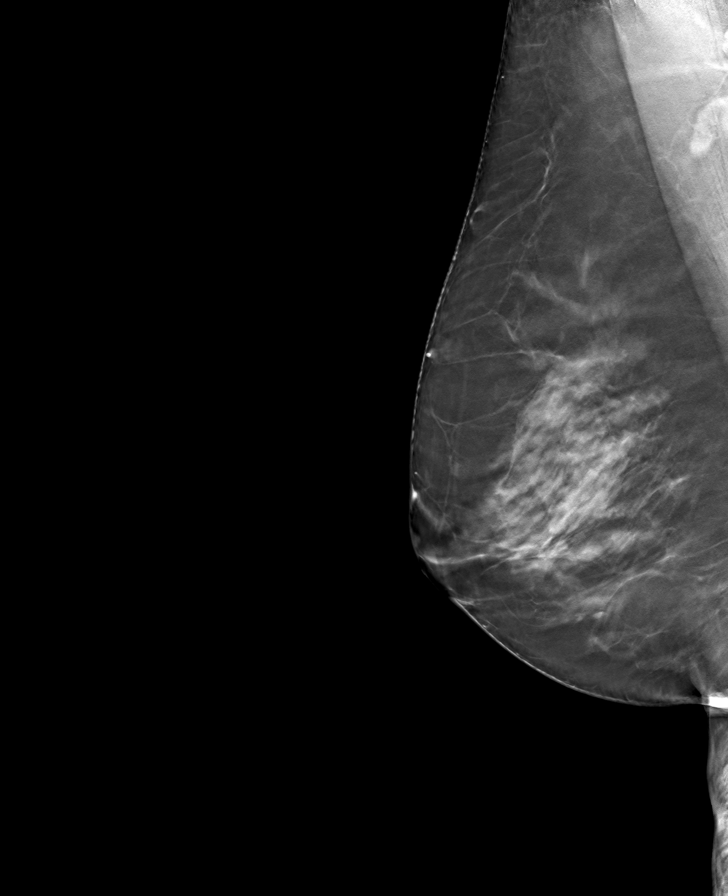

[L MLO tomo · tomo slice 41/82.0]
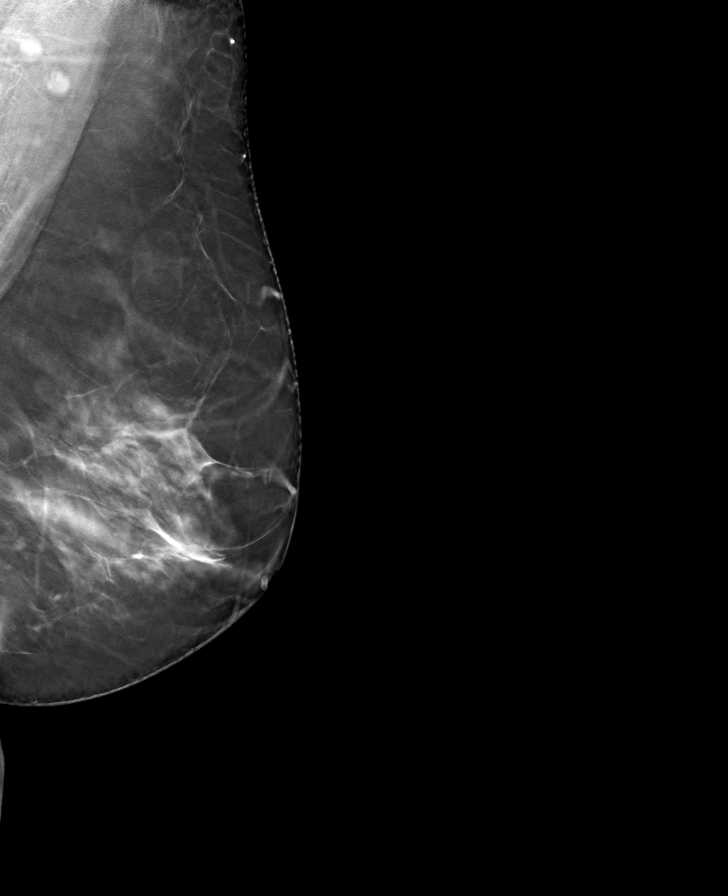

[L CC tomo · tomo slice 45/90.0]
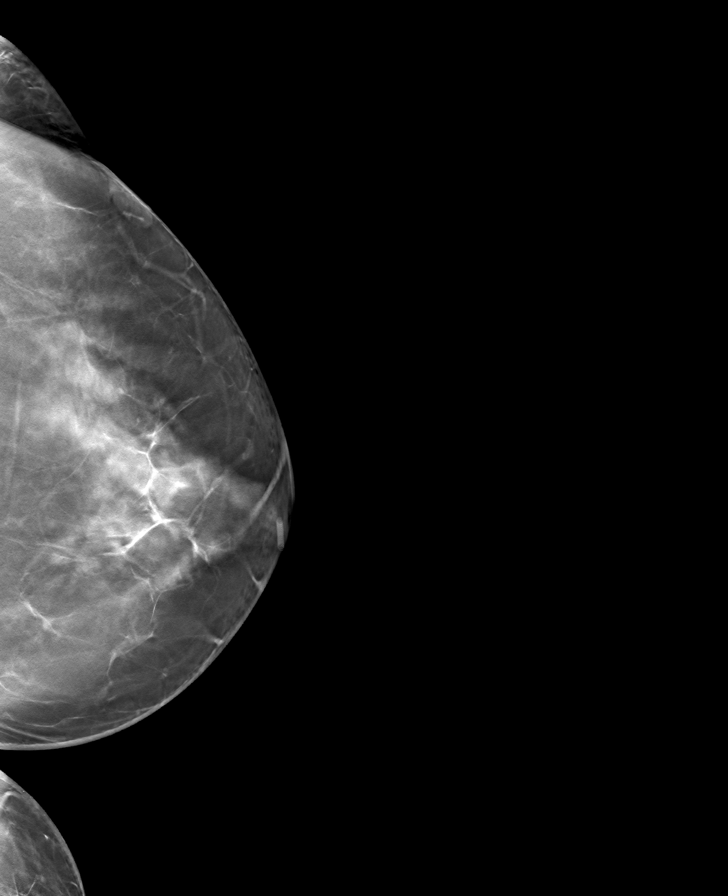

[R CC tomo · tomo slice 43/85.0]
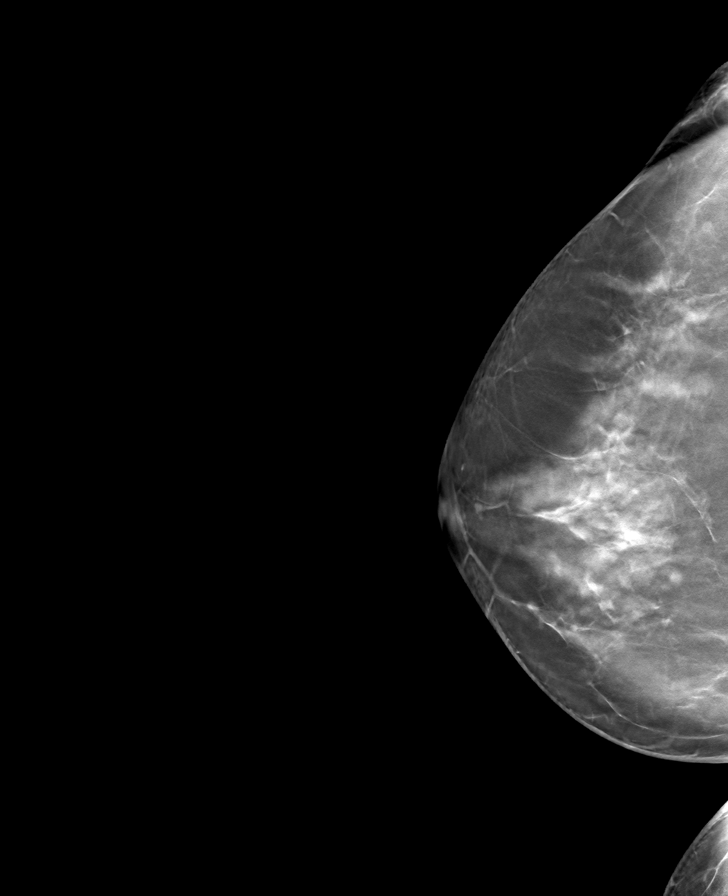

[8 of 24 positions shown; findings below may reference images not displayed]

ACR Breast Density Category c: The breast tissue is heterogeneously
dense, which may obscure small masses.
FINDINGS: There are no findings suspicious for malignancy. Images were
processed with CAD.
IMPRESSION: No mammographic evidence of malignancy. A result letter of this
screening mammogram will be mailed directly to the patient.

RECOMMENDATION:
Screening mammogram in one year. (Code:[id number]-[id number])

BI-RADS CATEGORY  1: Negative.

## 2020-10-06 ENCOUNTER — Other Ambulatory Visit: Payer: Self-pay

## 2020-10-06 ENCOUNTER — Ambulatory Visit: Payer: Medicare PPO

## 2020-10-06 DIAGNOSIS — M25522 Pain in left elbow: Secondary | ICD-10-CM

## 2020-10-06 DIAGNOSIS — M25632 Stiffness of left wrist, not elsewhere classified: Secondary | ICD-10-CM | POA: Diagnosis not present

## 2020-10-06 DIAGNOSIS — M25521 Pain in right elbow: Secondary | ICD-10-CM | POA: Diagnosis not present

## 2020-10-06 DIAGNOSIS — M25631 Stiffness of right wrist, not elsewhere classified: Secondary | ICD-10-CM | POA: Diagnosis not present

## 2020-10-06 NOTE — Therapy (Signed)
[facility] [address]  [place], [place], [address] Phone: [phone]   Fax:  [phone]  Physical Therapy Treatment  Patient Details  Name: Samantha Shepard MRN: [id number] Date of Birth: 08/06/1956 Referring Provider (PT): Billey Gosling, MD   Encounter Date: 10/06/2020   PT End of Session - 10/06/20 1541    Visit Number 4    Number of Visits 7    Date for PT Re-Evaluation 10/18/20    Authorization Type [employer]    PT Start Time 1884    PT Stop Time 1619    PT Time Calculation (min) 48 min    Activity Tolerance Patient tolerated treatment well    Behavior During Therapy [facility] for tasks assessed/performed           Past Medical History:  Diagnosis Date  . Allergy    dust, cats  . Anxiety   . Asthma    controlled  . Hyperlipidemia   . Hypertension   . Left breast mass   . Migraines     Past Surgical History:  Procedure Laterality Date  . BREAST EXCISIONAL BIOPSY Right 06/2019   benign  . BREAST LUMPECTOMY WITH RADIOACTIVE SEED LOCALIZATION Left 07/08/2019   Procedure: LEFT BREAST LUMPECTOMY WITH RADIOACTIVE SEED LOCALIZATION;  Surgeon: Jovita Kussmaul, MD;  Location: [facility];  Service: General;  Laterality: Left;  . COLONOSCOPY  2009   neg; [facility]  . thyroid cyst aspirated    . TONSILLECTOMY      There were no vitals filed for this visit.   Subjective Assessment - 10/06/20 1539    Subjective Pt. noting some pain with wrist extension/flexion exercise at home.    Pertinent History migraines, HTN, HLD, asthma, anxiety, L breast lumpectomy    Diagnostic tests 08/29/20 B elbow xray: negative    Patient Stated Goals get my arm to stop hurting    Currently in Pain? Yes    Pain Score 6     Pain Location Elbow    Pain Orientation Right;Medial    Pain Descriptors / Indicators Burning    Pain Type Acute pain;Chronic pain    Multiple Pain Sites No                              [facility] Adult PT Treatment/Exercise - 10/06/20 0001      Elbow Exercises   Elbow Flexion Right;10 reps;Strengthening    Bar Weights/Barbell (Elbow Flexion) 3 lbs    Forearm Supination Right;10 reps;AROM    Forearm Supination Limitations mid hammer grip    Forearm Pronation Right;10 reps;AROM    Forearm Pronation Limitations mid hammer grip      Shoulder Exercises: ROM/Strengthening   UBE (Upper Arm Bike) 6 min fwd L 1.5      Hand Exercises   Other Hand Exercises Stovell grip 15# x 15 reps      Wrist Exercises   Wrist Flexion Right;15 reps;Strengthening    Bar Weights/Barbell (Wrist Flexion) 1 lb    Wrist Extension Right;15 reps;Strengthening;Bar weights/barbell    Bar Weights/Barbell (Wrist Extension) 1 lb    Other wrist exercises R wrist flexion stretch 2 x 30 sec    Other wrist exercises R wrist extension stretch 2 x 30 sec      Manual Therapy   Manual Therapy Soft tissue mobilization;Myofascial release    Manual therapy comments supine  Soft tissue mobilization R wrist flexor and extensor STM/DTM - multiple TP evident    Myofascial Release TPR to mutliple spots in R wrist extensors                  PT Education - 10/06/20 1629    Education Details HEP update    Person(s) Educated Patient    Methods Explanation;Demonstration;Verbal cues;Handout    Comprehension Verbalized understanding;Returned demonstration;Verbal cues required            PT Short Term Goals - 10/06/20 1542      PT SHORT TERM GOAL #1   Title Patient to be independent with initial HEP.    Time 3    Period Weeks    Status Partially Met    Target Date 09/27/20             PT Long Term Goals - 09/22/20 1548      PT LONG TERM GOAL #1   Title Patient to be independent with advanced HEP.    Time 6    Period Weeks    Status On-going      PT LONG TERM GOAL #2   Title Patient to demonstrate B elbow and wrist strength >/=4+/5 and B grip strength  atleast 25 lbs.    Time 6    Period Weeks    Status On-going      PT LONG TERM GOAL #3   Title Patient to demonstrate B wrist and elbow AROM WFL and without pain limiting.    Time 6    Period Weeks    Status On-going      PT LONG TERM GOAL #4   Title Patient to report tolerance for using her iPad for 1 hour without pain limiting.    Time 6    Period Weeks    Status On-going      PT LONG TERM GOAL #5   Title Patient to report tolerance for cleaning activities around the house without pain limiting.    Time 6    Period Weeks    Status On-going                 Plan - 10/06/20 1542    Clinical Impression Statement Samantha Shepard doing ok.  Notes good improvement in pain since starting therapy.  Does still have medial/lateral elbow pain with max effort opening jars and resisted wrist extension in session.  Progressed pronation/supination strengthening along with initiation of elbow flexion 3# curl.  Pt. with increased pain with body blade shoulder IR/ER + wrist isometrics thus terminated.  Ended visit with pt. noting pain returned to 0/10.    Comorbidities migraines, HTN, HLD, asthma, anxiety, L breast lumpectomy    Rehab Potential Good    PT Frequency 1x / week    PT Duration 6 weeks    PT Treatment/Interventions ADLs/Self Care Home Management;Cryotherapy;Electrical Stimulation;Iontophoresis 53m/ml Dexamethasone;Moist Heat;Therapeutic exercise;Therapeutic activities;Functional mobility training;Ultrasound;Neuromuscular re-education;Patient/family education;Manual techniques;Vasopneumatic Device;Taping;Splinting;Energy conservation;Dry needling;Passive range of motion    PT Next Visit Plan Progress B elbow and wrist ROM and strength, periscapular strength and thoracic mobility; STM wrist flexors/extensors    Consulted and Agree with Plan of Care Patient           Patient will benefit from skilled therapeutic intervention in order to improve the following deficits and impairments:   Hypomobility,Increased edema,Decreased activity tolerance,Decreased strength,Increased fascial restricitons,Pain,Impaired UE functional use,Increased muscle spasms,Improper body mechanics,Decreased range of motion,Postural dysfunction,Impaired flexibility  Visit Diagnosis: Pain in right elbow  Pain in left elbow  Stiffness of right wrist, not elsewhere classified     Problem List Patient Active Problem List   Diagnosis Date Noted  . Moderate persistent asthma with acute exacerbation 08/31/2020  . Gastroesophageal reflux disease 08/31/2020  . Cough 08/31/2020  . Tendinitis 08/15/2020  . Sleep difficulties 07/29/2020  . Mild intermittent asthma without complication 74/66/3556  . History of epistaxis 07/14/2019  . Temporomandibular joint (TMJ) pain 06/16/2018  . Anxiety 06/16/2018  . Cervical adenopathy 06/16/2018  . 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 Acute sinus infection 10/04/2017  . Prediabetes 08/01/2017  . Tingling of both feet 04/18/2016  . Osteopenia 01/15/2016  . Mild persistent asthma 01/10/2016  . Other allergic rhinitis 01/10/2016  . Hyperlipidemia 08/13/2008  . Migraine headache 09/22/2007  . Uncontrolled hypertension 09/22/2007  . Allergic rhinitis 05/26/2007    Samantha Shepard, PTA 10/06/20 5:06 PM   [facility] [address]  [place], [place], [address] Phone: [phone]   Fax:  [phone]  Name: Samantha Shepard MRN: [id number] Date of Birth: 09/13/1956

## 2020-10-10 ENCOUNTER — Telehealth: Payer: Self-pay | Admitting: Family Medicine

## 2020-10-10 MED ORDER — ALVESCO 80 MCG/ACT IN AERS
INHALATION_SPRAY | RESPIRATORY_TRACT | 1 refills | Status: DC
Start: 2020-10-10 — End: 2020-10-13

## 2020-10-10 NOTE — Telephone Encounter (Signed)
Pt requests a call back, she needs refill for Alvesco but could not remember the procedure for her refills per [facility]. All she remembers is she may be able to get two for the price of one and wasn't sure if it was mail order. If prescription is going to pharmacy it should go to  [id number] - [place], [place] - [address]

## 2020-10-10 NOTE — Telephone Encounter (Signed)
[facility] to [facility] and called pharmacy they stated Rx needed a PA. Insurance requires her to try/fail Arnuity. They will fax PA request to [place].

## 2020-10-10 NOTE — Telephone Encounter (Signed)
PA submitted through cover my meds, patient has tried failed asmanex and flovent 220.

## 2020-10-11 NOTE — Telephone Encounter (Signed)
Received a denial from [facility] for the Alvesco because pt needs to try Arnuity Ellipta. Please advise.

## 2020-10-12 NOTE — Telephone Encounter (Signed)
If she likes Alvesco can you [facility] it to the [facility] and have them use the cash price. Should be 2 inhalers for $50 dollars. Thank you

## 2020-10-13 ENCOUNTER — Ambulatory Visit: Payer: Medicare PPO | Attending: Internal Medicine

## 2020-10-13 ENCOUNTER — Other Ambulatory Visit: Payer: Self-pay

## 2020-10-13 DIAGNOSIS — M25631 Stiffness of right wrist, not elsewhere classified: Secondary | ICD-10-CM | POA: Diagnosis not present

## 2020-10-13 DIAGNOSIS — M25632 Stiffness of left wrist, not elsewhere classified: Secondary | ICD-10-CM | POA: Diagnosis not present

## 2020-10-13 DIAGNOSIS — M25521 Pain in right elbow: Secondary | ICD-10-CM | POA: Insufficient documentation

## 2020-10-13 DIAGNOSIS — M25522 Pain in left elbow: Secondary | ICD-10-CM | POA: Diagnosis not present

## 2020-10-13 MED ORDER — ALVESCO 80 MCG/ACT IN AERS
INHALATION_SPRAY | RESPIRATORY_TRACT | 3 refills | Status: DC
Start: 2020-10-13 — End: 2021-02-14

## 2020-10-13 NOTE — Telephone Encounter (Signed)
I [facility]- [facility] and follow up with pricing

## 2020-10-13 NOTE — Telephone Encounter (Signed)
Spoke with pharmacy and they are mailing the 2 inhalers to her for $50 and they are calling her to let her know. The manufacture of Alvesco offers the program through self pay for the discount.

## 2020-10-13 NOTE — Therapy (Signed)
[facility] [address]  [place], [place], [address] Phone: [phone]   Fax:  [phone]  Physical Therapy Treatment  Patient Details  Name: Samantha Shepard MRN: [id number] Date of Birth: 07-May-1956 Referring Provider (PT): Billey Gosling, MD   Encounter Date: 10/13/2020   PT End of Session - 10/13/20 1538    Visit Number 5    Number of Visits 7    Date for PT Re-Evaluation 10/18/20    Authorization Type [employer]    PT Start Time 1533    PT Stop Time 1615    PT Time Calculation (min) 42 min    Activity Tolerance Patient tolerated treatment well    Behavior During Therapy [facility] for tasks assessed/performed           Past Medical History:  Diagnosis Date  . Allergy    dust, cats  . Anxiety   . Asthma    controlled  . Hyperlipidemia   . Hypertension   . Left breast mass   . Migraines     Past Surgical History:  Procedure Laterality Date  . BREAST EXCISIONAL BIOPSY Right 06/2019   benign  . BREAST LUMPECTOMY WITH RADIOACTIVE SEED LOCALIZATION Left 07/08/2019   Procedure: LEFT BREAST LUMPECTOMY WITH RADIOACTIVE SEED LOCALIZATION;  Surgeon: Jovita Kussmaul, MD;  Location: [facility];  Service: General;  Laterality: Left;  . COLONOSCOPY  2009   neg; [facility]  . thyroid cyst aspirated    . TONSILLECTOMY      There were no vitals filed for this visit.   Subjective Assessment - 10/13/20 1536    Subjective Doing ok.    Pertinent History migraines, HTN, HLD, asthma, anxiety, L breast lumpectomy    Diagnostic tests 08/29/20 B elbow xray: negative    Patient Stated Goals get my arm to stop hurting    Currently in Pain? No/denies    Pain Score 0-No pain    Pain Location Elbow    Pain Orientation Right;Medial    Pain Descriptors / Indicators Burning                             [facility] Adult PT Treatment/Exercise - 10/13/20 0001      Elbow Exercises    Forearm Supination Right;10 reps;AROM    Forearm Supination Limitations end hammer grip    Forearm Pronation Right;10 reps;AROM    Forearm Pronation Limitations end hammer grip      Shoulder Exercises: ROM/Strengthening   UBE (Upper Arm Bike) 6 min fwd L 2.0    Cybex Row 15 reps    Cybex Row Limitations low 10#      Wrist Exercises   Wrist Flexion Right;10 reps;Seated;Bar weights/barbell;Strengthening    Bar Weights/Barbell (Wrist Flexion) 2 lbs    Wrist Extension Right;10 reps;Bar weights/barbell;Strengthening    Bar Weights/Barbell (Wrist Extension) 2 lbs    Wrist Radial Deviation Right;10 reps;Strengthening    Wrist Radial Deviation Limitations hammer    Wrist Ulnar Deviation Right;10 reps;Strengthening    Other wrist exercises R wrist flexion stretch 2 x 30 sec    Other wrist exercises R wrist extension stretch 2 x 30 sec                  PT Education - 10/13/20 1634    Education Details HEP update    Person(s) Educated Patient    Methods Explanation;Demonstration;Verbal cues;Handout  Comprehension Verbalized understanding;Returned demonstration;Verbal cues required            PT Short Term Goals - 10/13/20 1556      PT SHORT TERM GOAL #1   Title Patient to be independent with initial HEP.    Time 3    Period Weeks    Status Achieved    Target Date 09/27/20             PT Long Term Goals - 09/22/20 1548      PT LONG TERM GOAL #1   Title Patient to be independent with advanced HEP.    Time 6    Period Weeks    Status On-going      PT LONG TERM GOAL #2   Title Patient to demonstrate B elbow and wrist strength >/=4+/5 and B grip strength atleast 25 lbs.    Time 6    Period Weeks    Status On-going      PT LONG TERM GOAL #3   Title Patient to demonstrate B wrist and elbow AROM WFL and without pain limiting.    Time 6    Period Weeks    Status On-going      PT LONG TERM GOAL #4   Title Patient to report tolerance for using her iPad for 1  hour without pain limiting.    Time 6    Period Weeks    Status On-going      PT LONG TERM GOAL #5   Title Patient to report tolerance for cleaning activities around the house without pain limiting.    Time 6    Period Weeks    Status On-going                 Plan - 10/13/20 1539    Clinical Impression Statement Pt. denies questions with initial HEP.  STG #1 met.  Pt. noting she is able to use the I-pad for 10 min before she is limited by pain.  Progressing toward LTG #4.  Progressed repetitions and resistance with most wrist, forearm, and elbow strengthening activities today without increased pain.    Comorbidities migraines, HTN, HLD, asthma, anxiety, L breast lumpectomy    Rehab Potential Good    PT Frequency 1x / week    PT Treatment/Interventions ADLs/Self Care Home Management;Cryotherapy;Electrical Stimulation;Iontophoresis 7m/ml Dexamethasone;Moist Heat;Therapeutic exercise;Therapeutic activities;Functional mobility training;Ultrasound;Neuromuscular re-education;Patient/family education;Manual techniques;Vasopneumatic Device;Taping;Splinting;Energy conservation;Dry needling;Passive range of motion    PT Next Visit Plan Progress B elbow and wrist ROM and strength, periscapular strength and thoracic mobility; STM wrist flexors/extensors    Consulted and Agree with Plan of Care Patient           Patient will benefit from skilled therapeutic intervention in order to improve the following deficits and impairments:  Hypomobility,Increased edema,Decreased activity tolerance,Decreased strength,Increased fascial restricitons,Pain,Impaired UE functional use,Increased muscle spasms,Improper body mechanics,Decreased range of motion,Postural dysfunction,Impaired flexibility  Visit Diagnosis: Pain in right elbow  Pain in left elbow  Stiffness of right wrist, not elsewhere classified  Stiffness of left wrist, not elsewhere classified     Problem List Patient Active Problem  List   Diagnosis Date Noted  . Moderate persistent asthma with acute exacerbation 08/31/2020  . Gastroesophageal reflux disease 08/31/2020  . Cough 08/31/2020  . Tendinitis 08/15/2020  . Sleep difficulties 07/29/2020  . Mild intermittent asthma without complication 149/67/5916 . History of epistaxis 07/14/2019  . Temporomandibular joint (TMJ) pain 06/16/2018  . Anxiety 06/16/2018  . Cervical adenopathy 06/16/2018  .  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 Acute sinus infection 10/04/2017  . Prediabetes 08/01/2017  . Tingling of both feet 04/18/2016  . Osteopenia 01/15/2016  . Mild persistent asthma 01/10/2016  . Other allergic rhinitis 01/10/2016  . Hyperlipidemia 08/13/2008  . Migraine headache 09/22/2007  . Uncontrolled hypertension 09/22/2007  . Allergic rhinitis 05/26/2007    Bess Harvest, PTA 10/13/20 6:13 PM   [facility] [address]  [place], [place], [address] Phone: [phone]   Fax:  [phone]  Name: Laniesha Das MRN: [id number] Date of Birth: 12/05/1955

## 2020-10-17 ENCOUNTER — Ambulatory Visit: Payer: Medicare PPO | Admitting: Physical Therapy

## 2020-10-17 ENCOUNTER — Other Ambulatory Visit: Payer: Self-pay

## 2020-10-17 ENCOUNTER — Encounter: Payer: Self-pay | Admitting: Physical Therapy

## 2020-10-17 DIAGNOSIS — M25631 Stiffness of right wrist, not elsewhere classified: Secondary | ICD-10-CM

## 2020-10-17 DIAGNOSIS — M25521 Pain in right elbow: Secondary | ICD-10-CM

## 2020-10-17 DIAGNOSIS — M25522 Pain in left elbow: Secondary | ICD-10-CM

## 2020-10-17 DIAGNOSIS — M25632 Stiffness of left wrist, not elsewhere classified: Secondary | ICD-10-CM | POA: Diagnosis not present

## 2020-10-17 NOTE — Therapy (Signed)
[facility] [address]  [place], [place], [address] Phone: [phone]   Fax:  [phone]  Physical Therapy Treatment  Patient Details  Name: Samantha Shepard MRN: [id number] Date of Birth: 01/17/1956 Referring Provider (PT): Billey Gosling, MD   Encounter Date: 10/17/2020   PT End of Session - 10/17/20 1513    Visit Number 6    Number of Visits 18    Date for PT Re-Evaluation 11/28/20    Authorization Type [employer]    PT Start Time 8502    PT Stop Time 1405    PT Time Calculation (min) 51 min    Activity Tolerance Patient tolerated treatment well    Behavior During Therapy [facility] for tasks assessed/performed           Past Medical History:  Diagnosis Date  . Allergy    dust, cats  . Anxiety   . Asthma    controlled  . Hyperlipidemia   . Hypertension   . Left breast mass   . Migraines     Past Surgical History:  Procedure Laterality Date  . BREAST EXCISIONAL BIOPSY Right 06/2019   benign  . BREAST LUMPECTOMY WITH RADIOACTIVE SEED LOCALIZATION Left 07/08/2019   Procedure: LEFT BREAST LUMPECTOMY WITH RADIOACTIVE SEED LOCALIZATION;  Surgeon: Jovita Kussmaul, MD;  Location: [place];  Service: General;  Laterality: Left;  . COLONOSCOPY  2009   neg;  GI  . thyroid cyst aspirated    . TONSILLECTOMY      There were no vitals filed for this visit.   Subjective Assessment - 10/17/20 1312    Subjective Feels like she has good days and bad days. Had tried to play on her ipad for "too long" for an hour or more with the arm resting on the table but noted pain afterwards. Also noted some pain in the R elbow after doing some dusting this AM.    Pertinent History migraines, HTN, HLD, asthma, anxiety, L breast lumpectomy    Diagnostic tests 08/29/20 B elbow xray: negative    Patient Stated Goals get my arm to stop hurting    Currently in Pain? Yes    Pain Score 6     Pain  Location Elbow    Pain Orientation Right    Pain Descriptors / Indicators Burning    Pain Type Acute pain              [facility] PT Assessment - 10/17/20 1323      Assessment   Medical Diagnosis Tendinitis    Referring Provider (PT) Billey Gosling, MD    Onset Date/Surgical Date 07/07/20      Observation/Other Assessments   Focus on Therapeutic Outcomes (FOTO)  wrist: 57 (43% limited)      AROM   Right Elbow Flexion 156   pain   Right Elbow Extension 5    Left Elbow Flexion 155    Left Elbow Extension 5    Right Wrist Extension 65 Degrees    Right Wrist Flexion 72 Degrees    Right Wrist Radial Deviation 22 Degrees   mild pain   Right Wrist Ulnar Deviation 30 Degrees    Left Wrist Extension 68 Degrees    Left Wrist Flexion 70 Degrees    Left Wrist Radial Deviation 24 Degrees    Left Wrist Ulnar Deviation 28 Degrees      Strength   Strength Assessment Site Shoulder  Right/Left Shoulder Right;Left    Right Elbow Flexion 5/5    Right Elbow Extension 5/5    Left Elbow Flexion 5/5    Left Elbow Extension 5/5    Right Forearm Pronation 4+/5    Right Forearm Supination 4+/5    Left Forearm Pronation 4+/5    Left Forearm Supination 4+/5    Right Wrist Flexion 4+/5    Right Wrist Extension 4/5    Left Wrist Flexion 4+/5    Left Wrist Extension 4+/5    Right Hand Grip (lbs) 22.67   25, 23, 20   Right Hand 3 Point Pinch 14.66 lbs   15, 14, 15   Left Hand Grip (lbs) 22.33   28, 20, 19   Left Hand 3 Point Pinch 14.33 lbs   15, 14, 14                        [facility] Adult PT Treatment/Exercise - 10/17/20 0001      Shoulder Exercises: ROM/Strengthening   UBE (Upper Arm Bike) L2.0 3 min forward/3 min back      Wrist Exercises   Wrist Flexion Right;10 reps;Seated;Bar weights/barbell;Strengthening;Left    Bar Weights/Barbell (Wrist Flexion) 3 lbs    Wrist Flexion Limitations --   concentric wrist flexion, eccentric wrist extension   Wrist Extension Right;10  reps;Bar weights/barbell;Strengthening;Left    Bar Weights/Barbell (Wrist Extension) 3 lbs    Wrist Extension Limitations --   concentric wrist extension, eccentric wrist flexion     Modalities   Modalities Iontophoresis      Iontophoresis   Type of Iontophoresis Dexamethasone    Location --   R medial elbow   Dose 1.0 ml 83mA*minutes    Time 4 hour patch                  PT Education - 10/17/20 1512    Education Details update to HEP; edu on ionto wear time/removal, precautions; discussion on subjective/objective progress thus far; edu on ice massage to R medial elbow ~5 min avoiding ulnar nerve    Person(s) Educated Patient    Methods Explanation;Demonstration;Tactile cues;Verbal cues;Handout    Comprehension Returned demonstration;Verbalized understanding            PT Short Term Goals - 10/17/20 1320      PT SHORT TERM GOAL #1   Title Patient to be independent with initial HEP.    Time 3    Period Weeks    Status Achieved    Target Date 09/27/20             PT Long Term Goals - 10/17/20 1320      PT LONG TERM GOAL #1   Title Patient to be independent with advanced HEP.    Time 6    Period Weeks    Status On-going   met for current   Target Date 11/28/20      PT LONG TERM GOAL #2   Title Patient to demonstrate B elbow and wrist strength >/=4+/5 and B grip strength atleast 25 lbs.    Time 6    Period Weeks    Status Partially Met   improvement in R elbow flexion/extension, R wrist flexion, L wrist extension, and B full handed grip   Target Date 11/28/20      PT LONG TERM GOAL #3   Title Patient to demonstrate B wrist and elbow AROM WFL and without pain limiting.    Time  6    Period Weeks    Status Partially Met   improved in R wrist flexion/extension, L wrist flexion, and R elbow extension   Target Date 11/28/20      PT LONG TERM GOAL #4   Title Patient to report tolerance for using her iPad for 1 hour without pain limiting.    Time 6     Period Weeks    Status On-going   attempted 1 hour, but with pain   Target Date 11/28/20      PT LONG TERM GOAL #5   Title Patient to report tolerance for cleaning activities around the house without pain limiting.    Time 6    Period Weeks    Status On-going   reports trying to dust for ~45 minutes with resulting pain   Target Date 11/28/20                 Plan - 10/17/20 1514    Clinical Impression Statement Patient reporting minor improvement in B elbows since initial eval. However, still notes that she is unable to use her iPad and unable to clean without delayed onset of pain in B elbows. Patient does report benefit from massage as well as wrist splint worn on R side. Strength testing today revealed improvement in R elbow flexion/extension, R wrist flexion, L wrist extension, and B full handed grip. AROM has improved in R wrist flexion/extension, L wrist flexion, and R elbow extension. Patient still has room for improvement in B wrist strength and wrist flexion/extension ROM. Able to progress to eccentric wrist strengthening today without issues. Educated patient on use of ice massage with care to avoid ulnar nerve. Also educated patient on iontophoresis wear time, removal, precautions. Applied ionto to R medial elbow for pain relief. Patient reported understanding of all edu provided today and without complaints at end of session. Patient is progressing slowly towards goals. Would benefit from additional skilled PT services 2x/week for 6 weeks to address remaining goals.    Comorbidities migraines, HTN, HLD, asthma, anxiety, L breast lumpectomy    Rehab Potential Good    PT Frequency 2x / week    PT Duration 6 weeks    PT Treatment/Interventions ADLs/Self Care Home Management;Cryotherapy;Electrical Stimulation;Iontophoresis 79m/ml Dexamethasone;Moist Heat;Therapeutic exercise;Therapeutic activities;Functional mobility training;Ultrasound;Neuromuscular re-education;Patient/family  education;Manual techniques;Vasopneumatic Device;Taping;Splinting;Energy conservation;Dry needling;Passive range of motion    PT Next Visit Plan Progress B elbow and wrist ROM and strength, periscapular strength and thoracic mobility; STM wrist flexors/extensors    Consulted and Agree with Plan of Care Patient           Patient will benefit from skilled therapeutic intervention in order to improve the following deficits and impairments:  Hypomobility,Increased edema,Decreased activity tolerance,Decreased strength,Increased fascial restricitons,Pain,Impaired UE functional use,Increased muscle spasms,Improper body mechanics,Decreased range of motion,Postural dysfunction,Impaired flexibility  Visit Diagnosis: Pain in right elbow  Pain in left elbow  Stiffness of right wrist, not elsewhere classified  Stiffness of left wrist, not elsewhere classified     Problem List Patient Active Problem List   Diagnosis Date Noted  . Moderate persistent asthma with acute exacerbation 08/31/2020  . Gastroesophageal reflux disease 08/31/2020  . Cough 08/31/2020  . Tendinitis 08/15/2020  . Sleep difficulties 07/29/2020  . Mild intermittent asthma without complication 123/95/3202 . History of epistaxis 07/14/2019  . Temporomandibular joint (TMJ) pain 06/16/2018  . Anxiety 06/16/2018  . Cervical adenopathy 06/16/2018  . 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 Acute sinus infection 10/04/2017  . Prediabetes 08/01/2017  .  Tingling of both feet 04/18/2016  . Osteopenia 01/15/2016  . Mild persistent asthma 01/10/2016  . Other allergic rhinitis 01/10/2016  . Hyperlipidemia 08/13/2008  . Migraine headache 09/22/2007  . Uncontrolled hypertension 09/22/2007  . Allergic rhinitis 05/26/2007    Janene Harvey, PT, DPT 10/17/20 3:21 PM   [facility] [address]  [place], [place], [address] Phone: [phone]   Fax:  [phone]  Name:  Tiffnay Bossi MRN: [id number] Date of Birth: 01/05/1956

## 2020-10-17 NOTE — Patient Instructions (Signed)

## 2020-10-20 ENCOUNTER — Other Ambulatory Visit: Payer: Self-pay | Admitting: Internal Medicine

## 2020-10-24 ENCOUNTER — Ambulatory Visit: Payer: Medicare PPO | Admitting: Physical Therapy

## 2020-10-27 ENCOUNTER — Other Ambulatory Visit: Payer: Self-pay

## 2020-10-27 ENCOUNTER — Ambulatory Visit: Payer: Medicare PPO | Admitting: Physical Therapy

## 2020-10-27 ENCOUNTER — Encounter: Payer: Self-pay | Admitting: Physical Therapy

## 2020-10-27 DIAGNOSIS — M25631 Stiffness of right wrist, not elsewhere classified: Secondary | ICD-10-CM

## 2020-10-27 DIAGNOSIS — M25632 Stiffness of left wrist, not elsewhere classified: Secondary | ICD-10-CM | POA: Diagnosis not present

## 2020-10-27 DIAGNOSIS — M25521 Pain in right elbow: Secondary | ICD-10-CM

## 2020-10-27 DIAGNOSIS — M25522 Pain in left elbow: Secondary | ICD-10-CM

## 2020-10-27 NOTE — Therapy (Signed)
[facility] [address]  [place], [place], [address] Phone: [phone]   Fax:  [phone]  Physical Therapy Treatment  Patient Details  Name: Samantha Shepard MRN: [id number] Date of Birth: 1955-12-17 Referring Provider (PT): Billey Gosling, MD   Encounter Date: 10/27/2020   PT End of Session - 10/27/20 1548    Visit Number 7    Number of Visits 18    Date for PT Re-Evaluation 11/28/20    Authorization Type [employer]    PT Start Time 1448    PT Stop Time 1541    PT Time Calculation (min) 53 min    Activity Tolerance Patient tolerated treatment well;Patient limited by pain    Behavior During Therapy [facility] for tasks assessed/performed           Past Medical History:  Diagnosis Date  . Allergy    dust, cats  . Anxiety   . Asthma    controlled  . Hyperlipidemia   . Hypertension   . Left breast mass   . Migraines     Past Surgical History:  Procedure Laterality Date  . BREAST EXCISIONAL BIOPSY Right 06/2019   benign  . BREAST LUMPECTOMY WITH RADIOACTIVE SEED LOCALIZATION Left 07/08/2019   Procedure: LEFT BREAST LUMPECTOMY WITH RADIOACTIVE SEED LOCALIZATION;  Surgeon: Jovita Kussmaul, MD;  Location: [facility];  Service: General;  Laterality: Left;  . COLONOSCOPY  2009   neg; [facility]  . thyroid cyst aspirated    . TONSILLECTOMY      There were no vitals filed for this visit.   Subjective Assessment - 10/27/20 1448    Subjective Has been "not great." However, does note benefit from ionto patch. Also notes that she is having some pain over the upper arms without known cause. Denies recent changes in activities, however has tried to avoid doing any UE exercises during her usual walks.    Pertinent History migraines, HTN, HLD, asthma, anxiety, L breast lumpectomy    Diagnostic tests 08/29/20 B elbow xray: negative    Patient Stated Goals get my arm to stop hurting     Currently in Pain? Yes    Pain Score 6     Pain Location Elbow    Pain Orientation Right;Left    Pain Descriptors / Indicators Burning    Pain Type Acute pain    Pain Radiating Towards forearm              [facility] PT Assessment - 10/27/20 0001      Strength   Right Shoulder Flexion 4/5    Right Shoulder ABduction 4/5    Right Shoulder Internal Rotation 4/5    Right Shoulder External Rotation 4/5    Left Shoulder Flexion 4+/5    Left Shoulder ABduction 4+/5    Left Shoulder Internal Rotation 4+/5    Left Shoulder External Rotation 4/5                         [facility] Adult PT Treatment/Exercise - 10/27/20 0001      Shoulder Exercises: Supine   Protraction Strengthening;Both;10 reps    Protraction Weight (lbs) 3    Protraction Limitations 2x10; good form    Horizontal ABduction Strengthening;Both;10 reps;Theraband    Theraband Level (Shoulder Horizontal ABduction) Level 1 (Yellow)    Horizontal ABduction Limitations 2x10; cues for scap retraction    External Rotation Strengthening;Both;10 reps;Theraband  Theraband Level (Shoulder External Rotation) Level 1 (Yellow)    External Rotation Limitations 2x10; cues for scap squeeze      Shoulder Exercises: ROM/Strengthening   UBE (Upper Arm Bike) L2.0 3 min forward/3 min back      Wrist Exercises   Wrist Flexion Right;Seated;Bar weights/barbell;Strengthening;Left;15 reps    Bar Weights/Barbell (Wrist Flexion) 3 lbs    Wrist Flexion Limitations conc/ecc    Wrist Extension Right;Bar weights/barbell;Strengthening;Left;10 reps    Bar Weights/Barbell (Wrist Extension) 3 lbs    Wrist Extension Limitations conc/ecc    Other wrist exercises B wrist flexion stretch on hi-low table 2 x 30 sec    Other wrist exercises B wrist extension stretch on hi-low table 2 x 30 sec   cues to maintain elbows straight     Iontophoresis   Type of Iontophoresis Dexamethasone   patch #2   Location --   R medial elbow   Dose 1.0 ml  30m*minutes    Time 4 hour patch                  PT Education - 10/27/20 1543    Education Details update to HEP; edu on ionto wear time and removal; edu on use of anterior and R/L diagonal prayer stretch to address LB tightness    Person(s) Educated Patient    Methods Explanation    Comprehension Verbalized understanding            PT Short Term Goals - 10/17/20 1320      PT SHORT TERM GOAL #1   Title Patient to be independent with initial HEP.    Time 3    Period Weeks    Status Achieved    Target Date 09/27/20             PT Long Term Goals - 10/17/20 1320      PT LONG TERM GOAL #1   Title Patient to be independent with advanced HEP.    Time 6    Period Weeks    Status On-going   met for current   Target Date 11/28/20      PT LONG TERM GOAL #2   Title Patient to demonstrate B elbow and wrist strength >/=4+/5 and B grip strength atleast 25 lbs.    Time 6    Period Weeks    Status Partially Met   improvement in R elbow flexion/extension, R wrist flexion, L wrist extension, and B full handed grip   Target Date 11/28/20      PT LONG TERM GOAL #3   Title Patient to demonstrate B wrist and elbow AROM WFL and without pain limiting.    Time 6    Period Weeks    Status Partially Met   improved in R wrist flexion/extension, L wrist flexion, and R elbow extension   Target Date 11/28/20      PT LONG TERM GOAL #4   Title Patient to report tolerance for using her iPad for 1 hour without pain limiting.    Time 6    Period Weeks    Status On-going   attempted 1 hour, but with pain   Target Date 11/28/20      PT LONG TERM GOAL #5   Title Patient to report tolerance for cleaning activities around the house without pain limiting.    Time 6    Period Weeks    Status On-going   reports trying to dust for ~45 minutes with resulting pain  Target Date 11/28/20                 Plan - 10/27/20 1548    Clinical Impression Statement Patient reporting  benefit from ionto patch applied at previous session. Also noting new onset of pain in B upper arms as well as a tight spot over her R mid/low back without known cause. Denies recent changes in activities, however has tried to avoid doing any UE exercises during her usual walks. Assessed shoulder strength, with R shoulder testing weaker than L, which is patient's more symptomatic arm. Worked on progressive scapular stability ther-ex to address this, with cueing for positioning proper periscapular activation. Advised patient to increase reps with resisted wrist flexion d/t good tolerance and control today, however to maintain same reps with resisted extension d/t mild pain remaining in the elbow. Patient with questions about how to address her mid/low back pain, thus she was educated on gentle posterior chain stretching with report of good benefit. Ended session with ionto patch application to R elbow. Patient reported understanding of all edu provided today and without complaints at end of session.    Comorbidities migraines, HTN, HLD, asthma, anxiety, L breast lumpectomy    Rehab Potential Good    PT Frequency 2x / week    PT Duration 6 weeks    PT Treatment/Interventions ADLs/Self Care Home Management;Cryotherapy;Electrical Stimulation;Iontophoresis 1m/ml Dexamethasone;Moist Heat;Therapeutic exercise;Therapeutic activities;Functional mobility training;Ultrasound;Neuromuscular re-education;Patient/family education;Manual techniques;Vasopneumatic Device;Taping;Splinting;Energy conservation;Dry needling;Passive range of motion    PT Next Visit Plan Progress B shoulder, elbow, and wrist ROM and strength, periscapular strength and thoracic mobility; STM wrist flexors/extensors    Consulted and Agree with Plan of Care Patient           Patient will benefit from skilled therapeutic intervention in order to improve the following deficits and impairments:  Hypomobility,Increased edema,Decreased activity  tolerance,Decreased strength,Increased fascial restricitons,Pain,Impaired UE functional use,Increased muscle spasms,Improper body mechanics,Decreased range of motion,Postural dysfunction,Impaired flexibility  Visit Diagnosis: Pain in right elbow  Pain in left elbow  Stiffness of right wrist, not elsewhere classified  Stiffness of left wrist, not elsewhere classified     Problem List Patient Active Problem List   Diagnosis Date Noted  . Moderate persistent asthma with acute exacerbation 08/31/2020  . Gastroesophageal reflux disease 08/31/2020  . Cough 08/31/2020  . Tendinitis 08/15/2020  . Sleep difficulties 07/29/2020  . Mild intermittent asthma without complication 133/82/5053 . History of epistaxis 07/14/2019  . Temporomandibular joint (TMJ) pain 06/16/2018  . Anxiety 06/16/2018  . Cervical adenopathy 06/16/2018  . 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 Acute sinus infection 10/04/2017  . Prediabetes 08/01/2017  . Tingling of both feet 04/18/2016  . Osteopenia 01/15/2016  . Mild persistent asthma 01/10/2016  . Other allergic rhinitis 01/10/2016  . Hyperlipidemia 08/13/2008  . Migraine headache 09/22/2007  . Uncontrolled hypertension 09/22/2007  . Allergic rhinitis 05/26/2007     YJanene Harvey PT, DPT 10/27/20 3:56 PM   [facility] [place] [address]Phone: [phone]  Fax:  [phone] Name: PBelicia DifattaMRN: [id number] of Birth: [birth year]

## 2020-10-31 ENCOUNTER — Ambulatory Visit: Payer: Medicare PPO

## 2020-10-31 ENCOUNTER — Other Ambulatory Visit: Payer: Self-pay

## 2020-10-31 DIAGNOSIS — M25631 Stiffness of right wrist, not elsewhere classified: Secondary | ICD-10-CM | POA: Diagnosis not present

## 2020-10-31 DIAGNOSIS — M25521 Pain in right elbow: Secondary | ICD-10-CM | POA: Diagnosis not present

## 2020-10-31 DIAGNOSIS — M25632 Stiffness of left wrist, not elsewhere classified: Secondary | ICD-10-CM | POA: Diagnosis not present

## 2020-10-31 DIAGNOSIS — M25522 Pain in left elbow: Secondary | ICD-10-CM | POA: Diagnosis not present

## 2020-10-31 NOTE — Therapy (Signed)
[facility] [address]  [place], [place], [address] Phone: [phone]   Fax:  [phone]  Physical Therapy Treatment  Patient Details  Name: Samantha Shepard MRN: [id number] Date of Birth: 12-25-1955 Referring Provider (PT): Billey Gosling, MD   Encounter Date: 10/31/2020   PT End of Session - 10/31/20 1330    Visit Number 8    Number of Visits 18    Date for PT Re-Evaluation 11/28/20    Authorization Type [employer]    PT Start Time 0845    PT Stop Time 0930    PT Time Calculation (min) 45 min    Activity Tolerance Patient tolerated treatment well;Patient limited by pain    Behavior During Therapy [facility] for tasks assessed/performed           Past Medical History:  Diagnosis Date  . Allergy    dust, cats  . Anxiety   . Asthma    controlled  . Hyperlipidemia   . Hypertension   . Left breast mass   . Migraines     Past Surgical History:  Procedure Laterality Date  . BREAST EXCISIONAL BIOPSY Right 06/2019   benign  . BREAST LUMPECTOMY WITH RADIOACTIVE SEED LOCALIZATION Left 07/08/2019   Procedure: LEFT BREAST LUMPECTOMY WITH RADIOACTIVE SEED LOCALIZATION;  Surgeon: Jovita Kussmaul, MD;  Location: [place];  Service: General;  Laterality: Left;  . COLONOSCOPY  2009   neg; [facility]  . thyroid cyst aspirated    . TONSILLECTOMY      There were no vitals filed for this visit.   Subjective Assessment - 10/31/20 0854    Subjective Pt reports no new complaints since previous. Continues to do home exercises. Was washing and putting back some dishes at home the other day without usual pain or discomfort.    Pertinent History migraines, HTN, HLD, asthma, anxiety, L breast lumpectomy    Limitations Lifting;Reading;House hold activities    Diagnostic tests 08/29/20 B elbow xray: negative    Patient Stated Goals get my arm to stop hurting    Currently in Pain? Yes    Pain Score  3     Pain Location Elbow    Pain Orientation Right    Pain Radiating Towards forearm              [facility] Adult PT Treatment/Exercise - 10/31/20 0001      Shoulder Exercises: Supine   Protraction Weight (lbs) 3    Protraction Limitations 2x10; good form   Right & left   Horizontal ABduction Strengthening;Both;10 reps;Theraband   2 sets   Theraband Level (Shoulder Horizontal ABduction) Level 1 (Yellow)    Horizontal ABduction Limitations 2x10; cues for scap retraction     SEATED:   External Rotation   Strengthening;Both;10 reps;Theraband    Theraband Level (Shoulder External Rotation) Level 1 (Yellow)    External Rotation Limitations Midback stretch  2x10; cues for scap squeeze   yellow TB  30" x 2     Shoulder Exercises: ROM/Strengthening   UBE (Upper Arm Bike) L2.0 3 min forward/3 min back      Wrist Exercises   Wrist Flexion Right;Seated;Bar weights/barbell;Strengthening;Left;15 reps    Bar Weights/Barbell (Wrist Flexion) 3 lbs    Wrist Flexion Limitations conc/ecc    Wrist Extension Right;Bar weights/barbell;Strengthening;Left;10 reps    Bar Weights/Barbell (Wrist Extension) 3 lbs    Other wrist exercises B wrist flexion stretch on hi-low table 2  x 30 sec    Other wrist exercises B wrist extension stretch on hi-low table 2 x 30 sec      Manual Therapy   Manual Therapy Soft tissue mobilization;Myofascial release    Soft tissue mobilization R wrist flexor STM/DTM - multiple TP evident    Myofascial Release TPR to mutliple spots in R wrist extensors                    PT Short Term Goals - 10/17/20 1320      PT SHORT TERM GOAL #1   Title Patient to be independent with initial HEP.    Time 3    Period Weeks    Status Achieved    Target Date 09/27/20             PT Long Term Goals - 10/17/20 1320      PT LONG TERM GOAL #1   Title Patient to be independent with advanced HEP.    Time 6    Period Weeks    Status On-going   met for current    Target Date 11/28/20      PT LONG TERM GOAL #2   Title Patient to demonstrate B elbow and wrist strength >/=4+/5 and B grip strength atleast 25 lbs.    Time 6    Period Weeks    Status Partially Met   improvement in R elbow flexion/extension, R wrist flexion, L wrist extension, and B full handed grip   Target Date 11/28/20      PT LONG TERM GOAL #3   Title Patient to demonstrate B wrist and elbow AROM WFL and without pain limiting.    Time 6    Period Weeks    Status Partially Met   improved in R wrist flexion/extension, L wrist flexion, and R elbow extension   Target Date 11/28/20      PT LONG TERM GOAL #4   Title Patient to report tolerance for using her iPad for 1 hour without pain limiting.    Time 6    Period Weeks    Status On-going   attempted 1 hour, but with pain   Target Date 11/28/20      PT LONG TERM GOAL #5   Title Patient to report tolerance for cleaning activities around the house without pain limiting.    Time 6    Period Weeks    Status On-going   reports trying to dust for ~45 minutes with resulting pain   Target Date 11/28/20                 Plan - 10/31/20 1331    Clinical Impression Statement Pt tolerated exercises nicely today. Continued c/o a tight spot over the R mid/low back that was exacerbated with scapular retraction  or seated horizontal ABD exercises;  but pt reported relief of discomfort after trial of rhomboid/mid back stretch in sitting. With STM multiple tender spots identified at medial elbow/ wrist flexor tendons, spasm diminished with DTM and sustained pressure. Revewed seated prayer stretch for wrist flexors as an alternative to standing wrist flexor stretch at table ad pt demo'd good understanding.  Continue per POC.    Personal Factors and Comorbidities Age;Comorbidity 3+;Fitness;Past/Current Experience;Time since onset of injury/illness/exacerbation    Comorbidities migraines, HTN, HLD, asthma, anxiety, L breast lumpectomy     Examination-Activity Limitations Carry;Dressing;Hygiene/Grooming;Lift;Reach Overhead;Self Feeding    Examination-Participation Restrictions Cleaning;Community Activity;Shop;Driving;Yard Work;Laundry;Meal Prep    Rehab Potential Good  PT Frequency 2x / week    PT Duration 6 weeks    PT Treatment/Interventions ADLs/Self Care Home Management;Cryotherapy;Electrical Stimulation;Iontophoresis 16m/ml Dexamethasone;Moist Heat;Therapeutic exercise;Therapeutic activities;Functional mobility training;Ultrasound;Neuromuscular re-education;Patient/family education;Manual techniques;Vasopneumatic Device;Taping;Splinting;Energy conservation;Dry needling;Passive range of motion    PT Next Visit Plan Progress B shoulder, elbow, and wrist ROM and strength, periscapular strength and thoracic mobility; STM wrist flexors/extensors    PT Home Exercise Plan [id number]    Consulted and Agree with Plan of Care Patient           Patient will benefit from skilled therapeutic intervention in order to improve the following deficits and impairments:  Hypomobility,Increased edema,Decreased activity tolerance,Decreased strength,Increased fascial restricitons,Pain,Impaired UE functional use,Increased muscle spasms,Improper body mechanics,Decreased range of motion,Postural dysfunction,Impaired flexibility  Visit Diagnosis: Pain in right elbow  Pain in left elbow  Stiffness of left wrist, not elsewhere classified  Stiffness of right wrist, not elsewhere classified     Problem List Patient Active Problem List   Diagnosis Date Noted  . Moderate persistent asthma with acute exacerbation 08/31/2020  . Gastroesophageal reflux disease 08/31/2020  . Cough 08/31/2020  . Tendinitis 08/15/2020  . Sleep difficulties 07/29/2020  . Mild intermittent asthma without complication 161/51/8343 . History of epistaxis 07/14/2019  . Temporomandibular joint (TMJ) pain 06/16/2018  . Anxiety 06/16/2018  . Cervical adenopathy  06/16/2018  . 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 Acute sinus infection 10/04/2017  . Prediabetes 08/01/2017  . Tingling of both feet 04/18/2016  . Osteopenia 01/15/2016  . Mild persistent asthma 01/10/2016  . Other allergic rhinitis 01/10/2016  . Hyperlipidemia 08/13/2008  . Migraine headache 09/22/2007  . Uncontrolled hypertension 09/22/2007  . Allergic rhinitis 05/26/2007    MHall Busing PT, DPT 10/31/2020, 1:39 PM  [facility] [place] [address]Phone: [phone]  Fax:  [phone] Name: Samantha CozadMRN: [id number] of Birth: [birth year]

## 2020-11-01 ENCOUNTER — Other Ambulatory Visit: Payer: Self-pay | Admitting: Internal Medicine

## 2020-11-03 ENCOUNTER — Other Ambulatory Visit: Payer: Self-pay

## 2020-11-03 ENCOUNTER — Ambulatory Visit: Payer: Medicare PPO

## 2020-11-03 DIAGNOSIS — M25521 Pain in right elbow: Secondary | ICD-10-CM

## 2020-11-03 DIAGNOSIS — M25522 Pain in left elbow: Secondary | ICD-10-CM | POA: Diagnosis not present

## 2020-11-03 DIAGNOSIS — M25631 Stiffness of right wrist, not elsewhere classified: Secondary | ICD-10-CM | POA: Diagnosis not present

## 2020-11-03 DIAGNOSIS — M25632 Stiffness of left wrist, not elsewhere classified: Secondary | ICD-10-CM

## 2020-11-03 NOTE — Therapy (Signed)
[facility] [address]  [place], [place], [address] Phone: [phone]   Fax:  [phone]  Physical Therapy Treatment  Patient Details  Name: Samantha Shepard MRN: [id number] Date of Birth: Oct 16, 1955 Referring Provider (PT): Billey Gosling, MD   Encounter Date: 11/03/2020   PT End of Session - 11/03/20 0856    Visit Number 9    Number of Visits 18    Date for PT Re-Evaluation 11/28/20    Authorization Type [employer]    PT Start Time 0846    PT Stop Time 0926    PT Time Calculation (min) 40 min    Activity Tolerance Patient tolerated treatment well    Behavior During Therapy [facility] for tasks assessed/performed           Past Medical History:  Diagnosis Date  . Allergy    dust, cats  . Anxiety   . Asthma    controlled  . Hyperlipidemia   . Hypertension   . Left breast mass   . Migraines     Past Surgical History:  Procedure Laterality Date  . BREAST EXCISIONAL BIOPSY Right 06/2019   benign  . BREAST LUMPECTOMY WITH RADIOACTIVE SEED LOCALIZATION Left 07/08/2019   Procedure: LEFT BREAST LUMPECTOMY WITH RADIOACTIVE SEED LOCALIZATION;  Surgeon: Jovita Kussmaul, MD;  Location: [facility] [place];  Service: General;  Laterality: Left;  . COLONOSCOPY  2009   neg; [facility]  . thyroid cyst aspirated    . TONSILLECTOMY      There were no vitals filed for this visit.   Subjective Assessment - 11/03/20 0851    Subjective Pt. noting improved pain levels.    Pertinent History migraines, HTN, HLD, asthma, anxiety, L breast lumpectomy    Diagnostic tests 08/29/20 B elbow xray: negative    Patient Stated Goals get my arm to stop hurting    Currently in Pain? Yes    Pain Score 4     Pain Location Shoulder    Pain Orientation Left;Upper    Pain Descriptors / Indicators Burning    Pain Type Acute pain                             [facility] Adult PT Treatment/Exercise -  11/03/20 0001      Elbow Exercises   Forearm Supination Right;10 reps;AROM    Forearm Supination Limitations End grip hammer    Forearm Pronation Right;10 reps;AROM    Forearm Pronation Limitations End grip hammer      Shoulder Exercises: ROM/Strengthening   UBE (Upper Arm Bike) L2.0 3 min forward/3 min back      Shoulder Exercises: Stretch   Corner Stretch 2 reps;30 seconds    Corner Stretch Limitations doorway mid      Wrist Exercises   Wrist Flexion Right;Seated;Bar weights/barbell;Strengthening;Left;15 reps    Bar Weights/Barbell (Wrist Flexion) 3 lbs    Wrist Extension Right;Bar weights/barbell;Strengthening;Left;15 reps    Bar Weights/Barbell (Wrist Extension) 3 lbs    Wrist Radial Deviation Right;10 reps;Strengthening    Wrist Radial Deviation Limitations hammer end grip    Wrist Ulnar Deviation Right;Left;10 reps;Strengthening    Wrist Ulnar Deviation Limitations hammer end grip    Other wrist exercises B wrist flexion stretch on hi-low table 2 x 30 sec    Other wrist exercises B wrist extension stretch on hi-low table 2 x 30 sec  PT Short Term Goals - 10/17/20 1320      PT SHORT TERM GOAL #1   Title Patient to be independent with initial HEP.    Time 3    Period Weeks    Status Achieved    Target Date 09/27/20             PT Long Term Goals - 10/17/20 1320      PT LONG TERM GOAL #1   Title Patient to be independent with advanced HEP.    Time 6    Period Weeks    Status On-going   met for current   Target Date 11/28/20      PT LONG TERM GOAL #2   Title Patient to demonstrate B elbow and wrist strength >/=4+/5 and B grip strength atleast 25 lbs.    Time 6    Period Weeks    Status Partially Met   improvement in R elbow flexion/extension, R wrist flexion, L wrist extension, and B full handed grip   Target Date 11/28/20      PT LONG TERM GOAL #3   Title Patient to demonstrate B wrist and elbow AROM WFL and without pain  limiting.    Time 6    Period Weeks    Status Partially Met   improved in R wrist flexion/extension, L wrist flexion, and R elbow extension   Target Date 11/28/20      PT LONG TERM GOAL #4   Title Patient to report tolerance for using her iPad for 1 hour without pain limiting.    Time 6    Period Weeks    Status On-going   attempted 1 hour, but with pain   Target Date 11/28/20      PT LONG TERM GOAL #5   Title Patient to report tolerance for cleaning activities around the house without pain limiting.    Time 6    Period Weeks    Status On-going   reports trying to dust for ~45 minutes with resulting pain   Target Date 11/28/20                 Plan - 11/03/20 1052    Clinical Impression Statement Samantha Shepard reports she feels excellent improvement in her overall pain including elbow and back pain since last session.  Was able to tolerate progression of wrist/forearm and scapular strengthening activities today well without pain onset.    Comorbidities migraines, HTN, HLD, asthma, anxiety, L breast lumpectomy    Rehab Potential Good    PT Frequency 2x / week    PT Duration 6 weeks    PT Treatment/Interventions ADLs/Self Care Home Management;Cryotherapy;Electrical Stimulation;Iontophoresis 63m/ml Dexamethasone;Moist Heat;Therapeutic exercise;Therapeutic activities;Functional mobility training;Ultrasound;Neuromuscular re-education;Patient/family education;Manual techniques;Vasopneumatic Device;Taping;Splinting;Energy conservation;Dry needling;Passive range of motion    PT Next Visit Plan Progress B shoulder, elbow, and wrist ROM and strength, periscapular strength and thoracic mobility; STM wrist flexors/extensors    PT Home Exercise Plan [id number]    Consulted and Agree with Plan of Care Patient           Patient will benefit from skilled therapeutic intervention in order to improve the following deficits and impairments:  Hypomobility,Increased edema,Decreased activity  tolerance,Decreased strength,Increased fascial restricitons,Pain,Impaired UE functional use,Increased muscle spasms,Improper body mechanics,Decreased range of motion,Postural dysfunction,Impaired flexibility  Visit Diagnosis: Pain in right elbow  Pain in left elbow  Stiffness of left wrist, not elsewhere classified  Stiffness of right wrist, not elsewhere classified     Problem List Patient  Active Problem List   Diagnosis Date Noted  . Moderate persistent asthma with acute exacerbation 08/31/2020  . Gastroesophageal reflux disease 08/31/2020  . Cough 08/31/2020  . Tendinitis 08/15/2020  . Sleep difficulties 07/29/2020  . Mild intermittent asthma without complication 48/49/8651  . History of epistaxis 07/14/2019  . Temporomandibular joint (TMJ) pain 06/16/2018  . Anxiety 06/16/2018  . Cervical adenopathy 06/16/2018  . 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 Acute sinus infection 10/04/2017  . Prediabetes 08/01/2017  . Tingling of both feet 04/18/2016  . Osteopenia 01/15/2016  . Mild persistent asthma 01/10/2016  . Other allergic rhinitis 01/10/2016  . Hyperlipidemia 08/13/2008  . Migraine headache 09/22/2007  . Uncontrolled hypertension 09/22/2007  . Allergic rhinitis 05/26/2007    Bess Harvest, PTA 11/03/20 10:54 AM   [facility] [address]  [place], [place], [address] Phone: [phone]   Fax:  [phone]  Name: Samantha Shepard MRN: [id number] Date of Birth: 1955/10/17

## 2020-11-07 ENCOUNTER — Ambulatory Visit: Payer: Medicare PPO | Admitting: Physical Therapy

## 2020-11-07 ENCOUNTER — Encounter: Payer: Self-pay | Admitting: Physical Therapy

## 2020-11-07 ENCOUNTER — Other Ambulatory Visit: Payer: Self-pay

## 2020-11-07 DIAGNOSIS — M25632 Stiffness of left wrist, not elsewhere classified: Secondary | ICD-10-CM

## 2020-11-07 DIAGNOSIS — M25522 Pain in left elbow: Secondary | ICD-10-CM

## 2020-11-07 DIAGNOSIS — M25631 Stiffness of right wrist, not elsewhere classified: Secondary | ICD-10-CM

## 2020-11-07 DIAGNOSIS — M25521 Pain in right elbow: Secondary | ICD-10-CM | POA: Diagnosis not present

## 2020-11-07 NOTE — Therapy (Signed)
[facility] [address]  [place], [place], [address] Phone: [phone]   Fax:  [phone]  Physical Therapy Treatment  Patient Details  Name: Samantha Shepard MRN: [id number] Date of Birth: November 22, 1955 Referring Provider (PT): Billey Gosling, MD   Encounter Date: 11/07/2020   PT End of Session - 11/07/20 1016    Visit Number 10    Number of Visits 18    Date for PT Re-Evaluation 11/28/20    Authorization Type [employer]    Progress Note Due on Visit 17    PT Start Time 0928    PT Stop Time 1014    PT Time Calculation (min) 46 min    Activity Tolerance Patient tolerated treatment well    Behavior During Therapy [facility] for tasks assessed/performed           Past Medical History:  Diagnosis Date  . Allergy    dust, cats  . Anxiety   . Asthma    controlled  . Hyperlipidemia   . Hypertension   . Left breast mass   . Migraines     Past Surgical History:  Procedure Laterality Date  . BREAST EXCISIONAL BIOPSY Right 06/2019   benign  . BREAST LUMPECTOMY WITH RADIOACTIVE SEED LOCALIZATION Left 07/08/2019   Procedure: LEFT BREAST LUMPECTOMY WITH RADIOACTIVE SEED LOCALIZATION;  Surgeon: Jovita Kussmaul, MD;  Location: [place];  Service: General;  Laterality: Left;  . COLONOSCOPY  2009   neg; [facility]  . thyroid cyst aspirated    . TONSILLECTOMY      There were no vitals filed for this visit.   Subjective Assessment - 11/07/20 0929    Subjective Reports that she had been fine, until Friday when she noticed some muscle soreness in her shoulders. Had a rough day yesterday with the arm, but can tell "it's definitely improving." Reports that she played on her iPad for a total of 2 hours in shorts increments which still made her elbow sore.    Pertinent History migraines, HTN, HLD, asthma, anxiety, L breast lumpectomy    Diagnostic tests 08/29/20 B elbow xray: negative    Patient  Stated Goals get my arm to stop hurting    Currently in Pain? Yes    Pain Score 4     Pain Location Shoulder    Pain Orientation Right;Left    Pain Descriptors / Indicators Sore    Pain Type Acute pain                             [facility] Adult PT Treatment/Exercise - 11/07/20 0001      Shoulder Exercises: Seated   Horizontal ABduction Strengthening;Both;10 reps    Theraband Level (Shoulder Horizontal ABduction) Level 1 (Yellow);Level 2 (Red)    Horizontal ABduction Limitations in suoine; 10x yellow TB, 10x red TB    External Rotation Both;Theraband;10 reps    Theraband Level (Shoulder External Rotation) Level 1 (Yellow);Level 2 (Red)    External Rotation Limitations 10x yellow TB, 10x red TB   cues to avoid lumbar lordosis/over retraction of shoulders     Shoulder Exercises: Standing   Other Standing Exercises B serratus punches at wall 2x10   limited protraction   Other Standing Exercises B Y lift offs from wall x10   excessive lumbar lordosis; limited by LBP     Shoulder Exercises: ROM/Strengthening   UBE (Upper Arm Bike)  L2.0 3 min forward/3 min back      Manual Therapy   Manual Therapy Soft tissue mobilization;Myofascial release    Manual therapy comments sitting    Soft tissue mobilization STM to B wrist flexors, extensors, brachioradialis    Myofascial Release manual TPR to B wrist flexors/extensors   soft tissue restriction R>L, pain L>R                 PT Education - 11/07/20 1015    Education Details edu on using pronated vs. supinated arm when lifting and limiting iPad use to 30 minutes/day for improved pain    Person(s) Educated Patient    Methods Explanation;Demonstration;Tactile cues;Verbal cues    Comprehension Verbalized understanding            PT Short Term Goals - 10/17/20 1320      PT SHORT TERM GOAL #1   Title Patient to be independent with initial HEP.    Time 3    Period Weeks    Status Achieved    Target Date 09/27/20              PT Long Term Goals - 10/17/20 1320      PT LONG TERM GOAL #1   Title Patient to be independent with advanced HEP.    Time 6    Period Weeks    Status On-going   met for current   Target Date 11/28/20      PT LONG TERM GOAL #2   Title Patient to demonstrate B elbow and wrist strength >/=4+/5 and B grip strength atleast 25 lbs.    Time 6    Period Weeks    Status Partially Met   improvement in R elbow flexion/extension, R wrist flexion, L wrist extension, and B full handed grip   Target Date 11/28/20      PT LONG TERM GOAL #3   Title Patient to demonstrate B wrist and elbow AROM WFL and without pain limiting.    Time 6    Period Weeks    Status Partially Met   improved in R wrist flexion/extension, L wrist flexion, and R elbow extension   Target Date 11/28/20      PT LONG TERM GOAL #4   Title Patient to report tolerance for using her iPad for 1 hour without pain limiting.    Time 6    Period Weeks    Status On-going   attempted 1 hour, but with pain   Target Date 11/28/20      PT LONG TERM GOAL #5   Title Patient to report tolerance for cleaning activities around the house without pain limiting.    Time 6    Period Weeks    Status On-going   reports trying to dust for ~45 minutes with resulting pain   Target Date 11/28/20                 Plan - 11/07/20 1203    Clinical Impression Statement Patient arrived with report of improving B elbow pain, however with c/o shoulder pain today and over the weekend. Notes continued pain from using the iPad. Educated patient on DN, however for the time being she would like to hold off d/t good ability to manage pain currently. Worked on shoulder stability ther-ex with patient demonstrating tendency to over-retract scapulae resulting in lumbar lordosis and back pain. Corrected this positioning with supine positioning and cues to minimize retraction ROM. Ended session with MT to  B forearms d/t patient's c/o burning in  the L forearm. Patient presented with soft tissue restriction R>L and pain L>R in B wrist flexors and extensors. Reported improvement in pain levels at end of session.    Comorbidities migraines, HTN, HLD, asthma, anxiety, L breast lumpectomy    Rehab Potential Good    PT Frequency 2x / week    PT Duration 6 weeks    PT Treatment/Interventions ADLs/Self Care Home Management;Cryotherapy;Electrical Stimulation;Iontophoresis 20m/ml Dexamethasone;Moist Heat;Therapeutic exercise;Therapeutic activities;Functional mobility training;Ultrasound;Neuromuscular re-education;Patient/family education;Manual techniques;Vasopneumatic Device;Taping;Splinting;Energy conservation;Dry needling;Passive range of motion    PT Next Visit Plan Progress B shoulder, elbow, and wrist ROM and strength, periscapular strength and thoracic mobility; STM wrist flexors/extensors    PT Home Exercise Plan [id number]    Consulted and Agree with Plan of Care Patient           Patient will benefit from skilled therapeutic intervention in order to improve the following deficits and impairments:  Hypomobility,Increased edema,Decreased activity tolerance,Decreased strength,Increased fascial restricitons,Pain,Impaired UE functional use,Increased muscle spasms,Improper body mechanics,Decreased range of motion,Postural dysfunction,Impaired flexibility  Visit Diagnosis: Pain in right elbow  Pain in left elbow  Stiffness of left wrist, not elsewhere classified  Stiffness of right wrist, not elsewhere classified     Problem List Patient Active Problem List   Diagnosis Date Noted  . Moderate persistent asthma with acute exacerbation 08/31/2020  . Gastroesophageal reflux disease 08/31/2020  . Cough 08/31/2020  . Tendinitis 08/15/2020  . Sleep difficulties 07/29/2020  . Mild intermittent asthma without complication 178/67/6720 . History of epistaxis 07/14/2019  . Temporomandibular joint (TMJ) pain 06/16/2018  . Anxiety 06/16/2018   . Cervical adenopathy 06/16/2018  . 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 Acute sinus infection 10/04/2017  . Prediabetes 08/01/2017  . Tingling of both feet 04/18/2016  . Osteopenia 01/15/2016  . Mild persistent asthma 01/10/2016  . Other allergic rhinitis 01/10/2016  . Hyperlipidemia 08/13/2008  . Migraine headache 09/22/2007  . Uncontrolled hypertension 09/22/2007  . Allergic rhinitis 05/26/2007     YJanene Harvey PT, DPT 11/07/20 12:06 PM   [facility] [address] [place] [address]Phone: [phone]  Fax:  [phone] Name: Samantha ArseneauMRN: [id number] of Birth: [birth year]

## 2020-11-10 ENCOUNTER — Ambulatory Visit: Payer: Medicare PPO | Attending: Internal Medicine | Admitting: Physical Therapy

## 2020-11-10 ENCOUNTER — Encounter: Payer: Self-pay | Admitting: Physical Therapy

## 2020-11-10 ENCOUNTER — Other Ambulatory Visit: Payer: Self-pay

## 2020-11-10 DIAGNOSIS — M25521 Pain in right elbow: Secondary | ICD-10-CM | POA: Insufficient documentation

## 2020-11-10 DIAGNOSIS — M25632 Stiffness of left wrist, not elsewhere classified: Secondary | ICD-10-CM | POA: Diagnosis not present

## 2020-11-10 DIAGNOSIS — M25522 Pain in left elbow: Secondary | ICD-10-CM | POA: Insufficient documentation

## 2020-11-10 DIAGNOSIS — G8929 Other chronic pain: Secondary | ICD-10-CM | POA: Insufficient documentation

## 2020-11-10 DIAGNOSIS — M546 Pain in thoracic spine: Secondary | ICD-10-CM | POA: Diagnosis not present

## 2020-11-10 DIAGNOSIS — M545 Low back pain, unspecified: Secondary | ICD-10-CM | POA: Diagnosis not present

## 2020-11-10 DIAGNOSIS — M25631 Stiffness of right wrist, not elsewhere classified: Secondary | ICD-10-CM | POA: Insufficient documentation

## 2020-11-10 NOTE — Therapy (Signed)
[facility] [address]  [place], [place], [address] Phone: [phone]   Fax:  [phone]  Physical Therapy Treatment  Patient Details  Name: Samantha Shepard MRN: [id number] Date of Birth: Nov 24, 1955 Referring Provider (PT): Billey Gosling, MD   Encounter Date: 11/10/2020   PT End of Session - 11/10/20 1005    Visit Number 11    Number of Visits 18    Date for PT Re-Evaluation 11/28/20    Authorization Type [employer]    Progress Note Due on Visit 17    PT Start Time 0924    PT Stop Time 1005    PT Time Calculation (min) 41 min    Activity Tolerance Patient tolerated treatment well;Patient limited by pain    Behavior During Therapy [facility] for tasks assessed/performed           Past Medical History:  Diagnosis Date  . Allergy    dust, cats  . Anxiety   . Asthma    controlled  . Hyperlipidemia   . Hypertension   . Left breast mass   . Migraines     Past Surgical History:  Procedure Laterality Date  . BREAST EXCISIONAL BIOPSY Right 06/2019   benign  . BREAST LUMPECTOMY WITH RADIOACTIVE SEED LOCALIZATION Left 07/08/2019   Procedure: LEFT BREAST LUMPECTOMY WITH RADIOACTIVE SEED LOCALIZATION;  Surgeon: Jovita Kussmaul, MD;  Location: [facility];  Service: General;  Laterality: Left;  . COLONOSCOPY  2009   neg; [facility]  . thyroid cyst aspirated    . TONSILLECTOMY      There were no vitals filed for this visit.   Subjective Assessment - 11/10/20 0925    Subjective Feeling "wonderful" since last session. "The massage did the trick." Also has not played on her iPad.    Pertinent History migraines, HTN, HLD, asthma, anxiety, L breast lumpectomy    Diagnostic tests 08/29/20 B elbow xray: negative    Patient Stated Goals get my arm to stop hurting    Currently in Pain? Yes    Pain Score 1     Pain Location Arm    Pain Orientation Right    Pain Descriptors / Indicators Sore     Pain Type Acute pain                             [facility] Adult PT Treatment/Exercise - 11/10/20 0001      Shoulder Exercises: Seated   Other Seated Exercises B shoulder circles 5x forward, 5x back      Shoulder Exercises: Standing   Other Standing Exercises R and L prone I, Y 10x leaning over counter top   cueing to lean forward and depress shoulders     Shoulder Exercises: ROM/Strengthening   UBE (Upper Arm Bike) L2.0 3 min forward/3 min back      Shoulder Exercises: Stretch   Other Shoulder Stretches R/L LS stretch with strap assist 30"    Other Shoulder Stretches R/L UT stretch with strap assist 30"      Hand Exercises   Digiticizer R UE 10x red, B UE 2x10 green      Wrist Exercises   Wrist Flexion Strengthening;Right;Left;10 reps    Wrist Flexion Limitations with red TB and shoulder in neutral and elbow at side    Wrist Extension Strengthening;Right;Left;10 reps;Seated    Wrist Extension Limitations with red TB and shoulder  in neutral and elbow at side   good tolerance in wrists, noted mild L cervical pain   Wrist Radial Deviation Strengthening;Right;Left;10 reps;Standing    Wrist Radial Deviation Limitations mid and 3/4 grip on dowel rod    Wrist Ulnar Deviation Strengthening;Right;Left;10 reps;Standing    Wrist Ulnar Deviation Limitations mid and 3/4 grip on dowel rod                    PT Short Term Goals - 10/17/20 1320      PT SHORT TERM GOAL #1   Title Patient to be independent with initial HEP.    Time 3    Period Weeks    Status Achieved    Target Date 09/27/20             PT Long Term Goals - 10/17/20 1320      PT LONG TERM GOAL #1   Title Patient to be independent with advanced HEP.    Time 6    Period Weeks    Status On-going   met for current   Target Date 11/28/20      PT LONG TERM GOAL #2   Title Patient to demonstrate B elbow and wrist strength >/=4+/5 and B grip strength atleast 25 lbs.    Time 6    Period  Weeks    Status Partially Met   improvement in R elbow flexion/extension, R wrist flexion, L wrist extension, and B full handed grip   Target Date 11/28/20      PT LONG TERM GOAL #3   Title Patient to demonstrate B wrist and elbow AROM WFL and without pain limiting.    Time 6    Period Weeks    Status Partially Met   improved in R wrist flexion/extension, L wrist flexion, and R elbow extension   Target Date 11/28/20      PT LONG TERM GOAL #4   Title Patient to report tolerance for using her iPad for 1 hour without pain limiting.    Time 6    Period Weeks    Status On-going   attempted 1 hour, but with pain   Target Date 11/28/20      PT LONG TERM GOAL #5   Title Patient to report tolerance for cleaning activities around the house without pain limiting.    Time 6    Period Weeks    Status On-going   reports trying to dust for ~45 minutes with resulting pain   Target Date 11/28/20                 Plan - 11/10/20 1006    Clinical Impression Statement Patient reporting good improvement in pain levels since MT last session. Also reports decreased activity on her iPad, which may also have contributed to low pain levels. Worked on progressive grip and wrist strengthening today. Patient was able to tolerate these exercises without pain and with good muscle control. Did demonstrate B shoulder hiking which resulted in upper shoulder/cervical soreness. Progressed shoulder and periscapular strengthening with continued cueing to depress shoulders. Ended session with gentle cervical stretching to address pain and tension. Patient reported good benefit from stretching and without complaints at end of session.    Comorbidities migraines, HTN, HLD, asthma, anxiety, L breast lumpectomy    Rehab Potential Good    PT Frequency 2x / week    PT Duration 6 weeks    PT Treatment/Interventions ADLs/Self Care Home Management;Cryotherapy;Electrical Stimulation;Iontophoresis 26m/ml Dexamethasone;Moist  Heat;Therapeutic exercise;Therapeutic activities;Functional mobility training;Ultrasound;Neuromuscular re-education;Patient/family education;Manual techniques;Vasopneumatic Device;Taping;Splinting;Energy conservation;Dry needling;Passive range of motion    PT Next Visit Plan Progress B shoulder, elbow, and wrist ROM and strength, periscapular strength and thoracic mobility; STM wrist flexors/extensors    PT Home Exercise Plan [id number]    Consulted and Agree with Plan of Care Patient           Patient will benefit from skilled therapeutic intervention in order to improve the following deficits and impairments:  Hypomobility,Increased edema,Decreased activity tolerance,Decreased strength,Increased fascial restricitons,Pain,Impaired UE functional use,Increased muscle spasms,Improper body mechanics,Decreased range of motion,Postural dysfunction,Impaired flexibility  Visit Diagnosis: Pain in right elbow  Pain in left elbow  Stiffness of left wrist, not elsewhere classified  Stiffness of right wrist, not elsewhere classified     Problem List Patient Active Problem List   Diagnosis Date Noted  . Moderate persistent asthma with acute exacerbation 08/31/2020  . Gastroesophageal reflux disease 08/31/2020  . Cough 08/31/2020  . Tendinitis 08/15/2020  . Sleep difficulties 07/29/2020  . Mild intermittent asthma without complication 37/62/8315  . History of epistaxis 07/14/2019  . Temporomandibular joint (TMJ) pain 06/16/2018  . Anxiety 06/16/2018  . Cervical adenopathy 06/16/2018  . 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 Acute sinus infection 10/04/2017  . Prediabetes 08/01/2017  . Tingling of both feet 04/18/2016  . Osteopenia 01/15/2016  . Mild persistent asthma 01/10/2016  . Other allergic rhinitis 01/10/2016  . Hyperlipidemia 08/13/2008  . Migraine headache 09/22/2007  . Uncontrolled hypertension 09/22/2007  . Allergic rhinitis 05/26/2007     Janene Harvey, PT, DPT 11/10/20 10:07  AM   [facility] [address]  [place], [place], [address] Phone: [phone]   Fax:  [phone]  Name: Janeliz Prestwood MRN: [id number] Date of Birth: 1956-07-03

## 2020-11-14 ENCOUNTER — Ambulatory Visit: Payer: Medicare PPO | Admitting: Physical Therapy

## 2020-11-16 NOTE — Progress Notes (Signed)
Subjective:    Patient ID: Samantha Shepard, female    DOB: 18-Aug-1956, 65 y.o.   MRN: [id number]  HPI The patient is here for an acute visit.   back pain - she is having upper back and middle back pain.  It started over two months ago.  Increased pain with movement.  She sometimes has N/T in her arms - it is mild.  She has some pain her arms.  Tylenol helps a little.  She tried a Production assistant, radio and it helped so-so.    No injuries or obvious cause.    Medications and allergies reviewed with patient and updated if appropriate.  Patient Active Problem List   Diagnosis Date Noted  . Moderate persistent asthma with acute exacerbation 08/31/2020  . Gastroesophageal reflux disease 08/31/2020  . Cough 08/31/2020  . Tendinitis 08/15/2020  . Sleep difficulties 07/29/2020  . Mild intermittent asthma without complication 36/64/4034  . History of epistaxis 07/14/2019  . Temporomandibular joint (TMJ) pain 06/16/2018  . Anxiety 06/16/2018  . Cervical adenopathy 06/16/2018  . 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 Acute sinus infection 10/04/2017  . Prediabetes 08/01/2017  . Tingling of both feet 04/18/2016  . Osteopenia 01/15/2016  . Mild persistent asthma 01/10/2016  . Other allergic rhinitis 01/10/2016  . Hyperlipidemia 08/13/2008  . Migraine headache 09/22/2007  . Uncontrolled hypertension 09/22/2007  . Allergic rhinitis 05/26/2007    Current Outpatient Medications on File Prior to Visit  Medication Sig Dispense Refill  . albuterol (VENTOLIN HFA) 108 (90 Base) MCG/ACT inhaler INHALE 2 PUFFS INTO THE LUNGS EVERY 4 (FOUR) HOURS AS NEEDED FOR WHEEZING OR SHORTNESS OF BREATH. 18 g 1  . amLODipine (NORVASC) 5 MG tablet Take 1 tablet (5 mg total) by mouth daily. 90 tablet 1  . cholecalciferol (VITAMIN D3) 25 MCG (1000 UT) tablet Take 1,000 Units by mouth daily.    . ciclesonide (ALVESCO) 80 MCG/ACT inhaler 2 puffs twice daily with spacer to prevent coughing or wheezing. 2 each 3  . clonazePAM  (KLONOPIN) 0.5 MG tablet clonazepam 0.5 mg tablet  TAKE 1 TABLET BY MOUTH AT BEDTIME AS NEEDED FOR ANXIETY OR TMJ    . estradiol-norethindrone (ACTIVELLA) 1-0.5 MG tablet Take 1 mg by mouth every other day.    . gabapentin (NEURONTIN) 100 MG capsule TAKE 1-3 CAPSULES (100-300 MG TOTAL) BY MOUTH AT BEDTIME. 90 capsule 3  . losartan-hydrochlorothiazide (HYZAAR) 100-25 MG tablet TAKE 1 TABLET BY MOUTH DAILY 90 tablet 3  . magnesium 30 MG tablet Take 30 mg by mouth 2 (two) times daily.    . montelukast (SINGULAIR) 10 MG tablet Take 1 tablet once a day when you are in the mountains 90 tablet 1  . Multiple Vitamin (MULTIVITAMIN WITH MINERALS) TABS tablet Take 1 tablet by mouth daily.    . nitroGLYCERIN (NITRODUR - DOSED IN MG/24 HR) 0.2 mg/hr patch Apply 1/4 patch daily to tendon for tendonitis. 30 patch 1  . traZODone (DESYREL) 50 MG tablet      No current facility-administered medications on file prior to visit.    Past Medical History:  Diagnosis Date  . Allergy    dust, cats  . Anxiety   . Asthma    controlled  . Hyperlipidemia   . Hypertension   . Left breast mass   . Migraines     Past Surgical History:  Procedure Laterality Date  . BREAST EXCISIONAL BIOPSY Right 06/2019   benign  . BREAST LUMPECTOMY WITH RADIOACTIVE SEED LOCALIZATION Left 07/08/2019  Procedure: LEFT BREAST LUMPECTOMY WITH RADIOACTIVE SEED LOCALIZATION;  Surgeon: Jovita Kussmaul, MD;  Location: [facility];  Service: General;  Laterality: Left;  . COLONOSCOPY  2009   neg; [facility]  . thyroid cyst aspirated    . TONSILLECTOMY      Social History   Socioeconomic History  . Marital status: Married    Spouse name: Not on file  . Number of children: 0  . Years of education: 56  . Highest education level: Not on file  Occupational History  . Occupation: retired  Tobacco Use  . Smoking status: Never Smoker  . Smokeless tobacco: Never Used  Vaping Use  . Vaping Use: Never used  Substance  and Sexual Activity  . Alcohol use: Yes    Alcohol/week: 7.0 standard drinks    Types: 7 Glasses of wine per week    Comment: wine   . Drug use: No  . Sexual activity: Not on file  Other Topics Concern  . Not on file  Social History Narrative   Exercise: regular      Right handed      One story home   Social Determinants of Health   Financial Resource Strain: Not on file  Food Insecurity: Not on file  Transportation Needs: Not on file  Physical Activity: Not on file  Stress: Not on file  Social Connections: Not on file    Family History  Problem Relation Age of Onset  . Heart disease Mother        CABG 3 vessel @ 50  . Hypertension Mother   . Hyperlipidemia Mother   . Diabetes Mother   . Stroke Mother 44  . Prostate cancer Father   . Hypertension Father   . Breast cancer Sister   . Hyperlipidemia Sister   . Allergic rhinitis Neg Hx   . Asthma Neg Hx   . Angioedema Neg Hx   . Eczema Neg Hx   . Immunodeficiency Neg Hx   . Urticaria Neg Hx     Review of Systems     Objective:  There were no vitals filed for this visit. BP Readings from Last 3 Encounters:  09/28/20 112/68  08/31/20 126/70  08/29/20 120/66   Wt Readings from Last 3 Encounters:  09/28/20 155 lb 6.4 oz (70.5 kg)  08/31/20 155 lb (70.3 kg)  08/29/20 156 lb 9.6 oz (71 kg)   There is no height or weight on file to calculate BMI.   Physical Exam Constitutional:      General: She is not in acute distress.    Appearance: Normal appearance. She is not ill-appearing.  HENT:     Head: Normocephalic and atraumatic.  Musculoskeletal:        General: Tenderness (trapezius b/l - mild to palpation, no c-spine or t-spine tenderness) present. No deformity.  Skin:    General: Skin is warm and dry.  Neurological:     Mental Status: She is alert.     Sensory: No sensory deficit.     Motor: No weakness.            Assessment & Plan:    See Problem List for Assessment and Plan of chronic  medical problems.    This visit occurred during the SARS-CoV-2 public health emergency.  Safety protocols were in place, including screening questions prior to the visit, additional usage of staff PPE, and extensive cleaning of exam room while observing appropriate contact time as indicated for disinfecting  solutions.

## 2020-11-17 ENCOUNTER — Encounter: Payer: Self-pay | Admitting: Internal Medicine

## 2020-11-17 ENCOUNTER — Encounter: Payer: Self-pay | Admitting: Physical Therapy

## 2020-11-17 ENCOUNTER — Ambulatory Visit (INDEPENDENT_AMBULATORY_CARE_PROVIDER_SITE_OTHER): Payer: Medicare PPO | Admitting: Internal Medicine

## 2020-11-17 ENCOUNTER — Ambulatory Visit: Payer: Medicare PPO | Admitting: Physical Therapy

## 2020-11-17 ENCOUNTER — Other Ambulatory Visit: Payer: Self-pay

## 2020-11-17 DIAGNOSIS — M546 Pain in thoracic spine: Secondary | ICD-10-CM | POA: Diagnosis not present

## 2020-11-17 DIAGNOSIS — M25521 Pain in right elbow: Secondary | ICD-10-CM | POA: Diagnosis not present

## 2020-11-17 DIAGNOSIS — M549 Dorsalgia, unspecified: Secondary | ICD-10-CM | POA: Diagnosis not present

## 2020-11-17 DIAGNOSIS — M545 Low back pain, unspecified: Secondary | ICD-10-CM | POA: Diagnosis not present

## 2020-11-17 DIAGNOSIS — G8929 Other chronic pain: Secondary | ICD-10-CM | POA: Diagnosis not present

## 2020-11-17 DIAGNOSIS — M25631 Stiffness of right wrist, not elsewhere classified: Secondary | ICD-10-CM

## 2020-11-17 DIAGNOSIS — M25632 Stiffness of left wrist, not elsewhere classified: Secondary | ICD-10-CM | POA: Diagnosis not present

## 2020-11-17 DIAGNOSIS — M25522 Pain in left elbow: Secondary | ICD-10-CM | POA: Diagnosis not present

## 2020-11-17 MED ORDER — CYCLOBENZAPRINE HCL 5 MG PO TABS: 5.0000 mg | ORAL_TABLET | Freq: Every evening | ORAL | 0 refills | Status: DC | PRN

## 2020-11-17 NOTE — Assessment & Plan Note (Signed)
Acute Upper back pain x 2 months - sounds msk in nature ? True radiculopathy - I doubt she does Will try PT - referred  Will try flexeril 5 mg at bedtime to see if that helps Follow up if no improvement

## 2020-11-17 NOTE — Therapy (Signed)
[facility] [address]  [place], [place], [address] Phone: [phone]   Fax:  [phone]  Physical Therapy Treatment  Patient Details  Name: Samantha Shepard MRN: [id number] Date of Birth: 1956-01-15 Referring Provider (PT): Billey Gosling, MD   Encounter Date: 11/17/2020   PT End of Session - 11/17/20 1004    Visit Number 12    Number of Visits 18    Date for PT Re-Evaluation 11/28/20    Authorization Type [employer]    Progress Note Due on Visit 22    PT Start Time 0921    PT Stop Time 1003    PT Time Calculation (min) 42 min    Activity Tolerance Patient tolerated treatment well    Behavior During Therapy [facility] for tasks assessed/performed           Past Medical History:  Diagnosis Date  . Allergy    dust, cats  . Anxiety   . Asthma    controlled  . Hyperlipidemia   . Hypertension   . Left breast mass   . Migraines     Past Surgical History:  Procedure Laterality Date  . BREAST EXCISIONAL BIOPSY Right 06/2019   benign  . BREAST LUMPECTOMY WITH RADIOACTIVE SEED LOCALIZATION Left 07/08/2019   Procedure: LEFT BREAST LUMPECTOMY WITH RADIOACTIVE SEED LOCALIZATION;  Surgeon: Jovita Kussmaul, MD;  Location: [facility];  Service: General;  Laterality: Left;  . COLONOSCOPY  2009   neg;  [facility]  . thyroid cyst aspirated    . TONSILLECTOMY      There were no vitals filed for this visit.   Subjective Assessment - 11/17/20 0920    Subjective Has an MD appointment for her back today but it is feeling better today. Has stopped walking in the house for exercise and attributes her decreased pain to this. Reports 75% improvement in pain levels. Reports that she played on the iPAd for 15-20 minutes without residual pain. Notes tolerance for 30-40 minutes of cleaning before onset of pain.    Pertinent History migraines, HTN, HLD, asthma, anxiety, L breast lumpectomy    Diagnostic  tests 08/29/20 B elbow xray: negative    Patient Stated Goals get my arm to stop hurting    Currently in Pain? Yes    Pain Score 3     Pain Location Elbow    Pain Orientation Right;Anterior    Pain Descriptors / Indicators Sharp    Pain Type Chronic pain              [facility] PT Assessment - 11/17/20 0001      AROM   Right Elbow Flexion 153    Right Wrist Extension 72 Degrees    Right Wrist Flexion 80 Degrees    Right Wrist Radial Deviation 27 Degrees    Right Wrist Ulnar Deviation 28 Degrees    Left Wrist Extension 70 Degrees    Left Wrist Flexion 73 Degrees    Left Wrist Radial Deviation 22 Degrees    Left Wrist Ulnar Deviation 30 Degrees      Strength   Right Shoulder Flexion 4+/5    Right Shoulder ABduction 4+/5    Right Shoulder Internal Rotation 4+/5    Right Shoulder External Rotation 4+/5    Left Shoulder Flexion 4+/5    Left Shoulder ABduction 4+/5    Left Shoulder Internal Rotation 4+/5    Left Shoulder External Rotation 4+/5  Right Forearm Pronation 4+/5    Right Forearm Supination 4+/5    Left Forearm Pronation 4+/5    Left Forearm Supination 4+/5    Right Wrist Flexion 4+/5    Right Wrist Extension 4/5    Left Wrist Flexion 4+/5    Left Wrist Extension 4+/5    Right Hand Grip (lbs) 19   24, 15, 18; c/o shoulder discomfort   Left Hand Grip (lbs) 17   18, 18, 15; c/o shoulder discomfort                        [facility] Adult PT Treatment/Exercise - 11/17/20 0001      Shoulder Exercises: Standing   External Rotation Strengthening;Right;Left;10 reps;Theraband    Theraband Level (Shoulder External Rotation) Level 2 (Red)    External Rotation Limitations cues to maintain wrist neutral    Extension Strengthening;Both;10 reps;Theraband    Theraband Level (Shoulder Extension) Level 2 (Red);Level 1 (Yellow)    Extension Limitations 10x yellow, 10x red   cues for posterior pelvic tilt   Row Strengthening;Both;10 reps;Theraband    Theraband Level  (Shoulder Row) Level 3 (Green)    Row Limitations 2x10   cues for posterior pelvic tilt to correct lordosis     Shoulder Exercises: ROM/Strengthening   UBE (Upper Arm Bike) L2.0 3 min forward/3 min back      Shoulder Exercises: Stretch   Corner Stretch 2 reps;30 seconds    Corner Stretch Limitations to tolerance; cues for neutral neck      Wrist Exercises   Other wrist exercises B wrist flexion/extension stretch against wall 2x30" each                  PT Education - 11/17/20 1004    Education Details discussion on objective progress and remaining impairments    Person(s) Educated Patient    Methods Explanation    Comprehension Verbalized understanding            PT Short Term Goals - 10/17/20 1320      PT SHORT TERM GOAL #1   Title Patient to be independent with initial HEP.    Time 3    Period Weeks    Status Achieved    Target Date 09/27/20             PT Long Term Goals - 11/17/20 1005      PT LONG TERM GOAL #1   Title Patient to be independent with advanced HEP.    Time 6    Period Weeks    Status On-going   met for current     PT LONG TERM GOAL #2   Title Patient to demonstrate B elbow and wrist strength >/=4+/5 and B grip strength atleast 25 lbs.    Time 6    Period Weeks    Status Partially Met   still limited in R wrist extension and B grip     PT LONG TERM GOAL #3   Title Patient to demonstrate B wrist and elbow AROM WFL and without pain limiting.    Time 6    Period Weeks    Status Partially Met   improved to nearly WNL     PT LONG TERM GOAL #4   Title Patient to report tolerance for using her iPad for 1 hour without pain limiting.    Time 6    Period Weeks    Status On-going   tolerates 15-20 min  PT LONG TERM GOAL #5   Title Patient to report tolerance for cleaning activities around the house without pain limiting.    Time 6    Period Weeks    Status On-going   tolerated 30-40 min                Plan - 11/17/20  1009    Clinical Impression Statement Patient arrived to session with report of 75% improvement since initial eval. Reports tolerance for 30-40 minutes of cleaning and 15-20 minutes on iPad before onset of pain. Strength testing revealed improvement in B shoulder strength, but still with deficits in R wrist extension and B grip strength. B wrist flexion and extension AROM has improved to nearly WNL and with slight improvements in R radial deviation and L ulnar deviation AROM. Cues provided with periscapular strengthening to encourage posterior pelvic tilt as patient demonstrated excessive lumbar lordosis and required cues to maintain neutral wrist with shoulder rotation strengthening. Noted some chest tightness after ther-ex which was relieved with pec stretching. Patient without complaints at end of session> patient is demonstrating good progress towards goals.    Comorbidities migraines, HTN, HLD, asthma, anxiety, L breast lumpectomy    Rehab Potential Good    PT Frequency 2x / week    PT Duration 6 weeks    PT Treatment/Interventions ADLs/Self Care Home Management;Cryotherapy;Electrical Stimulation;Iontophoresis 72m/ml Dexamethasone;Moist Heat;Therapeutic exercise;Therapeutic activities;Functional mobility training;Ultrasound;Neuromuscular re-education;Patient/family education;Manual techniques;Vasopneumatic Device;Taping;Splinting;Energy conservation;Dry needling;Passive range of motion    PT Next Visit Plan Progress B shoulder, elbow, and wrist ROM and strength, periscapular strength and thoracic mobility; STM wrist flexors/extensors    PT Home Exercise Plan [id number]    Consulted and Agree with Plan of Care Patient           Patient will benefit from skilled therapeutic intervention in order to improve the following deficits and impairments:  Hypomobility,Increased edema,Decreased activity tolerance,Decreased strength,Increased fascial restricitons,Pain,Impaired UE functional use,Increased muscle  spasms,Improper body mechanics,Decreased range of motion,Postural dysfunction,Impaired flexibility  Visit Diagnosis: Pain in right elbow  Pain in left elbow  Stiffness of left wrist, not elsewhere classified  Stiffness of right wrist, not elsewhere classified     Problem List Patient Active Problem List   Diagnosis Date Noted  . Moderate persistent asthma with acute exacerbation 08/31/2020  . Gastroesophageal reflux disease 08/31/2020  . Cough 08/31/2020  . Tendinitis 08/15/2020  . Sleep difficulties 07/29/2020  . Mild intermittent asthma without complication 112/24/4975 . History of epistaxis 07/14/2019  . Temporomandibular joint (TMJ) pain 06/16/2018  . Anxiety 06/16/2018  . Cervical adenopathy 06/16/2018  . 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 Acute sinus infection 10/04/2017  . Prediabetes 08/01/2017  . Tingling of both feet 04/18/2016  . Osteopenia 01/15/2016  . Mild persistent asthma 01/10/2016  . Other allergic rhinitis 01/10/2016  . Hyperlipidemia 08/13/2008  . Migraine headache 09/22/2007  . Uncontrolled hypertension 09/22/2007  . Allergic rhinitis 05/26/2007     YJanene Harvey PT, DPT 11/17/20 10:10 AM   [facility] [place] [address]Phone: [phone]  Fax:  [phone] Name: PLynleigh KovackMRN: [id number] of Birth: 910/23/1957

## 2020-11-17 NOTE — Patient Instructions (Addendum)
    Medications changes include :   Flexeril 5 mg at bedtime for your muscle tightness   Your prescription(s) have been submitted to your pharmacy. Please take as directed and contact our office if you believe you are having problem(s) with the medication(s).   A referral was ordered for physical therapy.     Someone from their office will call you to schedule an appointment.

## 2020-11-21 ENCOUNTER — Encounter: Payer: Self-pay | Admitting: Physical Therapy

## 2020-11-21 ENCOUNTER — Other Ambulatory Visit: Payer: Self-pay

## 2020-11-21 ENCOUNTER — Ambulatory Visit: Payer: Medicare PPO | Admitting: Physical Therapy

## 2020-11-21 DIAGNOSIS — M546 Pain in thoracic spine: Secondary | ICD-10-CM | POA: Diagnosis not present

## 2020-11-21 DIAGNOSIS — M25522 Pain in left elbow: Secondary | ICD-10-CM

## 2020-11-21 DIAGNOSIS — M25632 Stiffness of left wrist, not elsewhere classified: Secondary | ICD-10-CM | POA: Diagnosis not present

## 2020-11-21 DIAGNOSIS — M25631 Stiffness of right wrist, not elsewhere classified: Secondary | ICD-10-CM

## 2020-11-21 DIAGNOSIS — M545 Low back pain, unspecified: Secondary | ICD-10-CM | POA: Diagnosis not present

## 2020-11-21 DIAGNOSIS — M25521 Pain in right elbow: Secondary | ICD-10-CM | POA: Diagnosis not present

## 2020-11-21 DIAGNOSIS — G8929 Other chronic pain: Secondary | ICD-10-CM

## 2020-11-21 NOTE — Therapy (Signed)
[facility] [address]  [place], [place], [address] Phone: [phone]   Fax:  [phone]  Physical Therapy Re-Evaluation  Patient Details  Name: Samantha Shepard MRN: [id number] Date of Birth: [birth year] Referring Provider (PT): Billey Gosling, MD   Progress Note Reporting Period 11/21/20 to 11/21/20  See note below for Objective Data and Assessment of Progress/Goals.     Encounter Date: 11/21/2020   PT End of Session - 11/21/20 0929    Visit Number 13    Number of Visits 25    Date for PT Re-Evaluation 01/02/21    Authorization Type [employer]    Progress Note Due on Visit 90    PT Start Time 0850    PT Stop Time 0926    PT Time Calculation (min) 36 min    Activity Tolerance Patient tolerated treatment well    Behavior During Therapy [facility] for tasks assessed/performed           Past Medical History:  Diagnosis Date  . Allergy    dust, cats  . Anxiety   . Asthma    controlled  . Hyperlipidemia   . Hypertension   . Left breast mass   . Migraines     Past Surgical History:  Procedure Laterality Date  . BREAST EXCISIONAL BIOPSY Right 06/2019   benign  . BREAST LUMPECTOMY WITH RADIOACTIVE SEED LOCALIZATION Left 07/08/2019   Procedure: LEFT BREAST LUMPECTOMY WITH RADIOACTIVE SEED LOCALIZATION;  Surgeon: Jovita Kussmaul, MD;  Location: [facility];  Service: General;  Laterality: Left;  . COLONOSCOPY  2009   neg; [facility]  . thyroid cyst aspirated    . TONSILLECTOMY      There were no vitals filed for this visit.   Subjective Assessment - 11/21/20 0853    Subjective Patient reports LBP and midback pain for the past 2 months without known cause. Notes intermittent N/T in B arms to elbows, intermittently to hands. Denies radiation of pain or B&B changes. LBP is located across B sides. Midback pain occurs along the midline. Worse when laying on the couch, prolonged  sitting, sitting with feet unsupported, prolonged walking on concrete. Better with stretching.    Pertinent History migraines, HTN, HLD, asthma, anxiety, L breast lumpectomy    Limitations Sitting;Lifting;Standing;Walking;House hold activities    How long can you sit comfortably? 15-30 min    How long can you stand comfortably? 10 min    How long can you walk comfortably? 30 min    Diagnostic tests 08/29/20 B elbow xray: negative    Patient Stated Goals get my arm to stop hurting    Currently in Pain? Yes    Multiple Pain Sites Yes    Pain Score 0    Pain Location Back    Pain Orientation Lower    Pain Descriptors / Indicators Aching    Pain Score 4    Pain Location Back    Pain Orientation Mid    Pain Descriptors / Indicators Aching    Pain Type Acute pain              [facility] PT Assessment - 11/21/20 0859      Assessment   Medical Diagnosis Upper back pain    Referring Provider (PT) Billey Gosling, MD    Onset Date/Surgical Date 09/20/20    Next MD Visit check up in April      Precautions   Precautions None  Home Environment   Living Environment Private residence    Living Arrangements Spouse/significant other    Available Help at Discharge Family    Type of Home House    Home Access Stairs to enter    Entrance Stairs-Number of Steps 1    Entrance Stairs-Rails None    Home Layout Two level    Alternate Level Stairs-Number of Steps pt unsure    Alternate Level Stairs-Rails Right;Left    Home Equipment None      Posture/Postural Control   Posture/Postural Control Postural limitations    Postural Limitations Forward head;Increased lumbar lordosis    Posture Comments --      AROM   AROM Assessment Site Lumbar;Thoracic    Lumbar Flexion ankles    Lumbar Extension WFL    Lumbar - Right Side Bend mid thigh    Lumbar - Left Side Bend distal thigh    Lumbar - Right Rotation moderately limited   discomfort   Lumbar - Left Rotation moderately limited   discomfort    Thoracic Flexion WFL    Thoracic Extension moderately limited   discomfort   Thoracic - Right Side Bend [facility]    Thoracic - Left Side Bend [facility]    Thoracic - Right Rotation mildly limited   discomfort   Thoracic - Left Rotation mildly limited   discomfort     Strength   Strength Assessment Site Hip;Knee;Ankle    Right/Left Hip Right;Left    Right Hip Flexion 5/5    Right Hip ABduction 4+/5    Right Hip ADduction 4+/5    Left Hip Flexion 4+/5    Left Hip ABduction 4+/5    Left Hip ADduction 4+/5    Right/Left Knee Right;Left    Right Knee Flexion 5/5    Right Knee Extension 5/5    Left Knee Flexion 5/5    Left Knee Extension 5/5    Right/Left Ankle Right;Left    Right Ankle Dorsiflexion 5/5    Right Ankle Plantar Flexion 4+/5    Left Ankle Dorsiflexion 5/5    Left Ankle Plantar Flexion 4+/5      Flexibility   Soft Tissue Assessment /Muscle Length yes    Hamstrings B WFL    Quadriceps B severely tight in mod thomas    Piriformis B mildly tight in figure 4 and KTOS      Palpation   Palpation comment significant soft tissue restriction in B lumbar paraspinals, proximal glutes, piriformis, thoracic paraspinals, rhomboids, QL with intermittent TTP throughout                                 PT Education - 11/21/20 0928    Education Details prognosis, POC, HEP    Person(s) Educated Patient    Methods Explanation;Demonstration;Tactile cues;Verbal cues;Handout    Comprehension Verbalized understanding;Returned demonstration            PT Short Term Goals - 11/21/20 1236      PT SHORT TERM GOAL #1   Title Patient to be independent with initial HEP.    Time 3    Period Weeks    Status Achieved    Target Date 09/27/20             PT Long Term Goals - 11/21/20 1236      PT LONG TERM GOAL #1   Title Patient to be independent with advanced HEP.    Time  6    Period Weeks    Status On-going   met for current   Target Date 01/02/21      PT LONG  TERM GOAL #2   Title Patient to demonstrate B elbow and wrist strength >/=4+/5 and B grip strength atleast 25 lbs.    Time 6    Period Weeks    Status Partially Met   still limited in R wrist extension and B grip   Target Date 01/02/21      PT LONG TERM GOAL #3   Title Patient to demonstrate B wrist and elbow AROM WFL and without pain limiting.    Time 6    Period Weeks    Status Partially Met   improved to nearly WNL   Target Date 01/02/21      PT LONG TERM GOAL #4   Title Patient to report tolerance for using her iPad for 1 hour without pain limiting.    Time 6    Period Weeks    Status On-going   tolerates 15-20 min   Target Date 01/02/21      PT LONG TERM GOAL #5   Title Patient to report tolerance for cleaning activities around the house without pain limiting.    Time 6    Period Weeks    Status On-going   tolerated 30-40 min   Target Date 01/02/21      Additional Long Term Goals   Additional Long Term Goals Yes      PT LONG TERM GOAL #6   Title Patient to demonstrate thoracic and lumbar AROM WFL and without pain limiting.    Time 6    Period Weeks    Status New    Target Date 01/02/21      PT LONG TERM GOAL #7   Title Patient to report tolerance of 2 hours on her feet without pain limiting.    Time 6    Period Weeks    Status New    Target Date 01/02/21      PT LONG TERM GOAL #8   Title Patient to demonstrate self-correction of posture at rest and with activity for continued management of LBP.    Time 6    Period Weeks    Status New    Target Date 01/02/21                 Plan - 11/21/20 1231    Clinical Impression Statement Patient returns to [facility] for assessment of acute thoracic and lumbar pain of 2 months duration without inciting injury. Pain occurs across B LB and along the midline of the thoracic spine. Worse when laying on the couch, prolonged sitting, sitting with feet unsupported, prolonged walking on concrete. Reports intermittent N/T in B  arms to elbows, intermittently to hands but denies radiation or B&B changes. Patient today presenting with forward head posture and increased lumbar lordosis, limited and painful thoracic and lumbar AROM, good LE strength, tightness in B hip flexors and piriformis, and soft tissue restriction throughout the posterior chain. Patient was educated on gentle stretching HEP- patient reported understanding. Would benefit from skilled PT services 2x/week for 6 weeks to address aforementioned impairments and current goals.    Personal Factors and Comorbidities Age;Comorbidity 3+;Fitness;Past/Current Experience;Time since onset of injury/illness/exacerbation    Comorbidities migraines, HTN, HLD, asthma, anxiety, L breast lumpectomy    Stability/Clinical Decision Making Stable/Uncomplicated    Clinical Decision Making Low    Rehab Potential Good  PT Frequency 2x / week    PT Duration 6 weeks    PT Treatment/Interventions ADLs/Self Care Home Management;Cryotherapy;Electrical Stimulation;Iontophoresis 36m/ml Dexamethasone;Moist Heat;Therapeutic exercise;Therapeutic activities;Functional mobility training;Ultrasound;Neuromuscular re-education;Patient/family education;Manual techniques;Vasopneumatic Device;Taping;Splinting;Energy conservation;Dry needling;Passive range of motion;Traction;Stair training;Gait training    PT Next Visit Plan reassess LE HEP; Progress B shoulder, elbow, and wrist ROM and strength, periscapular strength and thoracic mobility; STM wrist flexors/extensors    PT Home Exercise Plan [id number]    Consulted and Agree with Plan of Care Patient           Patient will benefit from skilled therapeutic intervention in order to improve the following deficits and impairments:  Hypomobility,Increased edema,Decreased activity tolerance,Decreased strength,Increased fascial restricitons,Pain,Impaired UE functional use,Increased muscle spasms,Improper body mechanics,Decreased range of motion,Postural  dysfunction,Impaired flexibility,Difficulty walking  Visit Diagnosis: Pain in right elbow  Pain in left elbow  Stiffness of left wrist, not elsewhere classified  Stiffness of right wrist, not elsewhere classified  Chronic bilateral low back pain without sciatica  Pain in thoracic spine     Problem List Patient Active Problem List   Diagnosis Date Noted  . Upper back pain 11/17/2020  . Moderate persistent asthma with acute exacerbation 08/31/2020  . Gastroesophageal reflux disease 08/31/2020  . Cough 08/31/2020  . Tendinitis 08/15/2020  . Sleep difficulties 07/29/2020  . Mild intermittent asthma without complication 145/62/5638 . History of epistaxis 07/14/2019  . Temporomandibular joint (TMJ) pain 06/16/2018  . Anxiety 06/16/2018  . Cervical adenopathy 06/16/2018  . 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 Acute sinus infection 10/04/2017  . Prediabetes 08/01/2017  . Tingling of both feet 04/18/2016  . Osteopenia 01/15/2016  . Mild persistent asthma 01/10/2016  . Other allergic rhinitis 01/10/2016  . Hyperlipidemia 08/13/2008  . Migraine headache 09/22/2007  . Uncontrolled hypertension 09/22/2007  . Allergic rhinitis 05/26/2007     YJanene Harvey PT, DPT 11/21/20 12:41 PM   [facility] [place] [address]Phone: [phone]  Fax:  [phone] Name: PNastasha ReisingMRN: [id number] of Birth: [birth year]

## 2020-11-24 ENCOUNTER — Ambulatory Visit: Payer: Medicare PPO | Admitting: Physical Therapy

## 2020-11-24 ENCOUNTER — Encounter: Payer: Self-pay | Admitting: Physical Therapy

## 2020-11-24 ENCOUNTER — Other Ambulatory Visit: Payer: Self-pay

## 2020-11-24 DIAGNOSIS — M25631 Stiffness of right wrist, not elsewhere classified: Secondary | ICD-10-CM

## 2020-11-24 DIAGNOSIS — M25522 Pain in left elbow: Secondary | ICD-10-CM

## 2020-11-24 DIAGNOSIS — M25632 Stiffness of left wrist, not elsewhere classified: Secondary | ICD-10-CM | POA: Diagnosis not present

## 2020-11-24 DIAGNOSIS — M545 Low back pain, unspecified: Secondary | ICD-10-CM

## 2020-11-24 DIAGNOSIS — G8929 Other chronic pain: Secondary | ICD-10-CM | POA: Diagnosis not present

## 2020-11-24 DIAGNOSIS — M546 Pain in thoracic spine: Secondary | ICD-10-CM

## 2020-11-24 DIAGNOSIS — M25521 Pain in right elbow: Secondary | ICD-10-CM | POA: Diagnosis not present

## 2020-11-24 NOTE — Therapy (Addendum)
[facility] [address]  [place], [place], [address] Phone: [phone]   Fax:  [phone]  Physical Therapy Treatment  Patient Details  Name: Samantha Shepard MRN: [id number] Date of Birth: [birth year] Referring Provider (PT): Billey Gosling, MD   Encounter Date: 11/24/2020   PT End of Session - 11/24/20 1009    Visit Number 14    Number of Visits 25    Date for PT Re-Evaluation 01/02/21    Authorization Type [employer]    Authorization Time Period 12 additional visits approved from 02/14-03/28    Authorization - Visit Number 2    Authorization - Number of Visits 12    Progress Note Due on Visit 22    PT Start Time 0929    PT Stop Time 1009    PT Time Calculation (min) 40 min    Activity Tolerance Patient tolerated treatment well;Patient limited by pain    Behavior During Therapy [facility] for tasks assessed/performed           Past Medical History:  Diagnosis Date  . Allergy    dust, cats  . Anxiety   . Asthma    controlled  . Hyperlipidemia   . Hypertension   . Left breast mass   . Migraines     Past Surgical History:  Procedure Laterality Date  . BREAST EXCISIONAL BIOPSY Right 06/2019   benign  . BREAST LUMPECTOMY WITH RADIOACTIVE SEED LOCALIZATION Left 07/08/2019   Procedure: LEFT BREAST LUMPECTOMY WITH RADIOACTIVE SEED LOCALIZATION;  Surgeon: Jovita Kussmaul, MD;  Location: [facility];  Service: General;  Laterality: Left;  . COLONOSCOPY  2009   neg; [facility]  . thyroid cyst aspirated    . TONSILLECTOMY      There were no vitals filed for this visit.   Subjective Assessment - 11/24/20 0929    Subjective Feeling fine. Yesterday was bad- she walked 40 minutes and was limited by LBP. Has performed her HEP- denies questions.    Pertinent History migraines, HTN, HLD, asthma, anxiety, L breast lumpectomy    Diagnostic tests 08/29/20 B elbow xray: negative    Patient  Stated Goals get my arm to stop hurting    Currently in Pain? Yes    Pain Score 5    Pain Location Shoulder    Pain Orientation Right;Left    Pain Descriptors / Indicators Aching    Pain Type Acute pain                             [facility] Adult PT Treatment/Exercise - 11/24/20 0001      Exercises   Exercises Knee/Hip;Lumbar      Lumbar Exercises: Seated   Other Seated Lumbar Exercises prayer stretch with orange pball 5x anterior, R, L 5x3"      Lumbar Exercises: Quadruped   Madcat/Old Horse 10 reps    Madcat/Old Horse Limitations --   discontinued d/t R forearm pain   Other Quadruped Lumbar Exercises child's pose   attempted but pt could not tolerate d/t knee pain     Knee/Hip Exercises: Stretches   Hip Flexor Stretch Right;Left;30 seconds;2 reps    Hip Flexor Stretch Limitations mod thomas with strap   cueing to correction compensations; 4" step under foot   Piriformis Stretch Right;Left;2 reps;30 seconds    Piriformis Stretch Limitations supine KTOS    Other Knee/Hip Stretches R/L figure  4 supine 30" each      Knee/Hip Exercises: Aerobic   Nustep L5 x 60mn (UEs/LEs)      Knee/Hip Exercises: Seated   Other Seated Knee/Hip Exercises pelvic tilts 15x to tolerance   improved form after practice     Knee/Hip Exercises: Supine   Other Supine Knee/Hip Exercises pelvic tilts 5x to tolerance   marked cueing to posterior tilt     Shoulder Exercises: Seated   Other Seated Exercises R/L bodyblade flexion to 90 deg 5x; neutral shoulder IR/ER 20", 30"; R/L abduction 5x each   great tolerance and ability     Shoulder Exercises: ROM/Strengthening   Cybex Row Limitations narrow grip row 10x 15#, 10x 20#   good tolerance                   PT Short Term Goals - 11/21/20 1236      PT SHORT TERM GOAL #1   Title Patient to be independent with initial HEP.    Time 3    Period Weeks    Status Achieved    Target Date 09/27/20             PT Long Term  Goals - 11/24/20 1010      PT LONG TERM GOAL #1   Title Patient to be independent with advanced HEP.    Time 6    Period Weeks    Status On-going   met for current     PT LONG TERM GOAL #2   Title Patient to demonstrate B elbow and wrist strength >/=4+/5 and B grip strength atleast 25 lbs.    Time 6    Period Weeks    Status Partially Met   still limited in R wrist extension and B grip     PT LONG TERM GOAL #3   Title Patient to demonstrate B wrist and elbow AROM WFL and without pain limiting.    Time 6    Period Weeks    Status Partially Met   improved to nearly WNL     PT LONG TERM GOAL #4   Title Patient to report tolerance for using her iPad for 1 hour without pain limiting.    Time 6    Period Weeks    Status On-going   tolerates 15-20 min     PT LONG TERM GOAL #5   Title Patient to report tolerance for cleaning activities around the house without pain limiting.    Time 6    Period Weeks    Status On-going   tolerated 30-40 min     PT LONG TERM GOAL #6   Title Patient to demonstrate thoracic and lumbar AROM WFL and without pain limiting.    Time 6    Period Weeks    Status On-going      PT LONG TERM GOAL #7   Title Patient to report tolerance of 2 hours on her feet without pain limiting.    Time 6    Period Weeks    Status On-going      PT LONG TERM GOAL #8   Title Patient to demonstrate self-correction of posture at rest and with activity for continued management of LBP.    Time 6    Period Weeks    Status Partially Met   11/24/20: no cueing required to "tuck the tailbone" with standing ther-ex                Plan -  11/24/20 1010    Clinical Impression Statement Patient arrived to session with report of increased LBP yesterday after a long walk, but pain-free here today. Reviewed HEP with intermittent cueing to correct form and modifications for comfort. Patient tolerated hip flexor stretching and practice with posterior pelvic tilting to adjust  resting posture, as patient demonstrates increased lumbar lordosis. Initiated gentle lumbopelvic ROM in quadruped, requiring rest breaks d/t R forearm pain. Attempted child's posed which patient could not tolerated d/t knee pain, thus this was discontinued. Shoulder stability was performed using bodyblade with great tolerance and ability. Patient reported "everything feels good" at end of session. Patient progressing well towards goals.    Personal Factors and Comorbidities Age;Comorbidity 3+;Fitness;Past/Current Experience;Time since onset of injury/illness/exacerbation    Comorbidities migraines, HTN, HLD, asthma, anxiety, L breast lumpectomy    Stability/Clinical Decision Making Stable/Uncomplicated    Rehab Potential Good    PT Frequency 2x / week    PT Duration 6 weeks    PT Treatment/Interventions ADLs/Self Care Home Management;Cryotherapy;Electrical Stimulation;Iontophoresis 37m/ml Dexamethasone;Moist Heat;Therapeutic exercise;Therapeutic activities;Functional mobility training;Ultrasound;Neuromuscular re-education;Patient/family education;Manual techniques;Vasopneumatic Device;Taping;Splinting;Energy conservation;Dry needling;Passive range of motion;Traction;Stair training;Gait training    PT Next Visit Plan core strengthening and lumbar mobility; Progress B shoulder, elbow, and wrist ROM and strength, periscapular strength and thoracic mobility; STM wrist flexors/extensors    PT Home Exercise Plan [id number]    Consulted and Agree with Plan of Care Patient           Patient will benefit from skilled therapeutic intervention in order to improve the following deficits and impairments:  Hypomobility,Increased edema,Decreased activity tolerance,Decreased strength,Increased fascial restricitons,Pain,Impaired UE functional use,Increased muscle spasms,Improper body mechanics,Decreased range of motion,Postural dysfunction,Impaired flexibility,Difficulty walking  Visit Diagnosis: Pain in right  elbow  Pain in left elbow  Stiffness of left wrist, not elsewhere classified  Stiffness of right wrist, not elsewhere classified  Chronic bilateral low back pain without sciatica  Pain in thoracic spine     Problem List Patient Active Problem List   Diagnosis Date Noted  . Upper back pain 11/17/2020  . Moderate persistent asthma with acute exacerbation 08/31/2020  . Gastroesophageal reflux disease 08/31/2020  . Cough 08/31/2020  . Tendinitis 08/15/2020  . Sleep difficulties 07/29/2020  . Mild intermittent asthma without complication 148/88/9169 . History of epistaxis 07/14/2019  . Temporomandibular joint (TMJ) pain 06/16/2018  . Anxiety 06/16/2018  . Cervical adenopathy 06/16/2018  . 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 Acute sinus infection 10/04/2017  . Prediabetes 08/01/2017  . Tingling of both feet 04/18/2016  . Osteopenia 01/15/2016  . Mild persistent asthma 01/10/2016  . Other allergic rhinitis 01/10/2016  . Hyperlipidemia 08/13/2008  . Migraine headache 09/22/2007  . Uncontrolled hypertension 09/22/2007  . Allergic rhinitis 05/26/2007     YJanene Harvey PT, DPT 11/24/20 10:39 AM   [facility] [place] [address]Phone: [phone]  Fax:  [phone] Name: PDarthy ManganelliMRN: [id number] of Birth: 901-19-1957

## 2020-11-28 ENCOUNTER — Ambulatory Visit: Payer: Medicare PPO

## 2020-11-28 ENCOUNTER — Other Ambulatory Visit: Payer: Self-pay

## 2020-11-28 DIAGNOSIS — M545 Low back pain, unspecified: Secondary | ICD-10-CM | POA: Diagnosis not present

## 2020-11-28 DIAGNOSIS — G8929 Other chronic pain: Secondary | ICD-10-CM

## 2020-11-28 DIAGNOSIS — M25632 Stiffness of left wrist, not elsewhere classified: Secondary | ICD-10-CM

## 2020-11-28 DIAGNOSIS — M546 Pain in thoracic spine: Secondary | ICD-10-CM

## 2020-11-28 DIAGNOSIS — M25522 Pain in left elbow: Secondary | ICD-10-CM

## 2020-11-28 DIAGNOSIS — M25521 Pain in right elbow: Secondary | ICD-10-CM

## 2020-11-28 DIAGNOSIS — M25631 Stiffness of right wrist, not elsewhere classified: Secondary | ICD-10-CM

## 2020-11-28 NOTE — Therapy (Signed)
[facility] [place] [address]  [address] [place], [place], [address] Phone: [phone]   Fax:  [phone]  Physical Therapy Treatment  Patient Details  Name: Samantha Shepard MRN: [id number] Date of Birth: 08/26/1956 Referring Provider (PT): Stacy Burns, MD   Encounter Date: 11/28/2020   PT End of Session - 11/28/20 1018    Visit Number 15    Number of Visits 25    Date for PT Re-Evaluation 01/02/21    Authorization Type [employer]    Authorization Time Period 12 additional visits approved from 02/14-03/28    Authorization - Visit Number 2    Authorization - Number of Visits 12    Progress Note Due on Visit 22    PT Start Time 0930    PT Stop Time 1016    PT Time Calculation (min) 46 min    Activity Tolerance Patient tolerated treatment well    Behavior During Therapy WFL for tasks assessed/performed           Past Medical History:  Diagnosis Date  . Allergy    dust, cats  . Anxiety   . Asthma    controlled  . Hyperlipidemia   . Hypertension   . Left breast mass   . Migraines     Past Surgical History:  Procedure Laterality Date  . BREAST EXCISIONAL BIOPSY Right 06/2019   benign  . BREAST LUMPECTOMY WITH RADIOACTIVE SEED LOCALIZATION Left 07/08/2019   Procedure: LEFT BREAST LUMPECTOMY WITH RADIOACTIVE SEED LOCALIZATION;  Surgeon: Toth, Paul III, MD;  Location: [facility];  Service: General;  Laterality: Left;  . COLONOSCOPY  2009   neg; [facility]  . thyroid cyst aspirated    . TONSILLECTOMY      There were no vitals filed for this visit.   Subjective Assessment - 11/28/20 0932    Subjective States she was sitting mostly yesterday so her back" wasn't feeling too good"    Pertinent History migraines, HTN, HLD, asthma, anxiety, L breast lumpectomy    Diagnostic tests 08/29/20 B elbow xray: negative    Patient Stated Goals get my arm to stop hurting    Currently in Pain? Yes     Pain Score 3     Pain Location Elbow    Pain Orientation Right;Anterior    Pain Descriptors / Indicators Sharp    Pain Type Chronic pain                             [facility] Adult PT Treatment/Exercise - 11/28/20 0001      Exercises   Exercises Knee/Hip;Lumbar;Shoulder      Lumbar Exercises: Aerobic   Nustep L5x6min      Lumbar Exercises: Seated   Other Seated Lumbar Exercises ab sets with beach ball 10x    Other Seated Lumbar Exercises prayer stretch orange yoga ball 10x10; R/L 5x10      Lumbar Exercises: Supine   Dead Bug 20 reps    Dead Bug Limitations 10 reps with orange yoga ball      Lumbar Exercises: Quadruped   Madcat/Old Horse 10 reps    Madcat/Old Horse Limitations with serratus punch    Other Quadruped Lumbar Exercises childs pose 3x30 sec      Knee/Hip Exercises: Stretches   Piriformis Stretch Right;Left;2 reps;30 seconds    Piriformis Stretch Limitations seated KTOS      Knee/Hip Exercises: Supine     Knee Flexion Strengthening;Both;10 reps;2 sets    Knee Flexion Limitations marches Gtband      Shoulder Exercises: ROM/Strengthening   Lat Pull 20 reps    Lat Pull Limitations 20#    Cybex Row 20 reps    Cybex Row Limitations narrow grip 20#                    PT Short Term Goals - 11/21/20 1236      PT SHORT TERM GOAL #1   Title Patient to be independent with initial HEP.    Time 3    Period Weeks    Status Achieved    Target Date 09/27/20             PT Long Term Goals - 11/24/20 1010      PT LONG TERM GOAL #1   Title Patient to be independent with advanced HEP.    Time 6    Period Weeks    Status On-going   met for current     PT LONG TERM GOAL #2   Title Patient to demonstrate B elbow and wrist strength >/=4+/5 and B grip strength atleast 25 lbs.    Time 6    Period Weeks    Status Partially Met   still limited in R wrist extension and B grip     PT LONG TERM GOAL #3   Title Patient to demonstrate B  wrist and elbow AROM WFL and without pain limiting.    Time 6    Period Weeks    Status Partially Met   improved to nearly WNL     PT LONG TERM GOAL #4   Title Patient to report tolerance for using her iPad for 1 hour without pain limiting.    Time 6    Period Weeks    Status On-going   tolerates 15-20 min     PT LONG TERM GOAL #5   Title Patient to report tolerance for cleaning activities around the house without pain limiting.    Time 6    Period Weeks    Status On-going   tolerated 30-40 min     PT LONG TERM GOAL #6   Title Patient to demonstrate thoracic and lumbar AROM WFL and without pain limiting.    Time 6    Period Weeks    Status On-going      PT LONG TERM GOAL #7   Title Patient to report tolerance of 2 hours on her feet without pain limiting.    Time 6    Period Weeks    Status On-going      PT LONG TERM GOAL #8   Title Patient to demonstrate self-correction of posture at rest and with activity for continued management of LBP.    Time 6    Period Weeks    Status Partially Met   11/24/20: no cueing required to "tuck the tailbone" with standing ther-ex                Plan - 11/28/20 1019    Clinical Impression Statement Pt responded well to treatment, with no reports of pain. Cues were given throughtout the session for TrA activation and to correctly perform exercises given. Pt noted difficulty with deadbug exercises d/t weakness along with quadriped activities d/t pain in the R forearm. Cues to keep shoulders depressed and retracted during scapular stability exercises to emphasize safe shoulder positiong and to target scapular stabilizers. Patient reported she  felt better after coming into therapy.           Patient will benefit from skilled therapeutic intervention in order to improve the following deficits and impairments:     Visit Diagnosis: Pain in right elbow  Pain in left elbow  Stiffness of left wrist, not elsewhere classified  Stiffness  of right wrist, not elsewhere classified  Chronic bilateral low back pain without sciatica  Pain in thoracic spine     Problem List Patient Active Problem List   Diagnosis Date Noted  . Upper back pain 11/17/2020  . Moderate persistent asthma with acute exacerbation 08/31/2020  . Gastroesophageal reflux disease 08/31/2020  . Cough 08/31/2020  . Tendinitis 08/15/2020  . Sleep difficulties 07/29/2020  . Mild intermittent asthma without complication 07/14/2019  . History of epistaxis 07/14/2019  . Temporomandibular joint (TMJ) pain 06/16/2018  . Anxiety 06/16/2018  . Cervical adenopathy 06/16/2018  . 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 Acute sinus infection 10/04/2017  . Prediabetes 08/01/2017  . Tingling of both feet 04/18/2016  . Osteopenia 01/15/2016  . Mild persistent asthma 01/10/2016  . Other allergic rhinitis 01/10/2016  . Hyperlipidemia 08/13/2008  . Migraine headache 09/22/2007  . Uncontrolled hypertension 09/22/2007  . Allergic rhinitis 05/26/2007    Braylin L Clark, PTA 11/28/2020, 10:27 AM  [facility] [place] [address]  [address] [place], [place], [address] Phone: [phone]   Fax:  [phone]  Name: Samantha Shepard MRN: [id number] Date of Birth: 07/10/1956   

## 2020-12-01 ENCOUNTER — Ambulatory Visit: Payer: Medicare PPO

## 2020-12-01 ENCOUNTER — Other Ambulatory Visit: Payer: Self-pay

## 2020-12-01 DIAGNOSIS — M545 Low back pain, unspecified: Secondary | ICD-10-CM | POA: Diagnosis not present

## 2020-12-01 DIAGNOSIS — M25631 Stiffness of right wrist, not elsewhere classified: Secondary | ICD-10-CM

## 2020-12-01 DIAGNOSIS — M25522 Pain in left elbow: Secondary | ICD-10-CM | POA: Diagnosis not present

## 2020-12-01 DIAGNOSIS — M25632 Stiffness of left wrist, not elsewhere classified: Secondary | ICD-10-CM | POA: Diagnosis not present

## 2020-12-01 DIAGNOSIS — M546 Pain in thoracic spine: Secondary | ICD-10-CM | POA: Diagnosis not present

## 2020-12-01 DIAGNOSIS — M25521 Pain in right elbow: Secondary | ICD-10-CM

## 2020-12-01 DIAGNOSIS — G8929 Other chronic pain: Secondary | ICD-10-CM | POA: Diagnosis not present

## 2020-12-01 NOTE — Therapy (Signed)
[facility] [place] [address]  [place], [place], [address] Phone: [phone]   Fax:  [phone]  Physical Therapy Treatment  Patient Details  Name: Samantha Shepard MRN: [id number] Date of Birth: Sep 15, 1956 Referring Provider (PT): Billey Gosling, MD   Encounter Date: 12/01/2020   PT End of Session - 12/01/20 1132    Visit Number 16    Number of Visits 25    Date for PT Re-Evaluation 01/02/21    Authorization Type [employer]    Authorization Time Period 12 additional visits approved from 02/14-03/28    Authorization - Visit Number 2    Authorization - Number of Visits 12    Progress Note Due on Visit 22    PT Start Time 1016    PT Stop Time 1056    PT Time Calculation (min) 40 min           Past Medical History:  Diagnosis Date  . Allergy    dust, cats  . Anxiety   . Asthma    controlled  . Hyperlipidemia   . Hypertension   . Left breast mass   . Migraines     Past Surgical History:  Procedure Laterality Date  . BREAST EXCISIONAL BIOPSY Right 06/2019   benign  . BREAST LUMPECTOMY WITH RADIOACTIVE SEED LOCALIZATION Left 07/08/2019   Procedure: LEFT BREAST LUMPECTOMY WITH RADIOACTIVE SEED LOCALIZATION;  Surgeon: Jovita Kussmaul, MD;  Location: [facility];  Service: General;  Laterality: Left;  . COLONOSCOPY  2009   neg; [facility]  . thyroid cyst aspirated    . TONSILLECTOMY      There were no vitals filed for this visit.   Subjective Assessment - 12/01/20 1020    Subjective Pt states that she has been feeling good after the last session.    Pertinent History migraines, HTN, HLD, asthma, anxiety, L breast lumpectomy    Currently in Pain? Yes    Pain Score 5     Pain Location Elbow    Pain Orientation Right;Anterior    Pain Descriptors / Indicators Sharp    Pain Type Chronic pain              [facility] PT Assessment - 12/01/20 0001      AROM   Right Wrist Extension  78 Degrees    Right Wrist Flexion 80 Degrees    Right Wrist Radial Deviation 30 Degrees    Right Wrist Ulnar Deviation 28 Degrees    Lumbar Flexion WFL    Lumbar - Right Side Bend limited    Lumbar - Left Side Bend [facility]    Thoracic Flexion [facility]    Thoracic Extension moderately limited    Thoracic - Right Side Bend [facility]    Thoracic - Left Side Bend [facility]      Strength   Right Elbow Flexion 5/5    Right Elbow Extension 5/5    Right Forearm Pronation 4+/5    Right Forearm Supination 4+/5    Right Wrist Flexion 5/5    Right Wrist Extension 5/5    Right Wrist Radial Deviation 4+/5    Right Wrist Ulnar Deviation 4+/5    Right Hand Grip (lbs) 23                         [facility] Adult PT Treatment/Exercise - 12/01/20 0001      Exercises   Exercises Knee/Hip;Lumbar;Shoulder  Lumbar Exercises: Aerobic   Recumbent Bike L2x50mn      Manual Therapy   Manual Therapy Soft tissue mobilization;Myofascial release    Soft tissue mobilization STM to rhomboids, persicap muscles                    PT Short Term Goals - 11/21/20 1236      PT SHORT TERM GOAL #1   Title Patient to be independent with initial HEP.    Time 3    Period Weeks    Status Achieved    Target Date 09/27/20             PT Long Term Goals - 12/01/20 1028      PT LONG TERM GOAL #1   Title Patient to be independent with advanced HEP.    Time 6    Period Weeks    Status Achieved   met for current     PT LONG TERM GOAL #2   Title Patient to demonstrate B elbow and wrist strength >/=4+/5 and B grip strength atleast 25 lbs.    Time 6    Period Weeks    Status Partially Met   4+/5 - 5/5  global elbow and wrist; grip 23#     PT LONG TERM GOAL #3   Title Patient to demonstrate B wrist and elbow AROM WFL and without pain limiting.    Time 6    Period Weeks    Status Partially Met   improved to nearly WNL     PT LONG TERM GOAL #4   Title Patient to report tolerance for using her iPad  for 1 hour without pain limiting.    Time 6    Period Weeks    Status Partially Met   can tolerated 30 min     PT LONG TERM GOAL #5   Title Patient to report tolerance for cleaning activities around the house without pain limiting.    Time 6    Period Weeks    Status Achieved   tolerated 30-40 min     PT LONG TERM GOAL #6   Title Patient to demonstrate thoracic and lumbar AROM WFL and without pain limiting.    Time 6    Period Weeks    Status On-going      PT LONG TERM GOAL #7   Title Patient to report tolerance of 2 hours on her feet without pain limiting.    Time 6    Period Weeks    Status On-going      PT LONG TERM GOAL #8   Title Patient to demonstrate self-correction of posture at rest and with activity for continued management of LBP.    Time 6    Period Weeks    Status Partially Met   11/24/20: no cueing required to "tuck the tailbone" with standing ther-ex                Plan - 12/01/20 1135    Clinical Impression Statement Pt is progresing toward all goals. She did report that she felt like she was doing a lot better than when she started PT. Pt did note that she was able to use her Ipad for about 30 min until a break is required. Her overall strength and ROM in the elbow and wrist is good however grip strength is still under 25 lb. Pt trunk ROM is better w/o pain, pt does have tightness more on her R side  bend and did have a bit of pain in shoulders with trunk flexion. STM to R rhomboids and periscap muscles to inscrease tissue extensibility and promote relaxation post session.    Personal Factors and Comorbidities Age;Comorbidity 3+;Fitness;Past/Current Experience;Time since onset of injury/illness/exacerbation    Comorbidities migraines, HTN, HLD, asthma, anxiety, L breast lumpectomy    PT Frequency 2x / week    PT Duration 6 weeks    PT Treatment/Interventions ADLs/Self Care Home Management;Cryotherapy;Electrical Stimulation;Iontophoresis 14m/ml  Dexamethasone;Moist Heat;Therapeutic exercise;Therapeutic activities;Functional mobility training;Ultrasound;Neuromuscular re-education;Patient/family education;Manual techniques;Vasopneumatic Device;Taping;Splinting;Energy conservation;Dry needling;Passive range of motion;Traction;Stair training;Gait training    PT Next Visit Plan grip strength, lumbar mobility, scapular stability training    PT Home Exercise Plan [id number]    Consulted and Agree with Plan of Care Patient           Patient will benefit from skilled therapeutic intervention in order to improve the following deficits and impairments:  Hypomobility,Increased edema,Decreased activity tolerance,Decreased strength,Increased fascial restricitons,Pain,Impaired UE functional use,Increased muscle spasms,Improper body mechanics,Decreased range of motion,Postural dysfunction,Impaired flexibility,Difficulty walking  Visit Diagnosis: Pain in right elbow  Stiffness of right wrist, not elsewhere classified  Pain in thoracic spine     Problem List Patient Active Problem List   Diagnosis Date Noted  . Upper back pain 11/17/2020  . Moderate persistent asthma with acute exacerbation 08/31/2020  . Gastroesophageal reflux disease 08/31/2020  . Cough 08/31/2020  . Tendinitis 08/15/2020  . Sleep difficulties 07/29/2020  . Mild intermittent asthma without complication 193/90/3009 . History of epistaxis 07/14/2019  . Temporomandibular joint (TMJ) pain 06/16/2018  . Anxiety 06/16/2018  . Cervical adenopathy 06/16/2018  . 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 Acute sinus infection 10/04/2017  . Prediabetes 08/01/2017  . Tingling of both feet 04/18/2016  . Osteopenia 01/15/2016  . Mild persistent asthma 01/10/2016  . Other allergic rhinitis 01/10/2016  . Hyperlipidemia 08/13/2008  . Migraine headache 09/22/2007  . Uncontrolled hypertension 09/22/2007  . Allergic rhinitis 05/26/2007    BArtist Pais PTA 12/01/2020, 11:59 AM  [facility] [place] [address]Phone: [phone]  Fax:  [phone] Name: PChristianna BelmonteMRN: [id number] of Birth: [birth year]

## 2020-12-05 ENCOUNTER — Other Ambulatory Visit: Payer: Self-pay

## 2020-12-05 ENCOUNTER — Ambulatory Visit: Payer: Medicare PPO

## 2020-12-05 DIAGNOSIS — G8929 Other chronic pain: Secondary | ICD-10-CM | POA: Diagnosis not present

## 2020-12-05 DIAGNOSIS — M25522 Pain in left elbow: Secondary | ICD-10-CM | POA: Diagnosis not present

## 2020-12-05 DIAGNOSIS — M546 Pain in thoracic spine: Secondary | ICD-10-CM

## 2020-12-05 DIAGNOSIS — M25631 Stiffness of right wrist, not elsewhere classified: Secondary | ICD-10-CM | POA: Diagnosis not present

## 2020-12-05 DIAGNOSIS — M25521 Pain in right elbow: Secondary | ICD-10-CM | POA: Diagnosis not present

## 2020-12-05 DIAGNOSIS — M545 Low back pain, unspecified: Secondary | ICD-10-CM | POA: Diagnosis not present

## 2020-12-05 DIAGNOSIS — M25632 Stiffness of left wrist, not elsewhere classified: Secondary | ICD-10-CM | POA: Diagnosis not present

## 2020-12-05 NOTE — Therapy (Signed)
[facility] [place] [address]  [place], [place], [address] Phone: [phone]   Fax:  [phone]  Physical Therapy Treatment  Patient Details  Name: Samantha Shepard MRN: [id number] Date of Birth: 1955-12-28 Referring Provider (PT): Billey Gosling, MD   Encounter Date: 12/05/2020   PT End of Session - 12/05/20 0944    Visit Number 17    Number of Visits 25    Date for PT Re-Evaluation 01/02/21    Authorization Type [employer]    Authorization Time Period 12 additional visits approved from 02/14-03/28    Authorization - Visit Number 2    Authorization - Number of Visits 12    Progress Note Due on Visit 22    PT Start Time 0845    PT Stop Time 0928    PT Time Calculation (min) 43 min    Activity Tolerance Patient tolerated treatment well    Behavior During Therapy [facility] for tasks assessed/performed           Past Medical History:  Diagnosis Date  . Allergy    dust, cats  . Anxiety   . Asthma    controlled  . Hyperlipidemia   . Hypertension   . Left breast mass   . Migraines     Past Surgical History:  Procedure Laterality Date  . BREAST EXCISIONAL BIOPSY Right 06/2019   benign  . BREAST LUMPECTOMY WITH RADIOACTIVE SEED LOCALIZATION Left 07/08/2019   Procedure: LEFT BREAST LUMPECTOMY WITH RADIOACTIVE SEED LOCALIZATION;  Surgeon: Jovita Kussmaul, MD;  Location: [facility];  Service: General;  Laterality: Left;  . COLONOSCOPY  2009   neg;  [facility]  . thyroid cyst aspirated    . TONSILLECTOMY      There were no vitals filed for this visit.   Subjective Assessment - 12/05/20 0848    Subjective Pt reports she is a little stiff in her low back from sitting too long yesterday also has not had a chance to do exercises much this weekend    Pertinent History migraines, HTN, HLD, asthma, anxiety, L breast lumpectomy    Diagnostic tests 08/29/20 B elbow xray: negative    Patient Stated  Goals get my arm to stop hurting    Currently in Pain? Yes    Pain Score 4     Pain Location Elbow    Pain Orientation Right;Anterior    Pain Descriptors / Indicators Sharp    Pain Type Chronic pain                             [facility] Adult PT Treatment/Exercise - 12/05/20 0001      Exercises   Exercises Knee/Hip;Lumbar;Shoulder      Lumbar Exercises: Aerobic   Recumbent Bike L3x62mn      Lumbar Exercises: Seated   Other Seated Lumbar Exercises physioball rollouts 10x10 3 way      Lumbar Exercises: Quadruped   Other Quadruped Lumbar Exercises childs pose 3x30 sec    Other Quadruped Lumbar Exercises pedals with TrA activation, 10 reps B   rest required afterward     Knee/Hip Exercises: Standing   Hip Abduction Stengthening;Both;2 sets;10 reps;Knee straight    Abduction Limitations Rtband    Hip Extension Stengthening;Both;2 sets;10 reps;Knee straight    Extension Limitations Rtband      Manual Therapy   Manual Therapy Soft tissue mobilization;Myofascial release    Soft  tissue mobilization STM to rhomboids, persicap muscles                    PT Short Term Goals - 11/21/20 1236      PT SHORT TERM GOAL #1   Title Patient to be independent with initial HEP.    Time 3    Period Weeks    Status Achieved    Target Date 09/27/20             PT Long Term Goals - 12/01/20 1028      PT LONG TERM GOAL #1   Title Patient to be independent with advanced HEP.    Time 6    Period Weeks    Status Achieved   met for current     PT LONG TERM GOAL #2   Title Patient to demonstrate B elbow and wrist strength >/=4+/5 and B grip strength atleast 25 lbs.    Time 6    Period Weeks    Status Partially Met   4+/5 - 5/5  global elbow and wrist; grip 23#     PT LONG TERM GOAL #3   Title Patient to demonstrate B wrist and elbow AROM WFL and without pain limiting.    Time 6    Period Weeks    Status Partially Met   improved to nearly WNL     PT LONG  TERM GOAL #4   Title Patient to report tolerance for using her iPad for 1 hour without pain limiting.    Time 6    Period Weeks    Status Partially Met   can tolerated 30 min     PT LONG TERM GOAL #5   Title Patient to report tolerance for cleaning activities around the house without pain limiting.    Time 6    Period Weeks    Status Achieved   tolerated 30-40 min     PT LONG TERM GOAL #6   Title Patient to demonstrate thoracic and lumbar AROM WFL and without pain limiting.    Time 6    Period Weeks    Status On-going      PT LONG TERM GOAL #7   Title Patient to report tolerance of 2 hours on her feet without pain limiting.    Time 6    Period Weeks    Status On-going      PT LONG TERM GOAL #8   Title Patient to demonstrate self-correction of posture at rest and with activity for continued management of LBP.    Time 6    Period Weeks    Status Partially Met   11/24/20: no cueing required to "tuck the tailbone" with standing ther-ex                Plan - 12/05/20 0944    Clinical Impression Statement Pt responded well to exercises today, introduced standing hip strengthening exercises to strengthening lumbopelvic support and increase pt tolerance to standing positions. Tactile and verbal cues required to keep trunk from leaning during isolated hip exercises. Pt does need breaks from quadriped position d/t UE discomfort and d/t her asthma. STM given to rhomboids and periscap muscles, pt noted that those areas have cont'd to be sore and stated that the massage helps. Instructed pt on use of self massage with tennis ball for self relief.    Personal Factors and Comorbidities Age;Comorbidity 3+;Fitness;Past/Current Experience;Time since onset of injury/illness/exacerbation    Comorbidities migraines, HTN, HLD,  asthma, anxiety, L breast lumpectomy    PT Frequency 2x / week    PT Duration 6 weeks    PT Treatment/Interventions ADLs/Self Care Home Management;Cryotherapy;Electrical  Stimulation;Iontophoresis 107m/ml Dexamethasone;Moist Heat;Therapeutic exercise;Therapeutic activities;Functional mobility training;Ultrasound;Neuromuscular re-education;Patient/family education;Manual techniques;Vasopneumatic Device;Taping;Splinting;Energy conservation;Dry needling;Passive range of motion;Traction;Stair training;Gait training    PT Next Visit Plan grip strength, lumbar mobility, scapular stability training    PT Home Exercise Plan [id number]    Consulted and Agree with Plan of Care Patient           Patient will benefit from skilled therapeutic intervention in order to improve the following deficits and impairments:  Hypomobility,Increased edema,Decreased activity tolerance,Decreased strength,Increased fascial restricitons,Pain,Impaired UE functional use,Increased muscle spasms,Improper body mechanics,Decreased range of motion,Postural dysfunction,Impaired flexibility,Difficulty walking  Visit Diagnosis: Pain in right elbow  Stiffness of right wrist, not elsewhere classified  Pain in thoracic spine  Chronic bilateral low back pain without sciatica     Problem List Patient Active Problem List   Diagnosis Date Noted  . Upper back pain 11/17/2020  . Moderate persistent asthma with acute exacerbation 08/31/2020  . Gastroesophageal reflux disease 08/31/2020  . Cough 08/31/2020  . Tendinitis 08/15/2020  . Sleep difficulties 07/29/2020  . Mild intermittent asthma without complication 171/85/5015 . History of epistaxis 07/14/2019  . Temporomandibular joint (TMJ) pain 06/16/2018  . Anxiety 06/16/2018  . Cervical adenopathy 06/16/2018  . 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 Acute sinus infection 10/04/2017  . Prediabetes 08/01/2017  . Tingling of both feet 04/18/2016  . Osteopenia 01/15/2016  . Mild persistent asthma 01/10/2016  . Other allergic rhinitis 01/10/2016  . Hyperlipidemia 08/13/2008  . Migraine headache 09/22/2007  . Uncontrolled hypertension 09/22/2007  .  Allergic rhinitis 05/26/2007    BArtist Pais PTA 12/05/2020, 9:59 AM  [facility] [place] [address]Phone: [phone]  Fax:  [phone] Name: PLafreda CasebeerMRN: [id number] of Birth: [birth year]

## 2020-12-08 ENCOUNTER — Other Ambulatory Visit: Payer: Self-pay

## 2020-12-08 ENCOUNTER — Ambulatory Visit: Payer: Medicare PPO | Attending: Internal Medicine

## 2020-12-08 DIAGNOSIS — M25521 Pain in right elbow: Secondary | ICD-10-CM | POA: Insufficient documentation

## 2020-12-08 DIAGNOSIS — M25522 Pain in left elbow: Secondary | ICD-10-CM | POA: Diagnosis not present

## 2020-12-08 DIAGNOSIS — M25632 Stiffness of left wrist, not elsewhere classified: Secondary | ICD-10-CM | POA: Diagnosis not present

## 2020-12-08 DIAGNOSIS — G8929 Other chronic pain: Secondary | ICD-10-CM | POA: Insufficient documentation

## 2020-12-08 DIAGNOSIS — M546 Pain in thoracic spine: Secondary | ICD-10-CM | POA: Diagnosis not present

## 2020-12-08 DIAGNOSIS — M25631 Stiffness of right wrist, not elsewhere classified: Secondary | ICD-10-CM | POA: Diagnosis not present

## 2020-12-08 DIAGNOSIS — M545 Low back pain, unspecified: Secondary | ICD-10-CM | POA: Insufficient documentation
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
## 2020-12-12 ENCOUNTER — Ambulatory Visit: Payer: Medicare PPO

## 2020-12-12 ENCOUNTER — Other Ambulatory Visit: Payer: Self-pay

## 2020-12-12 DIAGNOSIS — M25632 Stiffness of left wrist, not elsewhere classified: Secondary | ICD-10-CM | POA: Diagnosis not present

## 2020-12-12 DIAGNOSIS — M546 Pain in thoracic spine: Secondary | ICD-10-CM | POA: Diagnosis not present

## 2020-12-12 DIAGNOSIS — G8929 Other chronic pain: Secondary | ICD-10-CM | POA: Diagnosis not present

## 2020-12-12 DIAGNOSIS — M545 Low back pain, unspecified: Secondary | ICD-10-CM | POA: Diagnosis not present

## 2020-12-12 DIAGNOSIS — M25631 Stiffness of right wrist, not elsewhere classified: Secondary | ICD-10-CM | POA: Diagnosis not present

## 2020-12-12 DIAGNOSIS — M25521 Pain in right elbow: Secondary | ICD-10-CM | POA: Diagnosis not present

## 2020-12-12 DIAGNOSIS — M25522 Pain in left elbow: Secondary | ICD-10-CM | POA: Diagnosis not present

## 2020-12-12 NOTE — Therapy (Signed)
[facility] [address]  [place], [place], [address] Phone: [phone]   Fax:  [phone]  Physical Therapy Treatment  Patient Details  Name: Samantha Shepard MRN: [id number] Date of Birth: 1956-08-30 Referring Provider (PT): Billey Gosling, MD   Encounter Date: 12/12/2020   PT End of Session - 12/12/20 0940    Visit Number 19    Number of Visits 25    Date for PT Re-Evaluation 01/02/21    Authorization Type [employer]    Authorization Time Period 12 additional visits approved from 02/14-03/28    Authorization - Visit Number 2    Authorization - Number of Visits 12    Progress Note Due on Visit 22    PT Start Time 0845    PT Stop Time 0926    PT Time Calculation (min) 41 min    Activity Tolerance Patient tolerated treatment well    Behavior During Therapy [facility] for tasks assessed/performed           Past Medical History:  Diagnosis Date  . Allergy    dust, cats  . Anxiety   . Asthma    controlled  . Hyperlipidemia   . Hypertension   . Left breast mass   . Migraines     Past Surgical History:  Procedure Laterality Date  . BREAST EXCISIONAL BIOPSY Right 06/2019   benign  . BREAST LUMPECTOMY WITH RADIOACTIVE SEED LOCALIZATION Left 07/08/2019   Procedure: LEFT BREAST LUMPECTOMY WITH RADIOACTIVE SEED LOCALIZATION;  Surgeon: Jovita Kussmaul, MD;  Location: [facility];  Service: General;  Laterality: Left;  . COLONOSCOPY  2009   neg; [facility]  . thyroid cyst aspirated    . TONSILLECTOMY      There were no vitals filed for this visit.   Subjective Assessment - 12/12/20 0848    Subjective Pt reports that her elbow is bothering her mostly when she drives no pain at rest    Pertinent History migraines, HTN, HLD, asthma, anxiety, L breast lumpectomy    Diagnostic tests 08/29/20 B elbow xray: negative    Patient Stated Goals get my arm to stop hurting    Currently in Pain?  No/denies                             [facility] Adult PT Treatment/Exercise - 12/12/20 0001      Exercises   Exercises Knee/Hip;Lumbar;Shoulder      Lumbar Exercises: Stretches   Passive Hamstring Stretch Right;Left;30 seconds    Passive Hamstring Stretch Limitations passive in supine    Single Knee to Chest Stretch Right;Left;1 rep;30 seconds    Single Knee to Chest Stretch Limitations passive in supine    Piriformis Stretch Right;Left;30 seconds    Piriformis Stretch Limitations passive in supine      Lumbar Exercises: Aerobic   UBE (Upper Arm Bike) L2x49mn    Recumbent Bike L5x3100m      Lumbar Exercises: Supine   Bridge Compliant;20 reps;Limitations    Bridge Limitations Rtband 10 reps with march; cueing for core activation    Bridge with BaCardinal Healthompliant;10 reps      Shoulder Exercises: Standing   Flexion Strengthening;Both;10 reps;Weights    Shoulder Flexion Weight (lbs) 1    Flexion Limitations back against wall for tactile feedback    ABduction Strengthening;Both;10 reps    Shoulder ABduction Weight (lbs) 1  ABduction Limitations back against wall for tactile feedback; scaption    Other Standing Exercises phyisoball rollout 10x10 flexion      Shoulder Exercises: ROM/Strengthening   Lat Pull Limitations 25# 2x10    Cybex Row Limitations 25# 2x10      Wrist Exercises   Other wrist exercises velcrow board w/ R hand 5x w/ key                    PT Short Term Goals - 11/21/20 1236      PT SHORT TERM GOAL #1   Title Patient to be independent with initial HEP.    Time 3    Period Weeks    Status Achieved    Target Date 09/27/20             PT Long Term Goals - 12/01/20 1028      PT LONG TERM GOAL #1   Title Patient to be independent with advanced HEP.    Time 6    Period Weeks    Status Achieved   met for current     PT LONG TERM GOAL #2   Title Patient to demonstrate B elbow and wrist strength >/=4+/5 and B grip  strength atleast 25 lbs.    Time 6    Period Weeks    Status Partially Met   4+/5 - 5/5  global elbow and wrist; grip 23#     PT LONG TERM GOAL #3   Title Patient to demonstrate B wrist and elbow AROM WFL and without pain limiting.    Time 6    Period Weeks    Status Partially Met   improved to nearly WNL     PT LONG TERM GOAL #4   Title Patient to report tolerance for using her iPad for 1 hour without pain limiting.    Time 6    Period Weeks    Status Partially Met   can tolerated 30 min     PT LONG TERM GOAL #5   Title Patient to report tolerance for cleaning activities around the house without pain limiting.    Time 6    Period Weeks    Status Achieved   tolerated 30-40 min     PT LONG TERM GOAL #6   Title Patient to demonstrate thoracic and lumbar AROM WFL and without pain limiting.    Time 6    Period Weeks    Status On-going      PT LONG TERM GOAL #7   Title Patient to report tolerance of 2 hours on her feet without pain limiting.    Time 6    Period Weeks    Status On-going      PT LONG TERM GOAL #8   Title Patient to demonstrate self-correction of posture at rest and with activity for continued management of LBP.    Time 6    Period Weeks    Status Partially Met   11/24/20: no cueing required to "tuck the tailbone" with standing ther-ex                Plan - 12/12/20 0940    Clinical Impression Statement Pt still is progressing well with decreasing reports of pain and more capacity for exercises. She isn't having as much trouble with her back and reports most trouble being her R elbow. Reintroduced the velcrow board to focus on wrist/elbow strenthening. Cont'd with scap stability exercises to work on shoulder strengthening as  well as grip strength. She had no reports of pain during session except with shld scaption against the wall, she noted some increased pulling in the low back. Cueing was needed to reinforce TrA activation and to prevent any compensation  during exercises.    Personal Factors and Comorbidities Age;Comorbidity 3+;Fitness;Past/Current Experience;Time since onset of injury/illness/exacerbation    Comorbidities migraines, HTN, HLD, asthma, anxiety, L breast lumpectomy    PT Frequency 2x / week    PT Duration 6 weeks    PT Treatment/Interventions ADLs/Self Care Home Management;Cryotherapy;Electrical Stimulation;Iontophoresis 97m/ml Dexamethasone;Moist Heat;Therapeutic exercise;Therapeutic activities;Functional mobility training;Ultrasound;Neuromuscular re-education;Patient/family education;Manual techniques;Vasopneumatic Device;Taping;Splinting;Energy conservation;Dry needling;Passive range of motion;Traction;Stair training;Gait training    PT Next Visit Plan grip strength, lumbar mobility, scapular stability training    PT Home Exercise Plan [id number]    Consulted and Agree with Plan of Care Patient           Patient will benefit from skilled therapeutic intervention in order to improve the following deficits and impairments:  Hypomobility,Increased edema,Decreased activity tolerance,Decreased strength,Increased fascial restricitons,Pain,Impaired UE functional use,Increased muscle spasms,Improper body mechanics,Decreased range of motion,Postural dysfunction,Impaired flexibility,Difficulty walking  Visit Diagnosis: Pain in right elbow  Stiffness of right wrist, not elsewhere classified  Pain in thoracic spine  Chronic bilateral low back pain without sciatica     Problem List Patient Active Problem List   Diagnosis Date Noted  . Upper back pain 11/17/2020  . Moderate persistent asthma with acute exacerbation 08/31/2020  . Gastroesophageal reflux disease 08/31/2020  . Cough 08/31/2020  . Tendinitis 08/15/2020  . Sleep difficulties 07/29/2020  . Mild intermittent asthma without complication 133/00/7622 . History of epistaxis 07/14/2019  . Temporomandibular joint (TMJ) pain 06/16/2018  . Anxiety 06/16/2018  . Cervical  adenopathy 06/16/2018  . 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 Acute sinus infection 10/04/2017  . Prediabetes 08/01/2017  . Tingling of both feet 04/18/2016  . Osteopenia 01/15/2016  . Mild persistent asthma 01/10/2016  . Other allergic rhinitis 01/10/2016  . Hyperlipidemia 08/13/2008  . Migraine headache 09/22/2007  . Uncontrolled hypertension 09/22/2007  . Allergic rhinitis 05/26/2007    BArtist Pais PTA 12/12/2020, 9:50 AM  [facility] [place] [address]Phone: [phone]  Fax:  [phone] Name: Samantha RostenMRN: [id number] of Birth: 902-22-1957

## 2020-12-15 ENCOUNTER — Encounter: Payer: Self-pay | Admitting: Physical Therapy

## 2020-12-15 ENCOUNTER — Other Ambulatory Visit: Payer: Self-pay

## 2020-12-15 ENCOUNTER — Ambulatory Visit: Payer: Medicare PPO | Admitting: Physical Therapy

## 2020-12-15 DIAGNOSIS — M545 Low back pain, unspecified: Secondary | ICD-10-CM

## 2020-12-15 DIAGNOSIS — G8929 Other chronic pain: Secondary | ICD-10-CM | POA: Diagnosis not present

## 2020-12-15 DIAGNOSIS — M25631 Stiffness of right wrist, not elsewhere classified: Secondary | ICD-10-CM

## 2020-12-15 DIAGNOSIS — M25522 Pain in left elbow: Secondary | ICD-10-CM

## 2020-12-15 DIAGNOSIS — M546 Pain in thoracic spine: Secondary | ICD-10-CM

## 2020-12-15 DIAGNOSIS — M25521 Pain in right elbow: Secondary | ICD-10-CM | POA: Diagnosis not present

## 2020-12-15 DIAGNOSIS — M25632 Stiffness of left wrist, not elsewhere classified: Secondary | ICD-10-CM | POA: Diagnosis not present

## 2020-12-15 NOTE — Therapy (Addendum)
[facility] [place] [address]  [address] [place], [place], [address] Phone: [phone]   Fax:  [phone]  Physical Therapy Progress Note  Patient Details  Name: Samantha Shepard MRN: [id number] Date of Birth: 03/03/1956 Referring Provider (PT): Stacy Burns, MD   Progress Note Reporting Period 11/24/20 to 12/15/20  See note below for Objective Data and Assessment of Progress/Goals.     Encounter Date: 12/15/2020   PT End of Session - 12/15/20 0923    Visit Number 20    Number of Visits 25    Date for PT Re-Evaluation 01/02/21    Authorization Type [employer]    Authorization Time Period 12 additional visits approved from 02/14-03/28    Authorization - Visit Number 3    Authorization - Number of Visits 12    Progress Note Due on Visit 22    PT Start Time 0845    PT Stop Time 0923    PT Time Calculation (min) 38 min    Activity Tolerance Patient tolerated treatment well    Behavior During Therapy WFL for tasks assessed/performed           Past Medical History:  Diagnosis Date  . Allergy    dust, cats  . Anxiety   . Asthma    controlled  . Hyperlipidemia   . Hypertension   . Left breast mass   . Migraines     Past Surgical History:  Procedure Laterality Date  . BREAST EXCISIONAL BIOPSY Right 06/2019   benign  . BREAST LUMPECTOMY WITH RADIOACTIVE SEED LOCALIZATION Left 07/08/2019   Procedure: LEFT BREAST LUMPECTOMY WITH RADIOACTIVE SEED LOCALIZATION;  Surgeon: Toth, Paul III, MD;  Location: [facility];  Service: General;  Laterality: Left;  . COLONOSCOPY  2009   neg; [facility]  . thyroid cyst aspirated    . TONSILLECTOMY      There were no vitals filed for this visit.   Subjective Assessment - 12/15/20 0848    Subjective Notes some stiffness all over. Notes a big improvement since starting therapy. Notes 75% improvement thus far. Is walking 5 miles/day.    Pertinent History  migraines, HTN, HLD, asthma, anxiety, L breast lumpectomy    Diagnostic tests 08/29/20 B elbow xray: negative    Patient Stated Goals get my arm to stop hurting    Pain Score 4    Pain Location Shoulder    Pain Orientation Right;Left    Pain Descriptors / Indicators Aching    Pain Type Acute pain              [facility] PT Assessment - 12/15/20 0001      Assessment   Medical Diagnosis Upper back pain    Referring Provider (PT) Stacy Burns, MD    Onset Date/Surgical Date 09/20/20      AROM   Lumbar Flexion toes    Lumbar Extension WFL    Lumbar - Right Side Bend jt line    Lumbar - Left Side Bend jt line    Lumbar - Right Rotation WFL    Lumbar - Left Rotation WFL    Thoracic Flexion WFL    Thoracic Extension moderately limited    Thoracic - Right Side Bend WFL    Thoracic - Left Side Bend WFL    Thoracic - Right Rotation mildly limited    Thoracic - Left Rotation mildly limited      Strength   Right Elbow Flexion   5/5    Right Elbow Extension 4+/5    Right Forearm Pronation 5/5    Right Forearm Supination 4+/5    Right Wrist Flexion 4+/5    Right Wrist Extension 4+/5    Left Wrist Flexion 4+/5    Left Wrist Extension 4+/5    Right Hand Grip (lbs) 22.67   23, 20, 25   Left Hand Grip (lbs) 18.33   25, 25, 25                        [facility] Adult PT Treatment/Exercise - 12/15/20 0001      Lumbar Exercises: Aerobic   UBE (Upper Arm Bike) L2x 3 min forward/3 min back      Lumbar Exercises: Standing   Other Standing Lumbar Exercises thoracic extension at wall 10x   cues for form     Lumbar Exercises: Sidelying   Other Sidelying Lumbar Exercises R/L open book stretch 10x to tolerance                  PT Education - 12/15/20 0920    Education Details update to HEP; discussion on objective progress and remaining impairments    Person(s) Educated Patient    Methods Explanation;Demonstration;Tactile cues;Verbal cues;Handout    Comprehension  Verbalized understanding;Returned demonstration            PT Short Term Goals - 12/15/20 0901      PT SHORT TERM GOAL #1   Title Patient to be independent with initial HEP.    Time 3    Period Weeks    Status Achieved    Target Date 09/27/20             PT Long Term Goals - 12/15/20 0901      PT LONG TERM GOAL #1   Title Patient to be independent with advanced HEP.    Time 6    Period Weeks    Status Achieved   met for current     PT LONG TERM GOAL #2   Title Patient to demonstrate B elbow and wrist strength >/=4+/5 and B grip strength atleast 25 lbs.    Time 6    Period Weeks    Status Partially Met   met for elbow and wrist, grip strength improved but still limited     PT LONG TERM GOAL #3   Title Patient to demonstrate B wrist and elbow AROM WFL and without pain limiting.    Time 6    Period Weeks    Status Achieved      PT LONG TERM GOAL #4   Title Patient to report tolerance for using her iPad for 1 hour without pain limiting.    Time 6    Period Weeks    Status Achieved      PT LONG TERM GOAL #5   Title Patient to report tolerance for cleaning activities around the house without pain limiting.    Time 6    Period Weeks    Status Achieved   tolerated 30-40 min     PT LONG TERM GOAL #6   Title Patient to demonstrate thoracic and lumbar AROM WFL and without pain limiting.    Time 6    Period Weeks    Status On-going   met for lumbar, slightly limited in thoracic extension and B rotation     PT LONG TERM GOAL #7   Title Patient to report tolerance of 2   hours on her feet without pain limiting.    Time 6    Period Weeks    Status Achieved   reports 6 hours     PT LONG TERM GOAL #8   Title Patient to demonstrate self-correction of posture at rest and with activity for continued management of LBP.    Time 6    Period Weeks    Status Achieved   11/24/20: no cueing required to "tuck the tailbone" with standing ther-ex                Plan -  12/15/20 0923    Clinical Impression Statement Patient arrived to session with report of 75% improvement thus far. Notes that she continues to walk 5 miles/day. Reports that she is able to tolerate using her iPad for about an hour before onset of pain, however notes that she has been using it longer than that. Also reports that she is able to tolerate 6 hours up on her feet without pain limiting. Patient also reports improvement in her postural awareness, and notes that correcting her posture allows her to improve her pain levels in her back. Strength goal has been met for elbow and wrist;  grip strength improved but still limited. AROM has been met for lumbar spine, however still slightly limited in thoracic extension and B rotation. Patient denies questions on HEP- adjusted HEP to include thoracic mobility work to address remaining deficits and encouraged patient to working grip strengthening with putty. Patient expressed interest in joining a gym, which will also allow for improved fitness. Patient has demonstrated excellent progress towards goals and is now ready for 30 day hold.    Personal Factors and Comorbidities Age;Comorbidity 3+;Fitness;Past/Current Experience;Time since onset of injury/illness/exacerbation    Comorbidities migraines, HTN, HLD, asthma, anxiety, L breast lumpectomy    PT Frequency 2x / week    PT Duration 6 weeks    PT Treatment/Interventions ADLs/Self Care Home Management;Cryotherapy;Electrical Stimulation;Iontophoresis 4mg/ml Dexamethasone;Moist Heat;Therapeutic exercise;Therapeutic activities;Functional mobility training;Ultrasound;Neuromuscular re-education;Patient/family education;Manual techniques;Vasopneumatic Device;Taping;Splinting;Energy conservation;Dry needling;Passive range of motion;Traction;Stair training;Gait training    PT Next Visit Plan 30 day hold at this time    PT Home Exercise Plan [id number]    Consulted and Agree with Plan of Care Patient            Patient will benefit from skilled therapeutic intervention in order to improve the following deficits and impairments:  Hypomobility,Increased edema,Decreased activity tolerance,Decreased strength,Increased fascial restricitons,Pain,Impaired UE functional use,Increased muscle spasms,Improper body mechanics,Decreased range of motion,Postural dysfunction,Impaired flexibility,Difficulty walking  Visit Diagnosis: Pain in right elbow  Stiffness of right wrist, not elsewhere classified  Pain in thoracic spine  Chronic bilateral low back pain without sciatica  Pain in left elbow  Stiffness of left wrist, not elsewhere classified     Problem List Patient Active Problem List   Diagnosis Date Noted  . Upper back pain 11/17/2020  . Moderate persistent asthma with acute exacerbation 08/31/2020  . Gastroesophageal reflux disease 08/31/2020  . Cough 08/31/2020  . Tendinitis 08/15/2020  . Sleep difficulties 07/29/2020  . Mild intermittent asthma without complication 07/14/2019  . History of epistaxis 07/14/2019  . Temporomandibular joint (TMJ) pain 06/16/2018  . Anxiety 06/16/2018  . Cervical adenopathy 06/16/2018  . 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 Acute sinus infection 10/04/2017  . Prediabetes 08/01/2017  . Tingling of both feet 04/18/2016  . Osteopenia 01/15/2016  . Mild persistent asthma 01/10/2016  . Other allergic rhinitis 01/10/2016  . Hyperlipidemia 08/13/2008  . Migraine headache   09/22/2007  . Uncontrolled hypertension 09/22/2007  . Allergic rhinitis 05/26/2007     Janene Harvey, PT, DPT 12/15/20 9:30 AM   [facility] [address]  [place], [place], [address] Phone: [phone]   Fax:  [phone]  Name: Samantha Shepard [facility] MRN: [id number] Date of Birth: 12-25-1955    PHYSICAL THERAPY DISCHARGE SUMMARY  Visits from Start of Care: 20  Current functional level related to goals /  functional outcomes: See above clinical impression; patient did not return during hold   Remaining deficits: Decreased grip strength, limited thoracolumbar ROM   Education / Equipment: HEP  Plan: Patient agrees to discharge.  Patient goals were partially met. Patient is being discharged due to being pleased with the current functional level.  ?????     Janene Harvey, PT, DPT 01/23/21 11:37 AM

## 2020-12-19 ENCOUNTER — Ambulatory Visit: Payer: Medicare PPO

## 2021-01-25 ENCOUNTER — Other Ambulatory Visit: Payer: Self-pay | Admitting: Internal Medicine

## 2021-01-25 NOTE — Progress Notes (Signed)
Subjective:    Patient ID: Samantha Shepard, female    DOB: [birth year], 65 y.o.   MRN: [id number]  HPI The patient is here for follow up of their chronic medical problems, including htn, hyperlipidemia, anxiety, prediabetes  She is taking all of her medications as prescribed.   She exercises  - she is walking 30-45 min a day.   She is eating more healthy.  She is frustrated by her lack of weight loss.     Medications and allergies reviewed with patient and updated if appropriate.  Patient Active Problem List   Diagnosis Date Noted  . Upper back pain 11/17/2020  . Moderate persistent asthma with acute exacerbation 08/31/2020  . Gastroesophageal reflux disease 08/31/2020  . Cough 08/31/2020  . Tendinitis 08/15/2020  . Sleep difficulties 07/29/2020  . Mild intermittent asthma without complication 09/32/3557  . History of epistaxis 07/14/2019  . Temporomandibular joint (TMJ) pain 06/16/2018  . Anxiety 06/16/2018  . Cervical adenopathy 06/16/2018  . 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 Prediabetes 08/01/2017  . Tingling of both feet 04/18/2016  . Osteopenia 01/15/2016  . Mild persistent asthma 01/10/2016  . Other allergic rhinitis 01/10/2016  . Hyperlipidemia 08/13/2008  . Migraine headache 09/22/2007  . Hypertension 09/22/2007  . Allergic rhinitis 05/26/2007    Current Outpatient Medications on File Prior to Visit  Medication Sig Dispense Refill  . albuterol (VENTOLIN HFA) 108 (90 Base) MCG/ACT inhaler INHALE 2 PUFFS INTO THE LUNGS EVERY 4 (FOUR) HOURS AS NEEDED FOR WHEEZING OR SHORTNESS OF BREATH. 18 g 1  . cholecalciferol (VITAMIN D3) 25 MCG (1000 UT) tablet Take 1,000 Units by mouth daily.    . ciclesonide (ALVESCO) 80 MCG/ACT inhaler 2 puffs twice daily with spacer to prevent coughing or wheezing. 2 each 3  . clonazePAM (KLONOPIN) 0.5 MG tablet clonazepam 0.5 mg tablet  TAKE 1 TABLET BY MOUTH AT BEDTIME AS NEEDED FOR ANXIETY OR TMJ    . cyclobenzaprine (FLEXERIL) 5  MG tablet Take 1-2 tablets (5-10 mg total) by mouth at bedtime as needed for muscle spasms. 25 tablet 0  . estradiol-norethindrone (ACTIVELLA) 1-0.5 MG tablet Take 1 mg by mouth every other day.    . gabapentin (NEURONTIN) 100 MG capsule TAKE 1-3 CAPSULES (100-300 MG TOTAL) BY MOUTH AT BEDTIME. 90 capsule 3  . losartan-hydrochlorothiazide (HYZAAR) 100-25 MG tablet TAKE 1 TABLET BY MOUTH DAILY 90 tablet 3  . magnesium 30 MG tablet Take 30 mg by mouth 2 (two) times daily.    . montelukast (SINGULAIR) 10 MG tablet Take 1 tablet once a day when you are in the mountains 90 tablet 1  . Multiple Vitamin (MULTIVITAMIN WITH MINERALS) TABS tablet Take 1 tablet by mouth daily.     No current facility-administered medications on file prior to visit.    Past Medical History:  Diagnosis Date  . Allergy    dust, cats  . Anxiety   . Asthma    controlled  . Hyperlipidemia   . Hypertension   . Left breast mass   . Migraines     Past Surgical History:  Procedure Laterality Date  . BREAST EXCISIONAL BIOPSY Right 06/2019   benign  . BREAST LUMPECTOMY WITH RADIOACTIVE SEED LOCALIZATION Left 07/08/2019   Procedure: LEFT BREAST LUMPECTOMY WITH RADIOACTIVE SEED LOCALIZATION;  Surgeon: Jovita Kussmaul, MD;  Location: [facility];  Service: General;  Laterality: Left;  . COLONOSCOPY  2009   neg; [facility]  . thyroid cyst aspirated    .  TONSILLECTOMY      Social History   Socioeconomic History  . Marital status: Married    Spouse name: Not on file  . Number of children: 0  . Years of education: 24  . Highest education level: Not on file  Occupational History  . Occupation: retired  Tobacco Use  . Smoking status: Never Smoker  . Smokeless tobacco: Never Used  Vaping Use  . Vaping Use: Never used  Substance and Sexual Activity  . Alcohol use: Yes    Alcohol/week: 7.0 standard drinks    Types: 7 Glasses of wine per week    Comment: wine   . Drug use: No  . Sexual activity: Not  on file  Other Topics Concern  . Not on file  Social History Narrative   Exercise: regular      Right handed      One story home   Social Determinants of Health   Financial Resource Strain: Not on file  Food Insecurity: Not on file  Transportation Needs: Not on file  Physical Activity: Not on file  Stress: Not on file  Social Connections: Not on file    Family History  Problem Relation Age of Onset  . Heart disease Mother        CABG 3 vessel @ 26  . Hypertension Mother   . Hyperlipidemia Mother   . Diabetes Mother   . Stroke Mother 16  . Prostate cancer Father   . Hypertension Father   . Breast cancer Sister   . Hyperlipidemia Sister   . Allergic rhinitis Neg Hx   . Asthma Neg Hx   . Angioedema Neg Hx   . Eczema Neg Hx   . Immunodeficiency Neg Hx   . Urticaria Neg Hx     Review of Systems  Constitutional: Negative for chills and fever.  Respiratory: Positive for cough and chest tightness (occ - with asthma). Negative for shortness of breath and wheezing.   Cardiovascular: Positive for leg swelling (mild at times). Negative for chest pain and palpitations.  Neurological: Negative for light-headedness and headaches.       Objective:   Vitals:   01/26/21 1005  BP: 128/72  Pulse: 77  Temp: 98.1 F (36.7 C)  SpO2: 97%   BP Readings from Last 3 Encounters:  01/26/21 128/72  11/17/20 132/76  09/28/20 112/68   Wt Readings from Last 3 Encounters:  01/26/21 151 lb (68.5 kg)  11/17/20 150 lb (68 kg)  09/28/20 155 lb 6.4 oz (70.5 kg)   Body mass index is 28.53 kg/m.   Physical Exam    Constitutional: Appears well-developed and well-nourished. No distress.  HENT:  Head: Normocephalic and atraumatic.  Neck: Neck supple. No tracheal deviation present. No thyromegaly present.  No cervical lymphadenopathy Cardiovascular: Normal rate, regular rhythm and normal heart sounds.   No murmur heard. No carotid bruit .  No edema Pulmonary/Chest: Effort normal and  breath sounds normal. No respiratory distress. No has no wheezes. No rales.  Skin: Skin is warm and dry. Not diaphoretic.  Psychiatric: Normal mood and affect. Behavior is normal.      Assessment & Plan:    See Problem List for Assessment and Plan of chronic medical problems.    This visit occurred during the SARS-CoV-2 public health emergency.  Safety protocols were in place, including screening questions prior to the visit, additional usage of staff PPE, and extensive cleaning of exam room while observing appropriate contact time as indicated for disinfecting  solutions.

## 2021-01-25 NOTE — Patient Instructions (Addendum)
  Blood work was ordered.      Medications changes include :   Stop the amlodipine.  Take 1/2-1 tab of the losartan-hctz daily based on your BP      Please followup in 6 months

## 2021-01-26 ENCOUNTER — Encounter: Payer: Self-pay | Admitting: Internal Medicine

## 2021-01-26 ENCOUNTER — Ambulatory Visit (INDEPENDENT_AMBULATORY_CARE_PROVIDER_SITE_OTHER): Payer: Medicare PPO | Admitting: Internal Medicine

## 2021-01-26 ENCOUNTER — Other Ambulatory Visit: Payer: Self-pay

## 2021-01-26 VITALS — BP 128/72 | HR 77 | Temp 98.1°F | Ht 61.0 in | Wt 151.0 lb

## 2021-01-26 DIAGNOSIS — I10 Essential (primary) hypertension: Secondary | ICD-10-CM | POA: Diagnosis not present

## 2021-01-26 DIAGNOSIS — E7849 Other hyperlipidemia: Secondary | ICD-10-CM

## 2021-01-26 DIAGNOSIS — M85851 Other specified disorders of bone density and structure, right thigh: Secondary | ICD-10-CM

## 2021-01-26 DIAGNOSIS — R7303 Prediabetes: Secondary | ICD-10-CM

## 2021-01-26 DIAGNOSIS — M26622 Arthralgia of left temporomandibular joint: Secondary | ICD-10-CM | POA: Diagnosis not present

## 2021-01-26 DIAGNOSIS — M85852 Other specified disorders of bone density and structure, left thigh: Secondary | ICD-10-CM | POA: Diagnosis not present

## 2021-01-26 DIAGNOSIS — R202 Paresthesia of skin: Secondary | ICD-10-CM

## 2021-01-26 DIAGNOSIS — G479 Sleep disorder, unspecified: Secondary | ICD-10-CM

## 2021-01-26 DIAGNOSIS — F419 Anxiety disorder, unspecified: Secondary | ICD-10-CM

## 2021-01-26 NOTE — Assessment & Plan Note (Signed)
Chronic Intermittent Will taking clonazepam 0.25 mg HS prn

## 2021-01-26 NOTE — Addendum Note (Signed)
Addended by: Binnie Rail on: 01/26/2021 10:40 AM   Modules accepted: Orders

## 2021-01-26 NOTE — Assessment & Plan Note (Signed)
Chronic Check lipid panel  Diet controlled Regular exercise and healthy diet encouraged

## 2021-01-26 NOTE — Assessment & Plan Note (Addendum)
Chronic BP well controlled and often low at home Stop amlodipine Continue losartan-hctz 100-25 mg daily cmp

## 2021-01-26 NOTE — Assessment & Plan Note (Addendum)
Chronic dexa due - ordered Continue regular walking  Continue vitamin d daily Advised 1200 mg of calcium / day from supplements/food

## 2021-01-26 NOTE — Assessment & Plan Note (Addendum)
Chronic improved- the walking is helping Causes difficulty sleeping Controlled, stable Continue clonazepam 0.25 mg HS prn

## 2021-01-26 NOTE — Assessment & Plan Note (Signed)
Chronic Check a1c Low sugar / carb diet Stressed regular exercise  

## 2021-01-26 NOTE — Assessment & Plan Note (Signed)
Chronic Intermittent Continue clonazepam 0.25 mg HS prn

## 2021-01-26 NOTE — Assessment & Plan Note (Signed)
Chronic Feet tingle and are hot at night taking gabapentin 100 mg HS and that helps Continue gabapentin 100 mg at night

## 2021-01-31 ENCOUNTER — Other Ambulatory Visit: Payer: Medicare PPO

## 2021-02-14 ENCOUNTER — Ambulatory Visit: Payer: Medicare PPO | Admitting: Family

## 2021-02-14 ENCOUNTER — Other Ambulatory Visit: Payer: Self-pay | Admitting: Family

## 2021-02-14 ENCOUNTER — Ambulatory Visit (INDEPENDENT_AMBULATORY_CARE_PROVIDER_SITE_OTHER)
Admission: RE | Admit: 2021-02-14 | Discharge: 2021-02-14 | Disposition: A | Discharge status: Home / Self Care | Payer: Medicare PPO | Source: Ambulatory Visit | Attending: Internal Medicine | Admitting: Internal Medicine

## 2021-02-14 ENCOUNTER — Other Ambulatory Visit (INDEPENDENT_AMBULATORY_CARE_PROVIDER_SITE_OTHER): Payer: Medicare PPO

## 2021-02-14 ENCOUNTER — Encounter: Payer: Self-pay | Admitting: Family

## 2021-02-14 ENCOUNTER — Other Ambulatory Visit: Payer: Self-pay

## 2021-02-14 VITALS — BP 136/72 | HR 85 | Temp 98.3°F | Resp 17 | Ht 61.5 in | Wt 154.6 lb

## 2021-02-14 DIAGNOSIS — K219 Gastro-esophageal reflux disease without esophagitis: Secondary | ICD-10-CM

## 2021-02-14 DIAGNOSIS — E7849 Other hyperlipidemia: Secondary | ICD-10-CM

## 2021-02-14 DIAGNOSIS — I10 Essential (primary) hypertension: Secondary | ICD-10-CM | POA: Diagnosis not present

## 2021-02-14 DIAGNOSIS — M85852 Other specified disorders of bone density and structure, left thigh: Secondary | ICD-10-CM | POA: Diagnosis not present

## 2021-02-14 DIAGNOSIS — J3089 Other allergic rhinitis: Secondary | ICD-10-CM

## 2021-02-14 DIAGNOSIS — J4540 Moderate persistent asthma, uncomplicated: Secondary | ICD-10-CM

## 2021-02-14 DIAGNOSIS — R059 Cough, unspecified: Secondary | ICD-10-CM | POA: Diagnosis not present

## 2021-02-14 DIAGNOSIS — M85851 Other specified disorders of bone density and structure, right thigh: Secondary | ICD-10-CM

## 2021-02-14 DIAGNOSIS — R7303 Prediabetes: Secondary | ICD-10-CM

## 2021-02-14 LAB — LIPID PANEL
Cholesterol: 199 mg/dL (ref 0–200)
HDL: 42.9 mg/dL (ref >39.00)
NonHDL: 156.54
Total CHOL/HDL Ratio: 5
Triglycerides: 207 mg/dL — ABNORMAL HIGH (ref 0.0–149.0)
VLDL: 41.4 mg/dL — ABNORMAL HIGH (ref 0.0–40.0)

## 2021-02-14 LAB — COMPREHENSIVE METABOLIC PANEL
ALT: 27 U/L (ref 0–35)
AST: 19 U/L (ref 0–37)
Albumin: 4.2 g/dL (ref 3.5–5.2)
Alkaline Phosphatase: 57 U/L (ref 39–117)
BUN: 19 mg/dL (ref 6–23)
CO2: 27 mEq/L (ref 19–32)
Calcium: 9.9 mg/dL (ref 8.4–10.5)
Chloride: 100 mEq/L (ref 96–112)
Creatinine, Ser: 0.84 mg/dL (ref 0.40–1.20)
GFR: 72.3 mL/min (ref >60.00)
Glucose, Bld: 100 mg/dL — ABNORMAL HIGH (ref 70–99)
Potassium: 3.8 mEq/L (ref 3.5–5.1)
Sodium: 136 mEq/L (ref 135–145)
Total Bilirubin: 0.6 mg/dL (ref 0.2–1.2)
Total Protein: 7.3 g/dL (ref 6.0–8.3)

## 2021-02-14 LAB — LDL CHOLESTEROL, DIRECT: Direct LDL: 133 mg/dL

## 2021-02-14 LAB — HEMOGLOBIN A1C: Hgb A1c MFr Bld: 5.9 % (ref 4.6–6.5)

## 2021-02-14 MED ORDER — MOMETASONE FUROATE 50 MCG/ACT NA SUSP: NASAL | 5 refills | Status: DC

## 2021-02-14 MED ORDER — MONTELUKAST SODIUM 10 MG PO TABS: ORAL_TABLET | ORAL | 1 refills | Status: DC

## 2021-02-14 MED ORDER — LEVOCETIRIZINE DIHYDROCHLORIDE 5 MG PO TABS: ORAL_TABLET | ORAL | 5 refills | Status: DC

## 2021-02-14 MED ORDER — ALVESCO 80 MCG/ACT IN AERS: INHALATION_SPRAY | RESPIRATORY_TRACT | 3 refills | Status: DC

## 2021-02-14 NOTE — Patient Instructions (Addendum)
Asthma Increase Alvesco 80 mcg-2 puffs twice a day with a spacer to prevent cough or wheeze. Use this every day until your next follow up appointment  Continue montelukast 10 mg once a day to prevent cough or wheeze Continue albuterol 2 puffs once every 4 hours as needed You may use albuterol 2 puffs 5-15 minutes before activity to decrease cough or wheeze  Allergic rhinitis Start mometasone nasal spray 50 mcg 1-2 sprays each nostril once a day as needed for stuffy nose Start Xyzal (levocetirizine) 5 mg once a day as needed for runny nose/itching Continue ipratroprium nasal spray 2 sprays in each nostril twice a day as needed for a runny nose Consider saline nasal rinses as needed for nasal symptoms. Use this before any medicated nasal sprays for best result May use Mucinex 600 mg twice a day as needed for thick postnasal drainage.  Make sure to drink plenty of fluids  Reflux Continue dietary and lifestyle modifications  Continue esomeprazole once a day to control reflux. Take up to 1 hour before meals for best result  Cough See treatment plan above  Call the clinic if this treatment plan is not working well for you  Follow up in 2 months or sooner if needed.

## 2021-02-14 NOTE — Progress Notes (Signed)
[address] [place] [address] Dept: [phone]  FOLLOW UP NOTE  Patient ID: Samantha Shepard [facility], female    DOB: Jul 28, 1956  Age: 65 y.o. MRN: [id number] Date of Office Visit: 02/14/2021  Assessment  Chief Complaint: Nasal Congestion (Earring ache,) and Cough (Swollen throat.)  HPI Samantha Shepard is a 65 year old female who presents today for an acute visit.  She was last seen on August 31, 2020 by Samantha Morgan, FNP for moderate persistent asthma with acute exacerbation, allergic rhinitis, gastroesophageal reflux disease, and cough.  Moderate persistent asthma is reported as moderately controlled with Alvesco 80 mcg 1 to 2 puffs a day on most days, but since Sunday she started using Alvesco 80 mcg 2 puffs twice a day due to worsening of cough.  She also continues to take montelukast 10 mg once a day and albuterol as needed.  She denies any wheezing, tightness in her chest, shortness of breath, nocturnal awakenings.  Since her last office visit she has not required any systemic steroids or made any trips to the emergency room or urgent care due to breathing problems.  She reports very seldom use of her albuterol inhaler.  Allergic rhinitis is reported as not well controlled with ipratropium bromide nasal spray as needed.  Yesterday she took Xyzal and this morning she took Mucinex. She feels that Mucinex has helped a great deal.  She does not use saline nasal rinses.  She has tried Flonase nasal spray in the past with no relief in symptoms.  She reports clear rhinorrhea, some postnasal drip, some nasal congestion that is not too bad, and pressure/pain in her ears.  She denies any fever or chills  Reflux is reported as moderately controlled with esomeprazole once a day.  She reports occasional heartburn.   Drug Allergies:  Allergies  Allergen Reactions  . Sulfonamide Derivatives     REACTION: rash  . Codeine     REACTION: Excitability--can't sleep    Review of  Systems: Review of Systems  Constitutional: Negative for chills and fever.  HENT:       Reports clear rhinorrhea, postnasal drip some nasal congestion but not too bad, and pressure/pain in bilateral ears  Eyes:       Reports itchy watery eyes but would like to hold off on eyedrops for now  Respiratory: Positive for cough. Negative for shortness of breath and wheezing.        Reports cough due to postnasal drainage  Cardiovascular: Negative for chest pain and palpitations.  Gastrointestinal: Positive for heartburn.       Reports occasional heartburn.  Currently on esomeprazole once a day  Genitourinary: Negative for dysuria.  Skin: Negative for itching and rash.  Neurological: Positive for headaches.       Reports occasional headaches  Endo/Heme/Allergies: Positive for environmental allergies.    Physical Exam: BP 136/72 (BP Location: Right Arm, Patient Position: Sitting)   Pulse 85   Temp 98.3 F (36.8 C) (Temporal)   Resp 17   Ht 5' 1.5" (1.562 m)   Wt 154 lb 9.6 oz (70.1 kg)   SpO2 100%   BMI 28.74 kg/m    Physical Exam Constitutional:      Appearance: Normal appearance.  HENT:     Head: Normocephalic and atraumatic.     Comments: Pharynx normal, eyes normal, ears unable to visualize right tympanic membrane due to cerumen.  Left ear normal, nose bilateral lower turbinates mildly edematous and slightly erythematous with clear drainage noted  Right Ear: Ear canal and external ear normal.     Left Ear: Tympanic membrane, ear canal and external ear normal.     Mouth/Throat:     Mouth: Mucous membranes are moist.     Pharynx: Oropharynx is clear.  Eyes:     Conjunctiva/sclera: Conjunctivae normal.  Cardiovascular:     Rate and Rhythm: Regular rhythm.     Heart sounds: Normal heart sounds.  Pulmonary:     Effort: Pulmonary effort is normal.     Breath sounds: Normal breath sounds.     Comments: Lungs clear to auscultation Musculoskeletal:     Cervical back: Neck  supple.  Skin:    General: Skin is warm.  Neurological:     Mental Status: She is alert and oriented to person, place, and time.  Psychiatric:        Mood and Affect: Mood normal.        Behavior: Behavior normal.        Thought Content: Thought content normal.        Judgment: Judgment normal.     Diagnostics: FVC 1.77 L, FEV1 1.45 L.  Predicted FVC 2.70 L, FEV1 2.12 L.  Spirometry indicates possible moderate restriction.  Post bronchodilator response shows FVC 1.76 L, FEV1 1.45 L.  Spirometry indicates possible moderate restriction.  There is no change in FEV1.  Assessment and Plan: 1. Not well controlled moderate persistent asthma   2. Other allergic rhinitis   3. Gastroesophageal reflux disease, unspecified whether esophagitis present   4. Cough     Meds ordered this encounter  Medications  . mometasone (NASONEX) 50 MCG/ACT nasal spray    Sig: 1-2 spray each nostril oncef a day as needed for stuffy nose.    Dispense:  1 each    Refill:  5  . levocetirizine (XYZAL) 5 MG tablet    Sig: Take 1 tablet once a day as needed for runny nose/itching.    Dispense:  30 tablet    Refill:  5  . ciclesonide (ALVESCO) 80 MCG/ACT inhaler    Sig: 2 puffs twice daily with spacer to prevent coughing or wheezing.    Dispense:  1 each    Refill:  3  . montelukast (SINGULAIR) 10 MG tablet    Sig: Take 1 tablet once a day    Dispense:  90 tablet    Refill:  1    Please dispense 90 day supply.    Patient Instructions  Asthma Increase Alvesco 80 mcg-2 puffs twice a day with a spacer to prevent cough or wheeze. Use this every day until your next follow up appointment  Continue montelukast 10 mg once a day to prevent cough or wheeze Continue albuterol 2 puffs once every 4 hours as needed You may use albuterol 2 puffs 5-15 minutes before activity to decrease cough or wheeze  Allergic rhinitis Start mometasone nasal spray 50 mcg 1-2 sprays each nostril once a day as needed for stuffy  nose Start Xyzal (levocetirizine) 5 mg once a day as needed for runny nose/itching Continue ipratroprium nasal spray 2 sprays in each nostril twice a day as needed for a runny nose Consider saline nasal rinses as needed for nasal symptoms. Use this before any medicated nasal sprays for best result May use Mucinex 600 mg twice a day as needed for thick postnasal drainage.  Make sure to drink plenty of fluids  Reflux Continue dietary and lifestyle modifications  Continue esomeprazole once a day to control  reflux. Take up to 1 hour before meals for best result  Cough See treatment plan above  Call the clinic if this treatment plan is not working well for you  Follow up in 2 months or sooner if needed.    Return in about 2 months (around 04/16/2021), or if symptoms worsen or fail to improve.    Thank you for the opportunity to care for this patient.  Please do not hesitate to contact me with questions.  Althea Charon, FNP [facility]

## 2021-02-15 ENCOUNTER — Telehealth: Payer: Self-pay | Admitting: Family

## 2021-02-15 NOTE — Telephone Encounter (Signed)
Pt states Chrissie asked her to let her know how she feels today and she says she feels worse than she did yesterday,

## 2021-02-15 NOTE — Telephone Encounter (Signed)
Spoke to patient and she states she actually feels worse than yesterday. Patients states she gets this way when the season changes and a prednisone baby pack usually knocks it out. Patient is going out of town for the weekend doesn't have a lot of turn around time. Pharmacy [facility]  [address]

## 2021-02-16 ENCOUNTER — Other Ambulatory Visit: Payer: Self-pay

## 2021-02-16 MED ORDER — PREDNISONE 10 MG PO TABS: ORAL_TABLET | ORAL | 0 refills | Status: DC

## 2021-02-16 NOTE — Telephone Encounter (Signed)
Pt was called and notify of script being sent to pharmacy and instructions was given. Pt. Verbalized understanding.

## 2021-02-16 NOTE — Telephone Encounter (Signed)
Please send in a prescription for prednisone 10 mg taking 1 tablet twice a day for 4 days, then on the fifth day take 1 tablet and stop.  Quantity 9 tablets with 0 refills.  Thank you.

## 2021-04-17 NOTE — Patient Instructions (Addendum)
Asthma Continue Alvesco 80 mcg-2 puffs once a day with a spacer to prevent cough or wheeze.  Continue montelukast 10 mg once a day to prevent cough or wheeze Continue albuterol 2 puffs once every 4 hours as needed You may use albuterol 2 puffs 5-15 minutes before activity to decrease cough or wheeze  Allergic rhinitis Continue mometasone nasal spray 50 mcg 1-2 sprays each nostril once a day as needed for stuffy nose Contiue Xyzal (levocetirizine) 5 mg once a day as needed for runny nose/itching Continue ipratroprium nasal spray 2 sprays in each nostril twice a day as needed for a runny nose Consider saline nasal rinses as needed for nasal symptoms. Use this before any medicated nasal sprays for best result May use Mucinex 600 mg twice a day as needed for thick postnasal drainage.  Make sure to drink plenty of fluids  Reflux Continue dietary and lifestyle modifications  Continue esomeprazole once a day as needed to control reflux. Take up to 1 hour before meals for best result  Cough See treatment plan above  Schedule an appointment with your primary care physician to discuss palpitations.  Call the clinic if this treatment plan is not working well for you  Follow up in 5 months or sooner if needed.

## 2021-04-18 ENCOUNTER — Other Ambulatory Visit: Payer: Self-pay

## 2021-04-18 ENCOUNTER — Ambulatory Visit: Payer: Medicare PPO | Admitting: Family

## 2021-04-18 ENCOUNTER — Encounter: Payer: Self-pay | Admitting: Family

## 2021-04-18 DIAGNOSIS — R059 Cough, unspecified: Secondary | ICD-10-CM

## 2021-04-18 DIAGNOSIS — K219 Gastro-esophageal reflux disease without esophagitis: Secondary | ICD-10-CM | POA: Diagnosis not present

## 2021-04-18 DIAGNOSIS — J3089 Other allergic rhinitis: Secondary | ICD-10-CM

## 2021-04-18 DIAGNOSIS — J4541 Moderate persistent asthma with (acute) exacerbation: Secondary | ICD-10-CM

## 2021-04-18 NOTE — Progress Notes (Signed)
[address] [place] [address] Dept: [phone]  FOLLOW UP NOTE  Patient ID: Samantha Shepard [facility], female    DOB: [birth year]  Age: 65 y.o. MRN: [id number] Date of Office Visit: 04/18/2021  Assessment  Chief Complaint: Follow-up (Overall health well managed.), Asthma (Occasional asthma flare up during severely hot and humid weather.), and Allergic Rhinitis   HPI Samantha Shepard is a 65 year old female who presents today for follow-up of not well controlled moderate persistent asthma, allergic rhinitis, and gastroesophageal reflux disease.  She was last seen on Feb 14, 2021 by Althea Charon, FNP.  Her last office visit her husband had open heart surgery 3 weeks ago and is doing fabulous.   Asthma is reported as doing better since her last office visit.  She is currently using Alvesco 80 mcg 2 puffs once a day, montelukast 10 mg once a day and albuterol as needed.  She reports that as soon as she started feeling better she stopped using the Alvesco 80 mcg 2 puffs twice a day with spacer.  She reports a dry cough that occurs sometimes, tightness in her chest every once in a while, and shortness of breath every once in a while.  She denies any wheezing or nocturnal awakenings.  Since her last office visit she has not made any trips to the emergency room or urgent care due to breathing problems and has not required any systemic steroids.  She reports that she never uses her albuterol inhaler.  Allergic rhinitis is reported as moderately controlled with mometasone nasal spray as needed, Xyzal 5 mg as needed, ipratropium bromide nasal spray as needed, and Mucinex as needed.  She reports occasional rhinorrhea and nasal congestion.  She also mentions that she has postnasal drip at times and is is probably the cause of her cough.  She reports that she does not like to take medicine daily.  Reflux is reported as controlled with use esomeprazole as needed.  She reports that she  stopped taking esomeprazole daily since she was not having any heartburn or reflux symptoms.   Drug Allergies:  Allergies  Allergen Reactions   Sulfonamide Derivatives     REACTION: rash   Codeine     REACTION: Excitability--can't sleep    Review of Systems: Review of Systems  Constitutional:  Negative for chills and fever.  HENT:         She reports occasional rhinorrhea and nasal congestion.  She also reports postnasal drip at times and this probably causes her to cough.  She has not had any sinus infections as we last saw her.  Eyes:        Denies itchy watery eyes  Respiratory:  Positive for cough and shortness of breath. Negative for wheezing.        Reports a cough that occurs sometimes.  The cough is dry.  She also reports tightness in her chest once in a while and shortness of breath once in a while.  Cardiovascular:  Positive for palpitations. Negative for chest pain.       Reports occasional palpitations.  She reports that she has had this before.  Gastrointestinal:        Denies heartburn and reflux symptoms  Genitourinary:  Negative for dysuria.  Skin:  Negative for itching and rash.  Neurological:  Positive for headaches.       Reports headaches that occur every once in a while  Endo/Heme/Allergies:  Positive for environmental allergies.    Physical Exam:  BP 114/70 (BP Location: Right Arm, Patient Position: Sitting, Cuff Size: Normal)   Pulse 79   Temp 98.2 F (36.8 C) (Temporal)   Resp 16   Ht 5\' 2"  (1.575 m)   Wt 148 lb 3.2 oz (67.2 kg)   SpO2 99%   BMI 27.11 kg/m    Physical Exam Constitutional:      Appearance: Normal appearance.  HENT:     Head: Normocephalic and atraumatic.     Comments: Pharynx normal, eyes normal, ears normal, nose normal    Right Ear: Tympanic membrane, ear canal and external ear normal.     Left Ear: Tympanic membrane, ear canal and external ear normal.     Nose: Nose normal.     Mouth/Throat:     Mouth: Mucous membranes  are moist.     Pharynx: Oropharynx is clear.  Eyes:     Conjunctiva/sclera: Conjunctivae normal.  Cardiovascular:     Rate and Rhythm: Regular rhythm.     Heart sounds: Normal heart sounds.  Pulmonary:     Effort: Pulmonary effort is normal.     Breath sounds: Normal breath sounds.     Comments: Lungs clear to auscultation Musculoskeletal:     Cervical back: Neck supple.  Skin:    General: Skin is warm.  Neurological:     Mental Status: She is alert and oriented to person, place, and time.  Psychiatric:        Mood and Affect: Mood normal.        Behavior: Behavior normal.        Thought Content: Thought content normal.        Judgment: Judgment normal.    Diagnostics: FVC 2.15 L, FEV1 1.67 L.  Predicted FVC 2.74 L, predicted FEV1 2.15 L.  Spirometry indicates mild restriction.  Assessment and Plan: 1. Moderate persistent asthma with acute exacerbation   2. Other allergic rhinitis   3. Gastroesophageal reflux disease, unspecified whether esophagitis present   4. Cough     No orders of the defined types were placed in this encounter.   Patient Instructions  Asthma Continue Alvesco 80 mcg-2 puffs once a day with a spacer to prevent cough or wheeze.  Continue montelukast 10 mg once a day to prevent cough or wheeze Continue albuterol 2 puffs once every 4 hours as needed You may use albuterol 2 puffs 5-15 minutes before activity to decrease cough or wheeze  Allergic rhinitis Continue mometasone nasal spray 50 mcg 1-2 sprays each nostril once a day as needed for stuffy nose Contiue Xyzal (levocetirizine) 5 mg once a day as needed for runny nose/itching Continue ipratroprium nasal spray 2 sprays in each nostril twice a day as needed for a runny nose Consider saline nasal rinses as needed for nasal symptoms. Use this before any medicated nasal sprays for best result May use Mucinex 600 mg twice a day as needed for thick postnasal drainage.  Make sure to drink plenty of  fluids  Reflux Continue dietary and lifestyle modifications  Continue esomeprazole once a day as needed to control reflux. Take up to 1 hour before meals for best result  Cough See treatment plan above  Schedule an appointment with your primary care physician to discuss palpitations.  Call the clinic if this treatment plan is not working well for you  Follow up in 5 months or sooner if needed.  Return in about 5 months (around 09/18/2021), or if symptoms worsen or fail to improve.  Thank you for the opportunity to care for this patient.  Please do not hesitate to contact me with questions.  Althea Charon, FNP [facility]

## 2021-05-07 ENCOUNTER — Other Ambulatory Visit: Payer: Self-pay | Admitting: Internal Medicine

## 2021-05-14 ENCOUNTER — Other Ambulatory Visit: Payer: Self-pay | Admitting: Family Medicine

## 2021-06-13 ENCOUNTER — Other Ambulatory Visit: Payer: Self-pay | Admitting: General Surgery

## 2021-06-13 ENCOUNTER — Other Ambulatory Visit: Payer: Self-pay | Admitting: Internal Medicine

## 2021-06-13 DIAGNOSIS — Z1231 Encounter for screening mammogram for malignant neoplasm of breast: Secondary | ICD-10-CM

## 2021-06-26 ENCOUNTER — Ambulatory Visit
Admission: RE | Admit: 2021-06-26 | Discharge: 2021-06-26 | Disposition: A | Discharge status: Home / Self Care | Payer: Medicare PPO | Source: Ambulatory Visit | Attending: Internal Medicine | Admitting: Internal Medicine

## 2021-06-26 ENCOUNTER — Other Ambulatory Visit: Payer: Self-pay

## 2021-06-26 DIAGNOSIS — Z1231 Encounter for screening mammogram for malignant neoplasm of breast: Secondary | ICD-10-CM

## 2021-07-18 DIAGNOSIS — Z23 Encounter for immunization: Secondary | ICD-10-CM | POA: Diagnosis not present

## 2021-07-27 ENCOUNTER — Telehealth: Payer: Self-pay | Admitting: Internal Medicine

## 2021-07-27 DIAGNOSIS — I10 Essential (primary) hypertension: Secondary | ICD-10-CM

## 2021-07-27 DIAGNOSIS — R7303 Prediabetes: Secondary | ICD-10-CM

## 2021-07-27 DIAGNOSIS — Z0000 Encounter for general adult medical examination without abnormal findings: Secondary | ICD-10-CM

## 2021-07-27 DIAGNOSIS — E7849 Other hyperlipidemia: Secondary | ICD-10-CM

## 2021-07-27 NOTE — Telephone Encounter (Signed)
Patient is requesting orders for her lab work for her physical on 08/01/2021.   Please advise.

## 2021-07-27 NOTE — Telephone Encounter (Signed)
ordered

## 2021-07-27 NOTE — Telephone Encounter (Signed)
Appointment made

## 2021-07-31 ENCOUNTER — Encounter: Payer: Self-pay | Admitting: Internal Medicine

## 2021-07-31 NOTE — Patient Instructions (Addendum)
Medications changes include :   none    Please followup in 6 months   Health Maintenance, Female Adopting a healthy lifestyle and getting preventive care are important in promoting health and wellness. Ask your health care provider about: The right schedule for you to have regular tests and exams. Things you can do on your own to prevent diseases and keep yourself healthy. What should I know about diet, weight, and exercise? Eat a healthy diet  Eat a diet that includes plenty of vegetables, fruits, low-fat dairy products, and lean protein. Do not eat a lot of foods that are high in solid fats, added sugars, or sodium. Maintain a healthy weight Body mass index (BMI) is used to identify weight problems. It estimates body fat based on height and weight. Your health care provider can help determine your BMI and help you achieve or maintain a healthy weight. Get regular exercise Get regular exercise. This is one of the most important things you can do for your health. Most adults should: Exercise for at least 150 minutes each week. The exercise should increase your heart rate and make you sweat (moderate-intensity exercise). Do strengthening exercises at least twice a week. This is in addition to the moderate-intensity exercise. Spend less time sitting. Even light physical activity can be beneficial. Watch cholesterol and blood lipids Have your blood tested for lipids and cholesterol at 67 years of age, then have this test every 5 years. Have your cholesterol levels checked more often if: Your lipid or cholesterol levels are high. You are older than 67 years of age. You are at high risk for heart disease. What should I know about cancer screening? Depending on your health history and family history, you may need to have cancer screening at various ages. This may include screening for: Breast cancer. Cervical cancer. Colorectal cancer. Skin cancer. Lung cancer. What should I know  about heart disease, diabetes, and high blood pressure? Blood pressure and heart disease High blood pressure causes heart disease and increases the risk of stroke. This is more likely to develop in people who have high blood pressure readings, are of African descent, or are overweight. Have your blood pressure checked: Every 3-5 years if you are 75-72 years of age. Every year if you are 71 years old or older. Diabetes Have regular diabetes screenings. This checks your fasting blood sugar level. Have the screening done: Once every three years after age 67 if you are at a normal weight and have a low risk for diabetes. More often and at a younger age if you are overweight or have a high risk for diabetes. What should I know about preventing infection? Hepatitis B If you have a higher risk for hepatitis B, you should be screened for this virus. Talk with your health care provider to find out if you are at risk for hepatitis B infection. Hepatitis C Testing is recommended for: Everyone born from [birth year] through 1967. Anyone with known risk factors for hepatitis C. Sexually transmitted infections (STIs) Get screened for STIs, including gonorrhea and chlamydia, if: You are sexually active and are younger than 67 years of age. You are older than 67 years of age and your health care provider tells you that you are at risk for this type of infection. Your sexual activity has changed since you were last screened, and you are at increased risk for chlamydia or gonorrhea. Ask your health care provider if you are at risk. Ask your health care  provider about whether you are at high risk for HIV. Your health care provider may recommend a prescription medicine to help prevent HIV infection. If you choose to take medicine to prevent HIV, you should first get tested for HIV. You should then be tested every 3 months for as long as you are taking the medicine. Pregnancy If you are about to stop having your period  (premenopausal) and you may become pregnant, seek counseling before you get pregnant. Take 400 to 800 micrograms (mcg) of folic acid every day if you become pregnant. Ask for birth control (contraception) if you want to prevent pregnancy. Osteoporosis and menopause Osteoporosis is a disease in which the bones lose minerals and strength with aging. This can result in bone fractures. If you are 7 years old or older, or if you are at risk for osteoporosis and fractures, ask your health care provider if you should: Be screened for bone loss. Take a calcium or vitamin D supplement to lower your risk of fractures. Be given hormone replacement therapy (HRT) to treat symptoms of menopause. Follow these instructions at home: Lifestyle Do not use any products that contain nicotine or tobacco, such as cigarettes, e-cigarettes, and chewing tobacco. If you need help quitting, ask your health care provider. Do not use street drugs. Do not share needles. Ask your health care provider for help if you need support or information about quitting drugs. Alcohol use Do not drink alcohol if: Your health care provider tells you not to drink. You are pregnant, may be pregnant, or are planning to become pregnant. If you drink alcohol: Limit how much you use to 0-1 drink a day. Limit intake if you are breastfeeding. Be aware of how much alcohol is in your drink. In the [place] one drink equals one 12 oz bottle of beer (355 mL), one 5 oz glass of wine (148 mL), or one 1 oz glass of hard liquor (44 mL). General instructions Schedule regular health, dental, and eye exams. Stay current with your vaccines. Tell your health care provider if: You often feel depressed. You have ever been abused or do not feel safe at home. Summary Adopting a healthy lifestyle and getting preventive care are important in promoting health and wellness. Follow your health care provider's instructions about healthy diet, exercising, and  getting tested or screened for diseases. Follow your health care provider's instructions on monitoring your cholesterol and blood pressure. This information is not intended to replace advice given to you by your health care provider. Make sure you discuss any questions you have with your health care provider. Document Revised: 12/02/2020 Document Reviewed: 09/17/2018 Elsevier Patient Education  2022 [employer].

## 2021-07-31 NOTE — Progress Notes (Signed)
Subjective:    Patient ID: Samantha Shepard, female    DOB: 01-May-1956, 65 y.o.   MRN: [id number]   This visit occurred during the SARS-CoV-2 public health emergency.  Safety protocols were in place, including screening questions prior to the visit, additional usage of staff PPE, and extensive cleaning of exam room while observing appropriate contact time as indicated for disinfecting solutions.    HPI She is here for a physical exam.   She has no concerns-overall she feels well.  Medications and allergies reviewed with patient and updated if appropriate.  Patient Active Problem List   Diagnosis Date Noted   Upper back pain 11/17/2020   Gastroesophageal reflux disease 08/31/2020   Cough 08/31/2020   Tendinitis 08/15/2020   Sleep difficulties 07/29/2020   Mild intermittent asthma without complication 37/16/9678   History of epistaxis 07/14/2019   Temporomandibular joint (TMJ) pain 06/16/2018   Anxiety 06/16/2018   Cervical adenopathy 06/16/2018   Jaw deformity 06/16/2018   Prediabetes 08/01/2017   Tingling of both feet 04/18/2016   Osteopenia 01/15/2016   Mild persistent asthma 01/10/2016   Hyperlipidemia 08/13/2008   Migraine headache 09/22/2007   Hypertension 09/22/2007   Allergic rhinitis 05/26/2007    Current Outpatient Medications on File Prior to Visit  Medication Sig Dispense Refill   albuterol (VENTOLIN HFA) 108 (90 Base) MCG/ACT inhaler INHALE 2 PUFFS INTO THE LUNGS EVERY 4 (FOUR) HOURS AS NEEDED FOR WHEEZING OR SHORTNESS OF BREATH. 18 g 1   cholecalciferol (VITAMIN D3) 25 MCG (1000 UT) tablet Take 1,000 Units by mouth daily.     ciclesonide (ALVESCO) 80 MCG/ACT inhaler INHALE 2 PUFFS INTO LUNGS BY MOUTH TWICE DAILY. USE SPACER. 12.2 g 5   clonazePAM (KLONOPIN) 0.5 MG tablet clonazepam 0.5 mg tablet  TAKE 1 TABLET BY MOUTH AT BEDTIME AS NEEDED FOR ANXIETY OR TMJ     estradiol-norethindrone (ACTIVELLA) 1-0.5 MG tablet Take 1 mg by mouth every other  day.     gabapentin (NEURONTIN) 100 MG capsule TAKE 1-3 CAPSULES (100-300 MG TOTAL) BY MOUTH AT BEDTIME. 90 capsule 3   levocetirizine (XYZAL) 5 MG tablet Take 1 tablet once a day as needed for runny nose/itching. 30 tablet 5   losartan-hydrochlorothiazide (HYZAAR) 100-25 MG tablet TAKE 1 TABLET BY MOUTH DAILY 90 tablet 3   magnesium 30 MG tablet Take 30 mg by mouth 2 (two) times daily.     mometasone (ASMANEX, 120 METERED DOSES,) 220 MCG/ACT inhaler Asmanex Twisthaler 220 mcg/actuation(120 doses) breath activated inhlr  TWO PUFFS ONCE A DAY TO PREVENT COUGH OR WHEEZE.     mometasone (NASONEX) 50 MCG/ACT nasal spray 1-2 SPRAY EACH NOSTRIL ONCEF A DAY AS NEEDED FOR STUFFY NOSE. 17 each 5   montelukast (SINGULAIR) 10 MG tablet Take 1 tablet once a day 90 tablet 1   Multiple Vitamin (MULTIVITAMIN WITH MINERALS) TABS tablet Take 1 tablet by mouth daily.     No current facility-administered medications on file prior to visit.    Past Medical History:  Diagnosis Date   Allergy    dust, cats   Anxiety    Asthma    controlled   Hyperlipidemia    Hypertension    Left breast mass    Migraines     Past Surgical History:  Procedure Laterality Date   BREAST EXCISIONAL BIOPSY Left 06/2019   benign   BREAST LUMPECTOMY WITH RADIOACTIVE SEED LOCALIZATION Left 07/08/2019   Procedure: LEFT BREAST LUMPECTOMY WITH RADIOACTIVE SEED LOCALIZATION;  Surgeon: Marlou Starks,  Sena Hitch, MD;  Location: [facility];  Service: General;  Laterality: Left;   COLONOSCOPY  2009   neg; [facility]   thyroid cyst aspirated     TONSILLECTOMY      Social History   Socioeconomic History   Marital status: Married    Spouse name: Not on file   Number of children: 0   Years of education: 16   Highest education level: Not on file  Occupational History   Occupation: retired  Tobacco Use   Smoking status: Never   Smokeless tobacco: Never  Vaping Use   Vaping Use: Never used  Substance and Sexual Activity    Alcohol use: Yes    Alcohol/week: 7.0 standard drinks    Types: 7 Glasses of wine per week    Comment: wine    Drug use: No   Sexual activity: Not on file  Other Topics Concern   Not on file  Social History Narrative   Exercise: regular      Right handed      One story home   Social Determinants of Health   Financial Resource Strain: Not on file  Food Insecurity: Not on file  Transportation Needs: Not on file  Physical Activity: Not on file  Stress: Not on file  Social Connections: Not on file    Family History  Problem Relation Age of Onset   Heart disease Mother        CABG 3 vessel @ 53   Hypertension Mother    Hyperlipidemia Mother    Diabetes Mother    Stroke Mother 50   Prostate cancer Father    Hypertension Father    Breast cancer Sister    Hyperlipidemia Sister    Breast cancer Paternal Aunt        40's or 68's   Allergic rhinitis Neg Hx    Asthma Neg Hx    Angioedema Neg Hx    Eczema Neg Hx    Immunodeficiency Neg Hx    Urticaria Neg Hx     Review of Systems  Constitutional:  Negative for chills and fever.  Eyes:  Negative for visual disturbance.  Respiratory:  Positive for cough (allergies). Negative for shortness of breath and wheezing.   Cardiovascular:  Negative for chest pain, palpitations and leg swelling.  Gastrointestinal:  Negative for abdominal pain, blood in stool, constipation, diarrhea and nausea.       No gerd  Genitourinary:  Negative for dysuria and hematuria.  Musculoskeletal:  Positive for back pain (standing long time). Negative for arthralgias.  Skin:  Negative for color change and rash.  Neurological:  Negative for dizziness, light-headedness, numbness and headaches.  Psychiatric/Behavioral:  Positive for sleep disturbance. Negative for dysphoric mood. The patient is nervous/anxious.       Objective:   Vitals:   08/01/21 0953  BP: 134/80  Pulse: 83  Temp: 98.1 F (36.7 C)  SpO2: 99%   Filed Weights   08/01/21  0953  Weight: 150 lb (68 kg)   Body mass index is 27.44 kg/m.  BP Readings from Last 3 Encounters:  08/01/21 134/80  04/18/21 114/70  02/14/21 136/72    Wt Readings from Last 3 Encounters:  08/01/21 150 lb (68 kg)  04/18/21 148 lb 3.2 oz (67.2 kg)  02/14/21 154 lb 9.6 oz (70.1 kg)     Physical Exam Constitutional: She appears well-developed and well-nourished. No distress.  HENT:  Head: Normocephalic and atraumatic.  Right Ear: External  ear normal. Normal ear canal and TM Left Ear: External ear normal.  Normal ear canal and TM Mouth/Throat: Oropharynx is clear and moist.  Eyes: Conjunctivae and EOM are normal.  Neck: Neck supple. No tracheal deviation present. No thyromegaly present.  No carotid bruit  Cardiovascular: Normal rate, regular rhythm and normal heart sounds.   No murmur heard.  No edema. Pulmonary/Chest: Effort normal and breath sounds normal. No respiratory distress. She has no wheezes. She has no rales.  Breast: deferred   Abdominal: Soft. She exhibits no distension. There is no tenderness.  Lymphadenopathy: She has no cervical adenopathy.  Skin: Skin is warm and dry. She is not diaphoretic.  Psychiatric: She has a normal mood and affect. Her behavior is normal.     Lab Results  Component Value Date   WBC 7.4 12/09/2018   HGB 13.8 12/09/2018   HCT 40.4 12/09/2018   PLT 249.0 12/09/2018   GLUCOSE 100 (H) 02/14/2021   CHOL 199 02/14/2021   TRIG 207.0 (H) 02/14/2021   HDL 42.90 02/14/2021   LDLDIRECT 133.0 02/14/2021   LDLCALC 142 (H) 12/09/2018   ALT 27 02/14/2021   AST 19 02/14/2021   NA 136 02/14/2021   K 3.8 02/14/2021   CL 100 02/14/2021   CREATININE 0.84 02/14/2021   BUN 19 02/14/2021   CO2 27 02/14/2021   TSH 1.14 12/09/2018   HGBA1C 5.9 02/14/2021         Assessment & Plan:   Physical exam: Screening blood work  ordered Exercise  regular - personal trainer 3/week Weight  ok for age Substance abuse  none   Reviewed  recommended immunizations.   Health Maintenance  Topic Date Due   Pneumonia Vaccine 30+ Years old (1 - PCV) Never done   TETANUS/TDAP  Never done   Zoster Vaccines- Shingrix (1 of 2) Never done   COLONOSCOPY (Pts 45-22yrs Insurance coverage will need to be confirmed)  09/24/2018   COVID-19 Vaccine (4 - Booster for Pfizer series) 09/07/2020   INFLUENZA VACCINE  05/08/2021   DEXA SCAN  02/15/2023   MAMMOGRAM  06/27/2023   Hepatitis C Screening  Completed   HPV VACCINES  Aged Out          See Problem List for Assessment and Plan of chronic medical problems.

## 2021-08-01 ENCOUNTER — Other Ambulatory Visit: Payer: Self-pay

## 2021-08-01 ENCOUNTER — Ambulatory Visit (INDEPENDENT_AMBULATORY_CARE_PROVIDER_SITE_OTHER): Payer: Medicare PPO | Admitting: Internal Medicine

## 2021-08-01 ENCOUNTER — Other Ambulatory Visit: Payer: Medicare PPO

## 2021-08-01 VITALS — BP 134/80 | HR 83 | Temp 98.1°F | Ht 62.0 in | Wt 150.0 lb

## 2021-08-01 DIAGNOSIS — J309 Allergic rhinitis, unspecified: Secondary | ICD-10-CM

## 2021-08-01 DIAGNOSIS — M85852 Other specified disorders of bone density and structure, left thigh: Secondary | ICD-10-CM

## 2021-08-01 DIAGNOSIS — R7303 Prediabetes: Secondary | ICD-10-CM | POA: Diagnosis not present

## 2021-08-01 DIAGNOSIS — Z0000 Encounter for general adult medical examination without abnormal findings: Secondary | ICD-10-CM | POA: Diagnosis not present

## 2021-08-01 DIAGNOSIS — I10 Essential (primary) hypertension: Secondary | ICD-10-CM

## 2021-08-01 DIAGNOSIS — M85851 Other specified disorders of bone density and structure, right thigh: Secondary | ICD-10-CM | POA: Diagnosis not present

## 2021-08-01 DIAGNOSIS — M26622 Arthralgia of left temporomandibular joint: Secondary | ICD-10-CM | POA: Diagnosis not present

## 2021-08-01 DIAGNOSIS — E7849 Other hyperlipidemia: Secondary | ICD-10-CM

## 2021-08-01 DIAGNOSIS — G479 Sleep disorder, unspecified: Secondary | ICD-10-CM | POA: Diagnosis not present

## 2021-08-01 DIAGNOSIS — F419 Anxiety disorder, unspecified: Secondary | ICD-10-CM

## 2021-08-01 DIAGNOSIS — Z1211 Encounter for screening for malignant neoplasm of colon: Secondary | ICD-10-CM

## 2021-08-01 DIAGNOSIS — J4530 Mild persistent asthma, uncomplicated: Secondary | ICD-10-CM | POA: Diagnosis not present

## 2021-08-01 LAB — LDL CHOLESTEROL, DIRECT: Direct LDL: 149 mg/dL

## 2021-08-01 LAB — COMPREHENSIVE METABOLIC PANEL
ALT: 20 U/L (ref 0–35)
AST: 17 U/L (ref 0–37)
Albumin: 4.4 g/dL (ref 3.5–5.2)
Alkaline Phosphatase: 55 U/L (ref 39–117)
BUN: 18 mg/dL (ref 6–23)
CO2: 26 mEq/L (ref 19–32)
Calcium: 10.3 mg/dL (ref 8.4–10.5)
Chloride: 99 mEq/L (ref 96–112)
Creatinine, Ser: 0.86 mg/dL (ref 0.40–1.20)
GFR: 70.06 mL/min (ref >60.00)
Glucose, Bld: 100 mg/dL — ABNORMAL HIGH (ref 70–99)
Potassium: 4 mEq/L (ref 3.5–5.1)
Sodium: 135 mEq/L (ref 135–145)
Total Bilirubin: 0.7 mg/dL (ref 0.2–1.2)
Total Protein: 7.5 g/dL (ref 6.0–8.3)

## 2021-08-01 LAB — CBC WITH DIFFERENTIAL/PLATELET
Basophils Absolute: 0 10*3/uL (ref 0.0–0.1)
Basophils Relative: 0.7 % (ref 0.0–3.0)
Eosinophils Absolute: 0.2 10*3/uL (ref 0.0–0.7)
Eosinophils Relative: 3.4 % (ref 0.0–5.0)
HCT: 39.7 % (ref 36.0–46.0)
Hemoglobin: 13.8 g/dL (ref 12.0–15.0)
Lymphocytes Relative: 22.4 % (ref 12.0–46.0)
Lymphs Abs: 1.4 10*3/uL (ref 0.7–4.0)
MCHC: 34.8 g/dL (ref 30.0–36.0)
MCV: 99.7 fl (ref 78.0–100.0)
Monocytes Absolute: 0.4 10*3/uL (ref 0.1–1.0)
Monocytes Relative: 7 % (ref 3.0–12.0)
Neutro Abs: 4.2 10*3/uL (ref 1.4–7.7)
Neutrophils Relative %: 66.5 % (ref 43.0–77.0)
Platelets: 268 10*3/uL (ref 150.0–400.0)
RBC: 3.98 Mil/uL (ref 3.87–5.11)
RDW: 12.3 % (ref 11.5–15.5)
WBC: 6.3 10*3/uL (ref 4.0–10.5)

## 2021-08-01 LAB — LIPID PANEL
Cholesterol: 222 mg/dL — ABNORMAL HIGH (ref 0–200)
HDL: 46.2 mg/dL (ref >39.00)
NonHDL: 175.43
Total CHOL/HDL Ratio: 5
Triglycerides: 234 mg/dL — ABNORMAL HIGH (ref 0.0–149.0)
VLDL: 46.8 mg/dL — ABNORMAL HIGH (ref 0.0–40.0)

## 2021-08-01 LAB — HEMOGLOBIN A1C: Hgb A1c MFr Bld: 5.9 % (ref 4.6–6.5)

## 2021-08-01 LAB — TSH: TSH: 1.42 u[IU]/mL (ref 0.35–5.50)

## 2021-08-01 NOTE — Assessment & Plan Note (Signed)
Chronic Regular exercise and healthy diet encouraged Check lipid panel  Diet controlled

## 2021-08-01 NOTE — Assessment & Plan Note (Signed)
Chronic, intermittent

## 2021-08-01 NOTE — Assessment & Plan Note (Signed)
Chronic Check a1c Low sugar / carb diet Stressed regular exercise Work on weight loss

## 2021-08-01 NOTE — Assessment & Plan Note (Signed)
Chronic DEXA up-to-date Continue regular exercise Continue vitamin D, multivitamin 

## 2021-08-01 NOTE — Assessment & Plan Note (Signed)
Chronic Controlled Continue Singulair 10 mg nightly, Nasonex and Xyzal 5 mg nightly

## 2021-08-01 NOTE — Assessment & Plan Note (Signed)
Chronic Blood pressure well controlled CMP Continue Hyzaar 100-25 mg daily 

## 2021-08-01 NOTE — Assessment & Plan Note (Signed)
Chronic Currently taking gabapentin 100-300 mg at bedtime Trazodone was not effective Clonazepam worked, but would like to avoid that long-term Continue regular exercise Good sleep hygiene

## 2021-08-01 NOTE — Assessment & Plan Note (Signed)
Chronic Controlled Continue Alvesco 2 puffs once daily and albuterol inhaler as needed Continue Singulair 10 mg nightly

## 2021-08-01 NOTE — Assessment & Plan Note (Signed)
Chronic Anxiety mostly regarding her stepson Currently not taking any medication, but anxiety fairly controllable Has had some difficulty sleeping Continue regular exercise

## 2021-08-07 ENCOUNTER — Other Ambulatory Visit: Payer: Self-pay | Admitting: Internal Medicine

## 2021-08-09 ENCOUNTER — Telehealth: Payer: Self-pay

## 2021-08-09 NOTE — Telephone Encounter (Signed)
[facility] sent over a change of medication for Samantha Shepard because it's not covered by her insurance. The preferred alternative is Arnuity. Please advise.

## 2021-08-10 ENCOUNTER — Other Ambulatory Visit: Payer: Self-pay | Admitting: Family

## 2021-08-10 MED ORDER — ARNUITY ELLIPTA 100 MCG/ACT IN AEPB: INHALATION_SPRAY | RESPIRATORY_TRACT | 2 refills | Status: DC

## 2021-08-10 NOTE — Telephone Encounter (Signed)
Please change to Arnuiti Ellipta 100 mcg one puff once a day.#1 with 2 refills. Please inform Samantha Shepard of the change.

## 2021-08-10 NOTE — Telephone Encounter (Signed)
Left message that the Arnuiti Ellipta 100 mcg take one puff once a day was sent into [facility].

## 2021-08-10 NOTE — Telephone Encounter (Signed)
Arnuity 100 mcg x 1 with 2 refills sent to [facility] per Althea Charon, FNP.

## 2021-08-11 NOTE — Telephone Encounter (Signed)
Why is it saying Plan limitation? I thought yesterday it said it was perfered. What is preferred?

## 2021-08-14 NOTE — Telephone Encounter (Signed)
Preferred medication that doesn't require a PA  for this patient is Arnuity and Flovent. Please advise.

## 2021-08-14 NOTE — Telephone Encounter (Signed)
Arnuity was sent on 08/10/21 so why are we getting this?

## 2021-08-16 DIAGNOSIS — Z1211 Encounter for screening for malignant neoplasm of colon: Secondary | ICD-10-CM | POA: Diagnosis not present

## 2021-08-16 NOTE — Telephone Encounter (Signed)
Not sure, the PA was for Alvesco 80 mcg

## 2021-08-16 NOTE — Telephone Encounter (Signed)
So I can ignore this right?

## 2021-08-22 DIAGNOSIS — H2513 Age-related nuclear cataract, bilateral: Secondary | ICD-10-CM | POA: Diagnosis not present

## 2021-08-22 DIAGNOSIS — H11153 Pinguecula, bilateral: Secondary | ICD-10-CM | POA: Diagnosis not present

## 2021-08-22 DIAGNOSIS — H43393 Other vitreous opacities, bilateral: Secondary | ICD-10-CM | POA: Diagnosis not present

## 2021-08-23 LAB — COLOGUARD: COLOGUARD: NEGATIVE

## 2021-09-05 DIAGNOSIS — Z23 Encounter for immunization: Secondary | ICD-10-CM | POA: Diagnosis not present

## 2021-09-15 ENCOUNTER — Other Ambulatory Visit: Payer: Self-pay | Admitting: Internal Medicine

## 2021-09-21 ENCOUNTER — Other Ambulatory Visit: Payer: Self-pay | Admitting: Internal Medicine

## 2021-10-18 ENCOUNTER — Other Ambulatory Visit: Payer: Self-pay

## 2021-10-18 ENCOUNTER — Encounter: Payer: Self-pay | Admitting: Family Medicine

## 2021-10-18 ENCOUNTER — Telehealth: Payer: Self-pay

## 2021-10-18 ENCOUNTER — Ambulatory Visit: Payer: Medicare PPO | Admitting: Family Medicine

## 2021-10-18 VITALS — BP 126/74 | HR 77 | Temp 98.0°F | Resp 17 | Wt 152.1 lb

## 2021-10-18 DIAGNOSIS — K219 Gastro-esophageal reflux disease without esophagitis: Secondary | ICD-10-CM | POA: Diagnosis not present

## 2021-10-18 DIAGNOSIS — M26622 Arthralgia of left temporomandibular joint: Secondary | ICD-10-CM

## 2021-10-18 DIAGNOSIS — J4530 Mild persistent asthma, uncomplicated: Secondary | ICD-10-CM | POA: Diagnosis not present

## 2021-10-18 DIAGNOSIS — J3089 Other allergic rhinitis: Secondary | ICD-10-CM

## 2021-10-18 DIAGNOSIS — J4540 Moderate persistent asthma, uncomplicated: Secondary | ICD-10-CM

## 2021-10-18 MED ORDER — MONTELUKAST SODIUM 10 MG PO TABS: ORAL_TABLET | ORAL | 1 refills | Status: DC

## 2021-10-18 NOTE — Patient Instructions (Addendum)
Asthma Increase Alvesco 80 to 2 puffs twice a day for the next 2 weeks, then return to 2 puffs once a day with a spacer to prevent cough or wheeze.  Continue montelukast 10 mg once a day to prevent cough or wheeze Continue albuterol 2 puffs once every 4 hours as needed You may use albuterol 2 puffs 5-15 minutes before activity to decrease cough or wheeze  Allergic rhinitis Continue mometasone nasal spray 50 mcg 1-2 sprays each nostril once a day as needed for stuffy nose Contiue Xyzal (levocetirizine) 5 mg once a day as needed for runny nose/itching Continue ipratroprium nasal spray 2 sprays in each nostril twice a day as needed for a runny nose Consider saline nasal rinses as needed for nasal symptoms. Use this before any medicated nasal sprays for best result  Reflux Continue dietary and lifestyle modifications  Continue esomeprazole once a day as needed to control reflux. Take up to 1 hour before meals for best result  Facial pain/TMJ Follow up with your dentist for evaluation of left sided facial pain  Call the clinic if this treatment plan is not working well for you  Follow up in 3 months or sooner if needed.

## 2021-10-18 NOTE — Telephone Encounter (Signed)
Patient was seen today by Webb Silversmith. Anne asked the patient to call our office after she went to the dentist. The dental report was that she has an abscess tooth where she had her root canal and will have to have her tooth extracted. She will then have an implant put in. This will not happen for another month. The dentist did not give her any antibiotic or pain medication for her abscess tooth. She is going to call her dentist back bc she feels pretty uncomfortable.

## 2021-10-18 NOTE — Telephone Encounter (Signed)
Thank you for the update!

## 2021-10-18 NOTE — Progress Notes (Signed)
[place] [id number] Dept: [phone]  FOLLOW UP NOTE  Patient ID: Samantha Shepard [facility], female    DOB: 07/24/1956  Age: 66 y.o. MRN: [id number] Date of Office Visit: 10/18/2021  Assessment  Chief Complaint: Asthma, Follow-up (Pt states she have been doing well, she started exercising, her GERD have been flaring up more she tries to remember to take her medication as much as she can.), and Allergic Rhinitis   HPI Samantha Shepard is a 66 year old female who presents the clinic for a follow-up visit.  She was last seen in this clinic on 04/18/2021 by Althea Charon, FNP, for evaluation of asthma, cough, allergic rhinitis, and reflux.  At today's visit, she reports her asthma has been moderately well controlled with symptoms that began over the last few weeks including shortness of breath with activity, wheeze over the last few days, and cough producing light yellow mucus in the morning and intermittent dry cough in the evening.  She continues montelukast 10 mg once a day, Alvesco 80-2 puffs in the morning, and uses albuterol about once a month with relief of symptoms.  Of note, absolute eosinophils were slightly elevated on 08/01/2021.  Allergic rhinitis is reported as moderately well controlled with symptoms including clear rhinorrhea with occasional flakes of dried blood, occasional nasal congestion, sneezing, and frequent postnasal drainage.  She continues saline nasal gel frequently, Xyzal 2.5 mg as needed, and Nasacort as needed.  She is not currently using ipratropium.  Reflux is reported as moderately well controlled with heartburn occurring 1-2 nights a week and is dependent on food eaten and timing of meals.  She continues to use esomeprazole most days, however, does forget this medication frequently.  She reports a relatively new symptom of left-sided facial pain that began several months ago and has worsened over the last 1 to 2 weeks.  She reports this pain is  intermittent, can be sharp or dull, occasionally occurs under her left eye, and occasionally hurts to bite down.  She reports that she has had this pain on a previous occasion and needed a root canal. She also reports attending a physical therapy program for decreasing facial pain due to TMJ which included mouth exercise and facial massage.  She has a history of using invisible braces and she currently wears a mouthguard at night to prevent nighttime tooth grinding.  She has an appointment with her dentist later today.  Her current medications are listed in the chart.  Drug Allergies:  Allergies  Allergen Reactions   Sulfonamide Derivatives     REACTION: rash   Codeine     REACTION: Excitability--can't sleep    Physical Exam: BP 126/74    Pulse 77    Temp 98 F (36.7 C) (Temporal)    Resp 17    Wt 152 lb 1.6 oz (69 kg)    SpO2 100%    BMI 27.82 kg/m    Physical Exam Vitals reviewed.  Constitutional:      Appearance: Normal appearance.  HENT:     Head: Normocephalic and atraumatic.     Right Ear: Tympanic membrane normal.     Left Ear: Tympanic membrane normal.     Nose:     Comments: Bilateral nares normal.  Pharynx normal.  Ears normal.  Eyes normal.    Mouth/Throat:     Pharynx: Oropharynx is clear.     Comments: No lymph node swelling noted preauricular, postauricular, parotid, tonsillar, or submandibular. Eyes:  Conjunctiva/sclera: Conjunctivae normal.  Cardiovascular:     Rate and Rhythm: Normal rate and regular rhythm.     Heart sounds: Normal heart sounds. No murmur heard. Pulmonary:     Effort: Pulmonary effort is normal.     Breath sounds: Normal breath sounds.     Comments: Lungs clear to auscultation Musculoskeletal:        General: Normal range of motion.     Cervical back: Normal range of motion and neck supple.  Skin:    General: Skin is warm and dry.  Neurological:     Mental Status: She is alert and oriented to person, place, and time.  Psychiatric:         Mood and Affect: Mood normal.        Behavior: Behavior normal.        Thought Content: Thought content normal.        Judgment: Judgment normal.    Diagnostics: FVC 1.91, FEV1 1.43.  Predicted FVC 2.73, predicted FEV1 2.14.  Spirometry indicates moderate restriction.  Postbronchodilator FVC 1.89, FEV1 1.57.  Postbronchodilator spirometry indicates mild restriction with 10% improvement in FEV1.  Assessment and Plan: 1. Not well controlled moderate persistent asthma   2. Other allergic rhinitis   3. Gastroesophageal reflux disease, unspecified whether esophagitis present   4. Arthralgia of left temporomandibular joint     Meds ordered this encounter  Medications   montelukast (SINGULAIR) 10 MG tablet    Sig: Take 1 tablet once a day    Dispense:  90 tablet    Refill:  1    Please dispense 90 day supply.    Patient Instructions  Asthma Increase Alvesco 80 to 2 puffs twice a day for the next 2 weeks, then return to 2 puffs once a day with a spacer to prevent cough or wheeze.  Continue montelukast 10 mg once a day to prevent cough or wheeze Continue albuterol 2 puffs once every 4 hours as needed You may use albuterol 2 puffs 5-15 minutes before activity to decrease cough or wheeze  Allergic rhinitis Continue mometasone nasal spray 50 mcg 1-2 sprays each nostril once a day as needed for stuffy nose Contiue Xyzal (levocetirizine) 5 mg once a day as needed for runny nose/itching Continue ipratroprium nasal spray 2 sprays in each nostril twice a day as needed for a runny nose Consider saline nasal rinses as needed for nasal symptoms. Use this before any medicated nasal sprays for best result  Reflux Continue dietary and lifestyle modifications  Continue esomeprazole once a day as needed to control reflux. Take up to 1 hour before meals for best result  Facial pain/TMJ Follow up with your dentist for evaluation of left sided facial pain  Call the clinic if this treatment plan  is not working well for you  Follow up in 3 months or sooner if needed.   Return in about 3 months (around 01/16/2022), or if symptoms worsen or fail to improve.    Thank you for the opportunity to care for this patient.  Please do not hesitate to contact me with questions.  Gareth Morgan, FNP [facility]

## 2021-11-28 ENCOUNTER — Other Ambulatory Visit: Payer: Self-pay | Admitting: Internal Medicine

## 2021-11-28 DIAGNOSIS — U071 COVID-19: Secondary | ICD-10-CM | POA: Diagnosis not present

## 2022-01-05 DIAGNOSIS — H1131 Conjunctival hemorrhage, right eye: Secondary | ICD-10-CM | POA: Diagnosis not present

## 2022-01-26 ENCOUNTER — Other Ambulatory Visit: Payer: Self-pay | Admitting: Internal Medicine

## 2022-01-29 ENCOUNTER — Encounter: Payer: Self-pay | Admitting: Internal Medicine

## 2022-01-29 NOTE — Patient Instructions (Addendum)
? ? ? ?  Blood work was ordered.   ? ? ? ?Medications changes include :   none ? ? ? ?A coronary artery calcium ct scan was ordered.  ? ? ?Return in about 6 months (around 08/01/2022) for Physical Exam. ? ?

## 2022-01-29 NOTE — Progress Notes (Signed)
? ? ? ? ?Subjective:  ? ? Patient ID: Samantha Shepard, female    DOB: 1955-11-09, 66 y.o.   MRN: [id number] ? ?This visit occurred during the SARS-CoV-2 public health emergency.  Safety protocols were in place, including screening questions prior to the visit, additional usage of staff PPE, and extensive cleaning of exam room while observing appropriate contact time as indicated for disinfecting solutions.   ? ? ?HPI ?Samantha Shepard is here for follow up of her chronic medical problems, including htn, hld, anxiety, prediabetes ? ?She is exercising regularly.  She works with a Physiological scientist once a week.   ? ? ?Medications and allergies reviewed with patient and updated if appropriate. ? ?Current Outpatient Medications on File Prior to Visit  ?Medication Sig Dispense Refill  ? albuterol (VENTOLIN HFA) 108 (90 Base) MCG/ACT inhaler INHALE 2 PUFFS INTO THE LUNGS EVERY 4 (FOUR) HOURS AS NEEDED FOR WHEEZING OR SHORTNESS OF BREATH. 18 g 1  ? cholecalciferol (VITAMIN D3) 25 MCG (1000 UT) tablet Take 1,000 Units by mouth daily.    ? ciclesonide (ALVESCO) 80 MCG/ACT inhaler INHALE 2 PUFFS INTO LUNGS BY MOUTH TWICE DAILY. USE SPACER. 12.2 g 5  ? clonazePAM (KLONOPIN) 0.5 MG tablet TAKE 1 TABLET BY MOUTH AT BEDTIME AS NEEDED FOR ANXIETY OR TMJ 30 tablet 2  ? estradiol-norethindrone (ACTIVELLA) 1-0.5 MG tablet Take 1 mg by mouth every other day.    ? Fluticasone Furoate (ARNUITY ELLIPTA) 100 MCG/ACT AEPB ONE INHALATION ONCE A DAY TO PREVENT COUGHING OR WHEEZING. 30 each 0  ? gabapentin (NEURONTIN) 100 MG capsule TAKE 1-3 CAPSULES BY MOUTH AT BEDTIME 90 capsule 3  ? levocetirizine (XYZAL) 5 MG tablet Take 1 tablet once a day as needed for runny nose/itching. 30 tablet 5  ? losartan-hydrochlorothiazide (HYZAAR) 100-25 MG tablet TAKE 1 TABLET BY MOUTH DAILY 90 tablet 3  ? magnesium 30 MG tablet Take 30 mg by mouth 2 (two) times daily.    ? montelukast (SINGULAIR) 10 MG tablet Take 1 tablet once a day 90 tablet 1  ? Multiple  Vitamin (MULTIVITAMIN WITH MINERALS) TABS tablet Take 1 tablet by mouth daily.    ? ?No current facility-administered medications on file prior to visit.  ? ? ? ?Review of Systems  ?Constitutional:  Negative for chills and fever.  ?Respiratory:  Positive for cough (allergy related) and wheezing (rare). Negative for shortness of breath.   ?Cardiovascular:  Positive for chest pain (at rest, intermittent). Negative for palpitations and leg swelling.  ?Gastrointestinal:  Negative for nausea.  ?     Occ gerd  ?Neurological:  Positive for light-headedness and headaches.  ? ?   ?Objective:  ? ?Vitals:  ? 01/30/22 1013  ?BP: 120/78  ?Pulse: 71  ?Temp: 98 ?F (36.7 ?C)  ?SpO2: 99%  ? ?BP Readings from Last 3 Encounters:  ?01/30/22 120/78  ?10/18/21 126/74  ?08/01/21 134/80  ? ?Wt Readings from Last 3 Encounters:  ?01/30/22 150 lb (68 kg)  ?10/18/21 152 lb 1.6 oz (69 kg)  ?08/01/21 150 lb (68 kg)  ? ?Body mass index is 27.44 kg/m?. ? ?  ?Physical Exam ?Constitutional:   ?   General: She is not in acute distress. ?   Appearance: Normal appearance.  ?HENT:  ?   Head: Normocephalic and atraumatic.  ?Eyes:  ?   Conjunctiva/sclera: Conjunctivae normal.  ?Cardiovascular:  ?   Rate and Rhythm: Normal rate and regular rhythm.  ?   Heart sounds: Normal heart sounds. No murmur  heard. ?Pulmonary:  ?   Effort: Pulmonary effort is normal. No respiratory distress.  ?   Breath sounds: Normal breath sounds. No wheezing.  ?Musculoskeletal:  ?   Cervical back: Neck supple.  ?   Right lower leg: No edema.  ?   Left lower leg: No edema.  ?Lymphadenopathy:  ?   Cervical: No cervical adenopathy.  ?Skin: ?   General: Skin is warm and dry.  ?   Findings: No rash.  ?Neurological:  ?   Mental Status: She is alert. Mental status is at baseline.  ?Psychiatric:     ?   Mood and Affect: Mood normal.     ?   Behavior: Behavior normal.  ? ?   ? ?Lab Results  ?Component Value Date  ? WBC 6.3 08/01/2021  ? HGB 13.8 08/01/2021  ? HCT 39.7 08/01/2021  ? PLT  268.0 08/01/2021  ? GLUCOSE 100 (H) 08/01/2021  ? CHOL 222 (H) 08/01/2021  ? TRIG 234.0 (H) 08/01/2021  ? HDL 46.20 08/01/2021  ? LDLDIRECT 149.0 08/01/2021  ? LDLCALC 142 (H) 12/09/2018  ? ALT 20 08/01/2021  ? AST 17 08/01/2021  ? NA 135 08/01/2021  ? K 4.0 08/01/2021  ? CL 99 08/01/2021  ? CREATININE 0.86 08/01/2021  ? BUN 18 08/01/2021  ? CO2 26 08/01/2021  ? TSH 1.42 08/01/2021  ? HGBA1C 5.9 08/01/2021  ? ? ?The 10-year ASCVD risk score (Arnett DK, et al., 2019) is: 9% ?  Values used to calculate the score: ?    Age: 67 years ?    Sex: Female ?    Is Non-Hispanic African American: No ?    Diabetic: No ?    Tobacco smoker: No ?    Systolic Blood Pressure: 585 mmHg ?    Is BP treated: Yes ?    HDL Cholesterol: 46.2 mg/dL ?    Total Cholesterol: 222 mg/dL ? ? ?Assessment & Plan:  ? ? ?See Problem List for Assessment and Plan of chronic medical problems.  ? ? ?

## 2022-01-30 ENCOUNTER — Ambulatory Visit: Payer: Medicare PPO | Admitting: Internal Medicine

## 2022-01-30 VITALS — BP 120/78 | HR 71 | Temp 98.0°F | Ht 62.0 in | Wt 150.0 lb

## 2022-01-30 DIAGNOSIS — G479 Sleep disorder, unspecified: Secondary | ICD-10-CM

## 2022-01-30 DIAGNOSIS — E7849 Other hyperlipidemia: Secondary | ICD-10-CM | POA: Diagnosis not present

## 2022-01-30 DIAGNOSIS — Z136 Encounter for screening for cardiovascular disorders: Secondary | ICD-10-CM

## 2022-01-30 DIAGNOSIS — F419 Anxiety disorder, unspecified: Secondary | ICD-10-CM | POA: Diagnosis not present

## 2022-01-30 DIAGNOSIS — I10 Essential (primary) hypertension: Secondary | ICD-10-CM | POA: Diagnosis not present

## 2022-01-30 DIAGNOSIS — R7303 Prediabetes: Secondary | ICD-10-CM

## 2022-01-30 LAB — COMPREHENSIVE METABOLIC PANEL
ALT: 20 U/L (ref 0–35)
AST: 20 U/L (ref 0–37)
Albumin: 4.3 g/dL (ref 3.5–5.2)
Alkaline Phosphatase: 52 U/L (ref 39–117)
BUN: 21 mg/dL (ref 6–23)
CO2: 26 mEq/L (ref 19–32)
Calcium: 10 mg/dL (ref 8.4–10.5)
Chloride: 102 mEq/L (ref 96–112)
Creatinine, Ser: 0.99 mg/dL (ref 0.40–1.20)
GFR: 58.97 mL/min — ABNORMAL LOW (ref >60.00)
Glucose, Bld: 107 mg/dL — ABNORMAL HIGH (ref 70–99)
Potassium: 3.7 mEq/L (ref 3.5–5.1)
Sodium: 136 mEq/L (ref 135–145)
Total Bilirubin: 0.4 mg/dL (ref 0.2–1.2)
Total Protein: 7.3 g/dL (ref 6.0–8.3)

## 2022-01-30 LAB — LIPID PANEL
Cholesterol: 230 mg/dL — ABNORMAL HIGH (ref 0–200)
HDL: 50.1 mg/dL (ref >39.00)
NonHDL: 179.96
Total CHOL/HDL Ratio: 5
Triglycerides: 289 mg/dL — ABNORMAL HIGH (ref 0.0–149.0)
VLDL: 57.8 mg/dL — ABNORMAL HIGH (ref 0.0–40.0)

## 2022-01-30 LAB — HEMOGLOBIN A1C: Hgb A1c MFr Bld: 5.9 % (ref 4.6–6.5)

## 2022-01-30 LAB — LDL CHOLESTEROL, DIRECT: Direct LDL: 159 mg/dL

## 2022-01-30 NOTE — Assessment & Plan Note (Signed)
Chronic ?Controlled, Stable ?Continue clonazepam 0.5 mg at bedtime as needed ?

## 2022-01-30 NOTE — Assessment & Plan Note (Signed)
Chronic Check a1c Low sugar / carb diet Stressed regular exercise  

## 2022-01-30 NOTE — Assessment & Plan Note (Addendum)
Chronic ?Continue clonazepam 0.5 mg at bedtime as needed - takes occasionally only ?Continue gabapentin 100-300 mg at bedtime - usually takes 200 mg at night ?

## 2022-01-30 NOTE — Assessment & Plan Note (Signed)
Chronic Blood pressure well controlled CMP Continue losartan-HCTZ 100-25 mg daily 

## 2022-01-30 NOTE — Assessment & Plan Note (Addendum)
Chronic ?Regular exercise and healthy diet encouraged - will try to increase exercise ?Check lipid panel  ?Continue diet control-discussed that she has an elevated risk for cardiovascular disease  - borderline for needing statin but wants to avoid it ?Will have a coronary artery calcium CT to evaluate risk ?

## 2022-03-01 ENCOUNTER — Other Ambulatory Visit: Payer: Self-pay | Admitting: Family Medicine

## 2022-03-01 ENCOUNTER — Other Ambulatory Visit: Payer: Self-pay

## 2022-03-01 MED ORDER — ALBUTEROL SULFATE HFA 108 (90 BASE) MCG/ACT IN AERS: 2.0000 | INHALATION_SPRAY | RESPIRATORY_TRACT | 1 refills | Status: DC | PRN

## 2022-03-02 ENCOUNTER — Other Ambulatory Visit: Payer: Self-pay

## 2022-03-02 MED ORDER — ALBUTEROL SULFATE HFA 108 (90 BASE) MCG/ACT IN AERS: 2.0000 | INHALATION_SPRAY | RESPIRATORY_TRACT | 1 refills | Status: DC | PRN

## 2022-03-14 ENCOUNTER — Ambulatory Visit
Admission: RE | Admit: 2022-03-14 | Discharge: 2022-03-14 | Disposition: A | Discharge status: Home / Self Care | Payer: Self-pay | Source: Ambulatory Visit | Attending: Internal Medicine | Admitting: Internal Medicine

## 2022-03-14 DIAGNOSIS — Z136 Encounter for screening for cardiovascular disorders: Secondary | ICD-10-CM

## 2022-03-17 ENCOUNTER — Encounter: Payer: Self-pay | Admitting: Internal Medicine

## 2022-03-17 DIAGNOSIS — R931 Abnormal findings on diagnostic imaging of heart and coronary circulation: Secondary | ICD-10-CM | POA: Insufficient documentation

## 2022-03-21 DIAGNOSIS — Z01419 Encounter for gynecological examination (general) (routine) without abnormal findings: Secondary | ICD-10-CM | POA: Diagnosis not present

## 2022-03-21 DIAGNOSIS — Z124 Encounter for screening for malignant neoplasm of cervix: Secondary | ICD-10-CM | POA: Diagnosis not present

## 2022-03-27 ENCOUNTER — Other Ambulatory Visit: Payer: Self-pay

## 2022-03-27 MED ORDER — ALVESCO 80 MCG/ACT IN AERS: 1.0000 | INHALATION_SPRAY | Freq: Two times a day (BID) | RESPIRATORY_TRACT | 0 refills | Status: DC

## 2022-03-28 LAB — HM PAP SMEAR: HM Pap smear: NORMAL

## 2022-04-13 ENCOUNTER — Encounter: Payer: Self-pay | Admitting: Internal Medicine

## 2022-04-13 NOTE — Progress Notes (Signed)
Outside notes received. Information abstracted. Notes sent to scan.  

## 2022-04-25 DIAGNOSIS — L820 Inflamed seborrheic keratosis: Secondary | ICD-10-CM | POA: Diagnosis not present

## 2022-06-04 ENCOUNTER — Telehealth: Payer: Self-pay

## 2022-06-04 NOTE — Telephone Encounter (Signed)
LM for patient to call back and schedule Initial Medicare Wellness Visit (AWV-I) at any time with either Nurse Health Advisor or Nurse AWV

## 2022-06-18 ENCOUNTER — Other Ambulatory Visit: Payer: Self-pay | Admitting: Internal Medicine

## 2022-06-18 DIAGNOSIS — Z1231 Encounter for screening mammogram for malignant neoplasm of breast: Secondary | ICD-10-CM

## 2022-06-29 ENCOUNTER — Other Ambulatory Visit: Payer: Self-pay | Admitting: Family Medicine

## 2022-07-13 ENCOUNTER — Ambulatory Visit: Payer: Medicare PPO

## 2022-07-16 ENCOUNTER — Ambulatory Visit
Admission: RE | Admit: 2022-07-16 | Discharge: 2022-07-16 | Disposition: A | Discharge status: Home / Self Care | Payer: Medicare PPO | Source: Ambulatory Visit | Attending: Internal Medicine | Admitting: Internal Medicine

## 2022-07-16 DIAGNOSIS — Z1231 Encounter for screening mammogram for malignant neoplasm of breast: Secondary | ICD-10-CM

## 2022-07-18 ENCOUNTER — Other Ambulatory Visit: Payer: Self-pay | Admitting: Internal Medicine

## 2022-07-18 DIAGNOSIS — R928 Other abnormal and inconclusive findings on diagnostic imaging of breast: Secondary | ICD-10-CM

## 2022-07-28 ENCOUNTER — Ambulatory Visit
Admission: RE | Admit: 2022-07-28 | Discharge: 2022-07-28 | Disposition: A | Discharge status: Home / Self Care | Payer: Medicare PPO | Source: Ambulatory Visit | Attending: Internal Medicine | Admitting: Internal Medicine

## 2022-07-28 ENCOUNTER — Other Ambulatory Visit: Payer: Self-pay | Admitting: Internal Medicine

## 2022-07-28 DIAGNOSIS — N6001 Solitary cyst of right breast: Secondary | ICD-10-CM | POA: Diagnosis not present

## 2022-07-28 DIAGNOSIS — R928 Other abnormal and inconclusive findings on diagnostic imaging of breast: Secondary | ICD-10-CM

## 2022-07-28 DIAGNOSIS — N6310 Unspecified lump in the right breast, unspecified quadrant: Secondary | ICD-10-CM

## 2022-07-28 DIAGNOSIS — R92311 Mammographic fatty tissue density, right breast: Secondary | ICD-10-CM | POA: Diagnosis not present

## 2022-08-06 ENCOUNTER — Encounter: Payer: Self-pay | Admitting: Internal Medicine

## 2022-08-06 NOTE — Progress Notes (Unsigned)
Subjective:    Patient ID: Samantha Shepard, female    DOB: [birth year], 66 y.o.   MRN: [id number]      HPI [name] is here for a Physical exam.   Having some dysuria.  It started a few days ago.  Has no energy.  Does not feel good.  She is not sure if she is not feeling well because of allergies or something a lot of her symptoms are consistent with her seasonal allergies.   Medications and allergies reviewed with patient and updated if appropriate.  Current Outpatient Medications on File Prior to Visit  Medication Sig Dispense Refill   albuterol (VENTOLIN HFA) 108 (90 Base) MCG/ACT inhaler Inhale 2 puffs into the lungs every 4 (four) hours as needed for wheezing or shortness of breath. 18 g 1   ALVESCO 80 MCG/ACT inhaler Inhale 1 puff into the lungs 2 (two) times daily. 1 each 0   cholecalciferol (VITAMIN D3) 25 MCG (1000 UT) tablet Take 1,000 Units by mouth daily.     ciclesonide (ALVESCO) 80 MCG/ACT inhaler INHALE 2 PUFFS INTO LUNGS BY MOUTH TWICE DAILY. USE SPACER. 12.2 g 5   clonazePAM (KLONOPIN) 0.5 MG tablet TAKE 1 TABLET BY MOUTH AT BEDTIME AS NEEDED FOR ANXIETY OR TMJ 30 tablet 2   estradiol-norethindrone (ACTIVELLA) 1-0.5 MG tablet Take 1 mg by mouth every other day.     Fluticasone Furoate (ARNUITY ELLIPTA) 100 MCG/ACT AEPB ONE INHALATION ONCE A DAY TO PREVENT COUGHING OR WHEEZING. 30 each 0   gabapentin (NEURONTIN) 100 MG capsule TAKE 1-3 CAPSULES BY MOUTH AT BEDTIME 90 capsule 3   levocetirizine (XYZAL) 5 MG tablet Take 1 tablet once a day as needed for runny nose/itching. 30 tablet 5   losartan-hydrochlorothiazide (HYZAAR) 100-25 MG tablet TAKE 1 TABLET BY MOUTH DAILY 90 tablet 3   magnesium 30 MG tablet Take 30 mg by mouth 2 (two) times daily.     montelukast (SINGULAIR) 10 MG tablet TAKE 1 TABLET BY MOUTH EVERY DAY 90 tablet 1   Multiple Vitamin (MULTIVITAMIN WITH MINERALS) TABS tablet Take 1 tablet by mouth daily.     No current facility-administered  medications on file prior to visit.    Review of Systems  Constitutional:  Positive for fatigue. Negative for fever.  HENT:  Positive for ear pain (little). Negative for congestion, sinus pain and sore throat (mild scratchy from allergies).   Respiratory:  Positive for cough (allergy related) and shortness of breath. Negative for wheezing.   Cardiovascular:  Negative for chest pain, palpitations and leg swelling.  Gastrointestinal:  Positive for abdominal pain (sometimes a little pain). Negative for blood in stool, constipation, diarrhea and nausea.       Gerd occ  Genitourinary:  Positive for dysuria and frequency. Negative for hematuria and urgency.       No odor or cloudiness  Musculoskeletal:  Positive for back pain. Negative for arthralgias.  Skin:  Negative for rash.  Neurological:  Positive for headaches (allergy related). Negative for light-headedness.  Psychiatric/Behavioral:  Negative for dysphoric mood. The patient is not nervous/anxious.        Objective:   Vitals:   08/07/22 1002  BP: 130/76  Pulse: 72  Temp: 98.2 F (36.8 C)  SpO2: 97%   Filed Weights   08/07/22 1002  Weight: 155 lb (70.3 kg)   Body mass index is 28.35 kg/m.  BP Readings from Last 3 Encounters:  08/07/22 130/76  01/30/22 120/78  10/18/21 126/74  Wt Readings from Last 3 Encounters:  08/07/22 155 lb (70.3 kg)  01/30/22 150 lb (68 kg)  10/18/21 152 lb 1.6 oz (69 kg)       Physical Exam Constitutional: She appears well-developed and well-nourished. No distress.  HENT:  Head: Normocephalic and atraumatic.  Right Ear: External ear normal. Normal ear canal and TM Left Ear: External ear normal.  Normal ear canal and TM Mouth/Throat: Oropharynx is clear and moist.  Eyes: Conjunctivae normal.  Neck: Neck supple. No tracheal deviation present. No thyromegaly present.  No carotid bruit  Cardiovascular: Normal rate, regular rhythm and normal heart sounds.   No murmur heard.  No  edema. Pulmonary/Chest: Effort normal and breath sounds normal. No respiratory distress. She has no wheezes. She has no rales.  Breast: deferred   Abdominal: Soft. She exhibits no distension. There is no tenderness.  Lymphadenopathy: She has no cervical adenopathy.  Skin: Skin is warm and dry. She is not diaphoretic.  Psychiatric: She has a normal mood and affect. Her behavior is normal.     Lab Results  Component Value Date   WBC 6.3 08/01/2021   HGB 13.8 08/01/2021   HCT 39.7 08/01/2021   PLT 268.0 08/01/2021   GLUCOSE 107 (H) 01/30/2022   CHOL 230 (H) 01/30/2022   TRIG 289.0 (H) 01/30/2022   HDL 50.10 01/30/2022   LDLDIRECT 159.0 01/30/2022   LDLCALC 142 (H) 12/09/2018   ALT 20 01/30/2022   AST 20 01/30/2022   NA 136 01/30/2022   K 3.7 01/30/2022   CL 102 01/30/2022   CREATININE 0.99 01/30/2022   BUN 21 01/30/2022   CO2 26 01/30/2022   TSH 1.42 08/01/2021   HGBA1C 5.9 01/30/2022    The 10-year ASCVD risk score (Arnett DK, et al., 2019) is: 11.4%   Values used to calculate the score:     Age: 48 years     Sex: Female     Is Non-Hispanic African American: No     Diabetic: No     Tobacco smoker: No     Systolic Blood Pressure: 037 mmHg     Is BP treated: Yes     HDL Cholesterol: 50.1 mg/dL     Total Cholesterol: 230 mg/dL      Assessment & Plan:   Physical exam: Screening blood work  ordered Exercise  works with Physiological scientist.  3/week.  Tries to walk a few times a week Weight  overweight Substance abuse  none   Reviewed recommended immunizations.     Health Maintenance  Topic Date Due   Medicare Annual Wellness (AWV)  Never done   COVID-19 Vaccine (4 - Pfizer series) 08/23/2022 (Originally 09/07/2020)   Zoster Vaccines- Shingrix (1 of 2) 11/07/2022 (Originally 06/27/2004)   INFLUENZA VACCINE  01/06/2023 (Originally 05/08/2022)   Pneumonia Vaccine 63+ Years old (1 - PCV) 08/08/2023 (Originally 06/28/2019)   TETANUS/TDAP  08/08/2023 (Originally  06/27/1973)   DEXA SCAN  02/15/2023   MAMMOGRAM  07/16/2024   Fecal DNA (Cologuard)  08/16/2024   Hepatitis C Screening  Completed   HPV VACCINES  Aged Out   COLONOSCOPY (Pts 45-42yr Insurance coverage will need to be confirmed)  Discontinued          See Problem List for Assessment and Plan of chronic medical problems.

## 2022-08-06 NOTE — Patient Instructions (Addendum)
Blood work was ordered.   The lab is on the first floor.    Medications changes include :   none     Return in about 6 months (around 02/05/2023) for follow up.    Health Maintenance, Female Adopting a healthy lifestyle and getting preventive care are important in promoting health and wellness. Ask your health care provider about: The right schedule for you to have regular tests and exams. Things you can do on your own to prevent diseases and keep yourself healthy. What should I know about diet, weight, and exercise? Eat a healthy diet  Eat a diet that includes plenty of vegetables, fruits, low-fat dairy products, and lean protein. Do not eat a lot of foods that are high in solid fats, added sugars, or sodium. Maintain a healthy weight Body mass index (BMI) is used to identify weight problems. It estimates body fat based on height and weight. Your health care provider can help determine your BMI and help you achieve or maintain a healthy weight. Get regular exercise Get regular exercise. This is one of the most important things you can do for your health. Most adults should: Exercise for at least 150 minutes each week. The exercise should increase your heart rate and make you sweat (moderate-intensity exercise). Do strengthening exercises at least twice a week. This is in addition to the moderate-intensity exercise. Spend less time sitting. Even light physical activity can be beneficial. Watch cholesterol and blood lipids Have your blood tested for lipids and cholesterol at 68 years of age, then have this test every 5 years. Have your cholesterol levels checked more often if: Your lipid or cholesterol levels are high. You are older than 68 years of age. You are at high risk for heart disease. What should I know about cancer screening? Depending on your health history and family history, you may need to have cancer screening at various ages. This may include screening  for: Breast cancer. Cervical cancer. Colorectal cancer. Skin cancer. Lung cancer. What should I know about heart disease, diabetes, and high blood pressure? Blood pressure and heart disease High blood pressure causes heart disease and increases the risk of stroke. This is more likely to develop in people who have high blood pressure readings or are overweight. Have your blood pressure checked: Every 3-5 years if you are 18-9 years of age. Every year if you are 64 years old or older. Diabetes Have regular diabetes screenings. This checks your fasting blood sugar level. Have the screening done: Once every three years after age 24 if you are at a normal weight and have a low risk for diabetes. More often and at a younger age if you are overweight or have a high risk for diabetes. What should I know about preventing infection? Hepatitis B If you have a higher risk for hepatitis B, you should be screened for this virus. Talk with your health care provider to find out if you are at risk for hepatitis B infection. Hepatitis C Testing is recommended for: Everyone born from [birth year] through 1967. Anyone with known risk factors for hepatitis C. Sexually transmitted infections (STIs) Get screened for STIs, including gonorrhea and chlamydia, if: You are sexually active and are younger than 68 years of age. You are older than 68 years of age and your health care provider tells you that you are at risk for this type of infection. Your sexual activity has changed since you were last screened, and you are  at increased risk for chlamydia or gonorrhea. Ask your health care provider if you are at risk. Ask your health care provider about whether you are at high risk for HIV. Your health care provider may recommend a prescription medicine to help prevent HIV infection. If you choose to take medicine to prevent HIV, you should first get tested for HIV. You should then be tested every 3 months for as long as you  are taking the medicine. Pregnancy If you are about to stop having your period (premenopausal) and you may become pregnant, seek counseling before you get pregnant. Take 400 to 800 micrograms (mcg) of folic acid every day if you become pregnant. Ask for birth control (contraception) if you want to prevent pregnancy. Osteoporosis and menopause Osteoporosis is a disease in which the bones lose minerals and strength with aging. This can result in bone fractures. If you are 71 years old or older, or if you are at risk for osteoporosis and fractures, ask your health care provider if you should: Be screened for bone loss. Take a calcium or vitamin D supplement to lower your risk of fractures. Be given hormone replacement therapy (HRT) to treat symptoms of menopause. Follow these instructions at home: Alcohol use Do not drink alcohol if: Your health care provider tells you not to drink. You are pregnant, may be pregnant, or are planning to become pregnant. If you drink alcohol: Limit how much you have to: 0-1 drink a day. Know how much alcohol is in your drink. In the [place] one drink equals one 12 oz bottle of beer (355 mL), one 5 oz glass of wine (148 mL), or one 1 oz glass of hard liquor (44 mL). Lifestyle Do not use any products that contain nicotine or tobacco. These products include cigarettes, chewing tobacco, and vaping devices, such as e-cigarettes. If you need help quitting, ask your health care provider. Do not use street drugs. Do not share needles. Ask your health care provider for help if you need support or information about quitting drugs. General instructions Schedule regular health, dental, and eye exams. Stay current with your vaccines. Tell your health care provider if: You often feel depressed. You have ever been abused or do not feel safe at home. Summary Adopting a healthy lifestyle and getting preventive care are important in promoting health and wellness. Follow your  health care provider's instructions about healthy diet, exercising, and getting tested or screened for diseases. Follow your health care provider's instructions on monitoring your cholesterol and blood pressure. This information is not intended to replace advice given to you by your health care provider. Make sure you discuss any questions you have with your health care provider. Document Revised: 02/13/2021 Document Reviewed: 02/13/2021 Elsevier Patient Education  [employer].

## 2022-08-07 ENCOUNTER — Ambulatory Visit (INDEPENDENT_AMBULATORY_CARE_PROVIDER_SITE_OTHER): Payer: Medicare PPO | Admitting: Internal Medicine

## 2022-08-07 ENCOUNTER — Other Ambulatory Visit: Payer: Self-pay

## 2022-08-07 VITALS — BP 130/76 | HR 72 | Temp 98.2°F | Ht 62.0 in | Wt 155.0 lb

## 2022-08-07 DIAGNOSIS — E7849 Other hyperlipidemia: Secondary | ICD-10-CM | POA: Diagnosis not present

## 2022-08-07 DIAGNOSIS — R300 Dysuria: Secondary | ICD-10-CM | POA: Diagnosis not present

## 2022-08-07 DIAGNOSIS — M85851 Other specified disorders of bone density and structure, right thigh: Secondary | ICD-10-CM | POA: Diagnosis not present

## 2022-08-07 DIAGNOSIS — I10 Essential (primary) hypertension: Secondary | ICD-10-CM

## 2022-08-07 DIAGNOSIS — G479 Sleep disorder, unspecified: Secondary | ICD-10-CM

## 2022-08-07 DIAGNOSIS — R202 Paresthesia of skin: Secondary | ICD-10-CM

## 2022-08-07 DIAGNOSIS — R7303 Prediabetes: Secondary | ICD-10-CM

## 2022-08-07 DIAGNOSIS — Z0000 Encounter for general adult medical examination without abnormal findings: Secondary | ICD-10-CM

## 2022-08-07 DIAGNOSIS — M85852 Other specified disorders of bone density and structure, left thigh: Secondary | ICD-10-CM | POA: Diagnosis not present

## 2022-08-07 LAB — POC URINALSYSI DIPSTICK (AUTOMATED)
Bilirubin, UA: NEGATIVE
Blood, UA: NEGATIVE
Glucose, UA: NEGATIVE
Ketones, UA: NEGATIVE
Leukocytes, UA: NEGATIVE
Nitrite, UA: NEGATIVE
Protein, UA: NEGATIVE
Spec Grav, UA: 1.025 (ref 1.010–1.025)
Urobilinogen, UA: 0.2 E.U./dL
pH, UA: 6 (ref 5.0–8.0)

## 2022-08-07 NOTE — Assessment & Plan Note (Signed)
Chronic Intermittent tingling and hot feeling in feet Continue gabapentin 100-300 mg at bedtime

## 2022-08-07 NOTE — Assessment & Plan Note (Addendum)
Chronic Regular exercise and healthy diet encouraged Check lipid panel  Reviewed elevated ASCVD risk, coronary calcium score of 1 Continue lifestyle control

## 2022-08-07 NOTE — Assessment & Plan Note (Signed)
Chronic DEXA up-to-date Continue regular exercise Continue calcium and vitamin D daily

## 2022-08-07 NOTE — Addendum Note (Signed)
Addended by: Marcina Millard on: 08/07/2022 04:55 PM   Modules accepted: Orders

## 2022-08-07 NOTE — Assessment & Plan Note (Signed)
New Urine dip here negative for infection We will send for culture Advised increasing water intake

## 2022-08-07 NOTE — Assessment & Plan Note (Signed)
Chronic Check a1c Low sugar / carb diet Stressed regular exercise  

## 2022-08-07 NOTE — Assessment & Plan Note (Signed)
Chronic Blood pressure well controlled CMP Continue losartan-HCTZ 100-25 mg daily 

## 2022-08-07 NOTE — Assessment & Plan Note (Signed)
Chronic Controlled, Stable Continue clonazepam 0.5 mg at bedtime as needed Gabapentin 100-300 mg at bedtime-usually takes 200 mg at night

## 2022-08-08 ENCOUNTER — Ambulatory Visit
Admission: RE | Admit: 2022-08-08 | Discharge: 2022-08-08 | Disposition: A | Discharge status: Home / Self Care | Payer: Medicare PPO | Source: Ambulatory Visit | Attending: Internal Medicine | Admitting: Internal Medicine

## 2022-08-08 ENCOUNTER — Other Ambulatory Visit: Payer: Self-pay | Admitting: Internal Medicine

## 2022-08-08 DIAGNOSIS — N6310 Unspecified lump in the right breast, unspecified quadrant: Secondary | ICD-10-CM

## 2022-08-08 DIAGNOSIS — N6341 Unspecified lump in right breast, subareolar: Secondary | ICD-10-CM | POA: Diagnosis not present

## 2022-08-08 DIAGNOSIS — D241 Benign neoplasm of right breast: Secondary | ICD-10-CM | POA: Diagnosis not present

## 2022-08-09 LAB — CULTURE, URINE COMPREHENSIVE

## 2022-08-20 ENCOUNTER — Ambulatory Visit: Payer: Self-pay | Admitting: General Surgery

## 2022-08-20 DIAGNOSIS — D241 Benign neoplasm of right breast: Secondary | ICD-10-CM

## 2022-08-21 ENCOUNTER — Other Ambulatory Visit: Payer: Self-pay | Admitting: General Surgery

## 2022-08-21 DIAGNOSIS — D241 Benign neoplasm of right breast: Secondary | ICD-10-CM

## 2022-08-28 DIAGNOSIS — H11153 Pinguecula, bilateral: Secondary | ICD-10-CM | POA: Diagnosis not present

## 2022-08-28 DIAGNOSIS — H43393 Other vitreous opacities, bilateral: Secondary | ICD-10-CM | POA: Diagnosis not present

## 2022-08-28 DIAGNOSIS — H2513 Age-related nuclear cataract, bilateral: Secondary | ICD-10-CM | POA: Diagnosis not present

## 2022-08-28 DIAGNOSIS — H524 Presbyopia: Secondary | ICD-10-CM | POA: Diagnosis not present

## 2022-09-17 ENCOUNTER — Other Ambulatory Visit: Payer: Self-pay | Admitting: Internal Medicine

## 2022-09-17 NOTE — Patient Instructions (Incomplete)
Asthma Increase Alvesco 80 to 2 puffs twice a day for the next 2 weeks, then return to 2 puffs once a day with a spacer to prevent cough or wheeze.  Continue montelukast 10 mg once a day to prevent cough or wheeze Continue albuterol 2 puffs once every 4 hours as needed You may use albuterol 2 puffs 5-15 minutes before activity to decrease cough or wheeze  Allergic rhinitis Continue mometasone nasal spray 50 mcg 1-2 sprays each nostril once a day as needed for stuffy nose Contiue Xyzal (levocetirizine) 5 mg once a day as needed for runny nose/itching Continue ipratroprium nasal spray 2 sprays in each nostril twice a day as needed for a runny nose Consider saline nasal rinses as needed for nasal symptoms. Use this before any medicated nasal sprays for best result  Reflux Continue dietary and lifestyle modifications  Continue esomeprazole once a day as needed to control reflux. Take up to 1 hour before meals for best result  Facial pain/TMJ Follow up with your dentist for evaluation of left sided facial pain  Call the clinic if this treatment plan is not working well for you  Follow up in 3 months or sooner if needed.

## 2022-09-17 NOTE — Progress Notes (Unsigned)
   [place] [id number] Dept: [phone]  FOLLOW UP NOTE  Patient ID: Samantha Shepard [facility], female    DOB: 1955-12-12  Age: 66 y.o. MRN: [id number] Date of Office Visit: 09/18/2022  Assessment  Chief Complaint: No chief complaint on file.  HPI Samantha Shepard is a 66 year old female who presents to the clinic for a follow up visit. She was last seen in this clinic on 10/18/2021 by Gareth Morgan, FNP, for evaluation of asthma, allergic rhinitis, reflux, and TMJ.    Drug Allergies:  Allergies  Allergen Reactions   Sulfonamide Derivatives     REACTION: rash   Codeine     REACTION: Excitability--can't sleep    Physical Exam: There were no vitals taken for this visit.   Physical Exam  Diagnostics:    Assessment and Plan: No diagnosis found.  No orders of the defined types were placed in this encounter.   There are no Patient Instructions on file for this visit.  No follow-ups on file.    Thank you for the opportunity to care for this patient.  Please do not hesitate to contact me with questions.  Gareth Morgan, FNP [facility]    '

## 2022-09-18 ENCOUNTER — Other Ambulatory Visit: Payer: Self-pay

## 2022-09-18 ENCOUNTER — Ambulatory Visit: Payer: Medicare PPO | Admitting: Family Medicine

## 2022-09-18 ENCOUNTER — Encounter: Payer: Self-pay | Admitting: Family Medicine

## 2022-09-18 VITALS — BP 112/70 | HR 85 | Temp 98.6°F | Resp 18 | Wt 156.8 lb

## 2022-09-18 DIAGNOSIS — J4540 Moderate persistent asthma, uncomplicated: Secondary | ICD-10-CM | POA: Insufficient documentation

## 2022-09-18 DIAGNOSIS — J3089 Other allergic rhinitis: Secondary | ICD-10-CM

## 2022-09-18 DIAGNOSIS — K219 Gastro-esophageal reflux disease without esophagitis: Secondary | ICD-10-CM

## 2022-09-18 MED ORDER — FLUTICASONE-SALMETEROL 115-21 MCG/ACT IN AERO: 2.0000 | INHALATION_SPRAY | Freq: Two times a day (BID) | RESPIRATORY_TRACT | 12 refills | Status: DC

## 2022-09-18 MED ORDER — MONTELUKAST SODIUM 10 MG PO TABS: 10.0000 mg | ORAL_TABLET | Freq: Every day | ORAL | 1 refills | Status: DC

## 2022-10-11 ENCOUNTER — Other Ambulatory Visit: Payer: Self-pay | Admitting: Internal Medicine

## 2022-10-12 ENCOUNTER — Encounter (HOSPITAL_BASED_OUTPATIENT_CLINIC_OR_DEPARTMENT_OTHER): Payer: Self-pay | Admitting: General Surgery

## 2022-10-12 ENCOUNTER — Other Ambulatory Visit: Payer: Self-pay

## 2022-10-12 NOTE — Progress Notes (Signed)
   10/12/22 1302  Pre-op Phone Call  Surgery Date Verified 10/18/22  Arrival Time Verified 0715  Surgery Location Verified [facility]   Medical History Reviewed Yes  Is the patient taking a GLP-1 receptor agonist? No  Does the patient have diabetes? No diagnosis of diabetes  Do you have a history of heart problems? No  Antiarrhythmic device type  (NA)  Patient educated on enhanced recovery. Yes  Patient educated about smoking cessation 24 hours prior to surgery. N/A Non-Smoker  Med Rec Completed Yes  Take the Following Meds the Morning of Surgery Prilosec AM of sx with sip water and inhalers PRN  Recent  Lab Work, EKG, CXR? No  NPO (Including gum & candy) After midnight  Patient instructed to stop clear liquids including Carb loading drink at: 0515  Stop Solids, Milk, Candy, and Gum STARTING AT MIDNIGHT  Responsible adult to drive and be with you for 24 hours? Yes  Name & Phone Number for Ride/Caregiver husband, Legrand Como  No Jewelry, money, nail polish or make-up.  No lotions, powders, perfumes. No shaving  48 hrs. prior to surgery. Yes  Contacts, Dentures & Glasses Will Have to be Removed Before OR. Yes  Please bring your ID and Insurance Card the morning of your surgery. (Surgery Centers Only) Yes  Bring any papers or x-rays with you that your surgeon gave you. Yes  Instructed to contact the location of procedure/ provider if they or anyone in their household develops symptoms or tests positive for COVID-19, has close contact with someone who tests positive for COVID, or has known exposure to any contagious illness. Yes  Call this number the morning of surgery  with any problems that may cancel your surgery. [phone]  Covid-19 Assessment  Have you had a positive COVID-19 test within the previous 90 days? No  COVID Testing Guidance Proceed with the additional questions.

## 2022-10-17 ENCOUNTER — Encounter (HOSPITAL_BASED_OUTPATIENT_CLINIC_OR_DEPARTMENT_OTHER)
Admission: RE | Admit: 2022-10-17 | Discharge: 2022-10-17 | Disposition: A | Discharge status: Home / Self Care | Payer: Medicare PPO | Source: Ambulatory Visit | Attending: General Surgery | Admitting: General Surgery

## 2022-10-17 ENCOUNTER — Ambulatory Visit
Admission: RE | Admit: 2022-10-17 | Discharge: 2022-10-17 | Disposition: A | Discharge status: Home / Self Care | Payer: Medicare PPO | Source: Ambulatory Visit | Attending: General Surgery | Admitting: General Surgery

## 2022-10-17 DIAGNOSIS — D241 Benign neoplasm of right breast: Secondary | ICD-10-CM | POA: Diagnosis not present

## 2022-10-17 DIAGNOSIS — I10 Essential (primary) hypertension: Secondary | ICD-10-CM | POA: Diagnosis not present

## 2022-10-17 DIAGNOSIS — J45909 Unspecified asthma, uncomplicated: Secondary | ICD-10-CM | POA: Diagnosis not present

## 2022-10-17 DIAGNOSIS — Z79899 Other long term (current) drug therapy: Secondary | ICD-10-CM | POA: Diagnosis not present

## 2022-10-17 DIAGNOSIS — R928 Other abnormal and inconclusive findings on diagnostic imaging of breast: Secondary | ICD-10-CM | POA: Diagnosis not present

## 2022-10-17 DIAGNOSIS — K219 Gastro-esophageal reflux disease without esophagitis: Secondary | ICD-10-CM | POA: Diagnosis not present

## 2022-10-17 HISTORY — PX: BREAST BIOPSY: SHX20

## 2022-10-17 NOTE — Anesthesia Preprocedure Evaluation (Addendum)
Anesthesia Evaluation  Patient identified by MRN, date of birth, ID band Patient awake    Reviewed: Allergy & Precautions, NPO status , Patient's Chart, lab work & pertinent test results  Airway Mallampati: III  TM Distance: >3 FB Neck ROM: Full    Dental  (+) Teeth Intact, Dental Advisory Given   Pulmonary asthma (well controlled)    Pulmonary exam normal breath sounds clear to auscultation       Cardiovascular hypertension (130/79 preop), Pt. on medications Normal cardiovascular exam Rhythm:Regular Rate:Normal     Neuro/Psych  Headaches PSYCHIATRIC DISORDERS Anxiety        GI/Hepatic Neg liver ROS,GERD  Controlled and Medicated,,  Endo/Other  negative endocrine ROS    Renal/GU negative Renal ROS  negative genitourinary   Musculoskeletal negative musculoskeletal ROS (+)    Abdominal   Peds  Hematology negative hematology ROS (+)   Anesthesia Other Findings   Reproductive/Obstetrics negative OB ROS                             Anesthesia Physical Anesthesia Plan  ASA: 2  Anesthesia Plan: General   Post-op Pain Management: Tylenol PO (pre-op)*   Induction: Intravenous  PONV Risk Score and Plan: 3 and Ondansetron, Dexamethasone, Midazolam and Treatment may vary due to age or medical condition  Airway Management Planned: LMA  Additional Equipment: None  Intra-op Plan:   Post-operative Plan: Extubation in OR  Informed Consent: I have reviewed the patients History and Physical, chart, labs and discussed the procedure including the risks, benefits and alternatives for the proposed anesthesia with the patient or authorized representative who has indicated his/her understanding and acceptance.     Dental advisory given  Plan Discussed with: CRNA  Anesthesia Plan Comments:        Anesthesia Quick Evaluation

## 2022-10-17 NOTE — Progress Notes (Addendum)
Texted patient to remind them to come for pre procedure testing today.   Patient picked up soap and drink at 10am With understanding of instructions.       Patient Instructions  The night before surgery:  No food after midnight. ONLY clear liquids after midnight  The day of surgery (if you do NOT have diabetes):  Drink ONE (1) Pre-Surgery Clear Ensure as directed.   This drink was given to you during your hospital  pre-op appointment visit. The pre-op nurse will instruct you on the time to drink the  Pre-Surgery Ensure depending on your surgery time. Finish the drink at the designated time by the pre-op nurse.  Nothing else to drink after completing the  Pre-Surgery Clear Ensure.  The day of surgery (if you have diabetes): Drink ONE (1) Gatorade 2 (G2) as directed. This drink was given to you during your hospital  pre-op appointment visit.  The pre-op nurse will instruct you on the time to drink the   Gatorade 2 (G2) depending on your surgery time. Color of the Gatorade may vary. Red is not allowed. Nothing else to drink after completing the  Gatorade 2 (G2).         If you have questions, please contact your surgeon's office.

## 2022-10-18 ENCOUNTER — Encounter (HOSPITAL_BASED_OUTPATIENT_CLINIC_OR_DEPARTMENT_OTHER): Payer: Self-pay | Admitting: General Surgery

## 2022-10-18 ENCOUNTER — Ambulatory Visit (HOSPITAL_BASED_OUTPATIENT_CLINIC_OR_DEPARTMENT_OTHER): Payer: Medicare PPO | Admitting: Anesthesiology

## 2022-10-18 ENCOUNTER — Other Ambulatory Visit: Payer: Self-pay

## 2022-10-18 ENCOUNTER — Ambulatory Visit
Admission: RE | Admit: 2022-10-18 | Discharge: 2022-10-18 | Disposition: A | Discharge status: Home / Self Care | Payer: Medicare PPO | Source: Ambulatory Visit | Attending: General Surgery | Admitting: General Surgery

## 2022-10-18 ENCOUNTER — Ambulatory Visit (HOSPITAL_BASED_OUTPATIENT_CLINIC_OR_DEPARTMENT_OTHER)
Admission: RE | Admit: 2022-10-18 | Discharge: 2022-10-18 | Disposition: A | Discharge status: Home / Self Care | Payer: Medicare PPO | Attending: General Surgery | Admitting: General Surgery

## 2022-10-18 ENCOUNTER — Encounter (HOSPITAL_BASED_OUTPATIENT_CLINIC_OR_DEPARTMENT_OTHER)
Admission: RE | Disposition: A | Discharge status: Home / Self Care | Payer: Self-pay | Source: Home / Self Care | Attending: General Surgery

## 2022-10-18 DIAGNOSIS — J45909 Unspecified asthma, uncomplicated: Secondary | ICD-10-CM | POA: Insufficient documentation

## 2022-10-18 DIAGNOSIS — Z79899 Other long term (current) drug therapy: Secondary | ICD-10-CM | POA: Diagnosis not present

## 2022-10-18 DIAGNOSIS — D241 Benign neoplasm of right breast: Secondary | ICD-10-CM

## 2022-10-18 DIAGNOSIS — K219 Gastro-esophageal reflux disease without esophagitis: Secondary | ICD-10-CM | POA: Diagnosis not present

## 2022-10-18 DIAGNOSIS — I10 Essential (primary) hypertension: Secondary | ICD-10-CM | POA: Insufficient documentation

## 2022-10-18 DIAGNOSIS — R928 Other abnormal and inconclusive findings on diagnostic imaging of breast: Secondary | ICD-10-CM | POA: Diagnosis not present

## 2022-10-18 HISTORY — PX: BREAST LUMPECTOMY WITH RADIOACTIVE SEED LOCALIZATION: SHX6424

## 2022-10-18 SURGERY — BREAST LUMPECTOMY WITH RADIOACTIVE SEED LOCALIZATION
Anesthesia: General | Site: Breast | Laterality: Right

## 2022-10-18 MED ORDER — CHLORHEXIDINE GLUCONATE CLOTH 2 % EX PADS: 6.0000 | MEDICATED_PAD | Freq: Once | CUTANEOUS | Status: DC

## 2022-10-18 MED ORDER — OXYCODONE HCL 5 MG PO TABS: 5.0000 mg | ORAL_TABLET | Freq: Once | ORAL | Status: DC | PRN

## 2022-10-18 MED ORDER — GABAPENTIN 300 MG PO CAPS
ORAL_CAPSULE | ORAL | Status: AC
  Filled 2022-10-18: qty 1

## 2022-10-18 MED ORDER — ONDANSETRON HCL 4 MG/2ML IJ SOLN: 4.0000 mg | Freq: Once | INTRAMUSCULAR | Status: DC | PRN

## 2022-10-18 MED ORDER — BUPIVACAINE-EPINEPHRINE (PF) 0.25% -1:200000 IJ SOLN
INTRAMUSCULAR | Status: AC
  Filled 2022-10-18: qty 120

## 2022-10-18 MED ORDER — ONDANSETRON HCL 4 MG/2ML IJ SOLN
INTRAMUSCULAR | Status: DC | PRN
  Administered 2022-10-18: 4 mg via INTRAVENOUS

## 2022-10-18 MED ORDER — PROPOFOL 10 MG/ML IV BOLUS
INTRAVENOUS | Status: AC
  Filled 2022-10-18: qty 20

## 2022-10-18 MED ORDER — BUPIVACAINE-EPINEPHRINE (PF) 0.25% -1:200000 IJ SOLN
INTRAMUSCULAR | Status: DC | PRN
  Administered 2022-10-18: 15 mL

## 2022-10-18 MED ORDER — LIDOCAINE HCL (CARDIAC) PF 100 MG/5ML IV SOSY
PREFILLED_SYRINGE | INTRAVENOUS | Status: DC | PRN
  Administered 2022-10-18: 60 mg via INTRAVENOUS

## 2022-10-18 MED ORDER — FENTANYL CITRATE (PF) 100 MCG/2ML IJ SOLN
INTRAMUSCULAR | Status: AC
  Filled 2022-10-18: qty 2

## 2022-10-18 MED ORDER — OXYCODONE HCL 5 MG/5ML PO SOLN: 5.0000 mg | Freq: Once | ORAL | Status: DC | PRN

## 2022-10-18 MED ORDER — ACETAMINOPHEN 500 MG PO TABS
1000.0000 mg | ORAL_TABLET | ORAL | Status: AC
  Administered 2022-10-18: 1000 mg via ORAL

## 2022-10-18 MED ORDER — CEFAZOLIN SODIUM-DEXTROSE 2-3 GM-%(50ML) IV SOLR
INTRAVENOUS | Status: DC | PRN
  Administered 2022-10-18: 2 g via INTRAVENOUS

## 2022-10-18 MED ORDER — HYDROMORPHONE HCL 1 MG/ML IJ SOLN: 0.2500 mg | INTRAMUSCULAR | Status: DC | PRN

## 2022-10-18 MED ORDER — DEXAMETHASONE SODIUM PHOSPHATE 10 MG/ML IJ SOLN
INTRAMUSCULAR | Status: AC
  Filled 2022-10-18: qty 1

## 2022-10-18 MED ORDER — GABAPENTIN 300 MG PO CAPS
300.0000 mg | ORAL_CAPSULE | ORAL | Status: AC
  Administered 2022-10-18: 300 mg via ORAL

## 2022-10-18 MED ORDER — LACTATED RINGERS IV SOLN: INTRAVENOUS | Status: DC

## 2022-10-18 MED ORDER — TRAMADOL HCL 50 MG PO TABS: 50.0000 mg | ORAL_TABLET | Freq: Four times a day (QID) | ORAL | 0 refills | Status: DC | PRN

## 2022-10-18 MED ORDER — LACTATED RINGERS IV SOLN: INTRAVENOUS | Status: DC | PRN

## 2022-10-18 MED ORDER — CEFAZOLIN SODIUM-DEXTROSE 2-4 GM/100ML-% IV SOLN: 2.0000 g | INTRAVENOUS | Status: DC

## 2022-10-18 MED ORDER — ACETAMINOPHEN 500 MG PO TABS
ORAL_TABLET | ORAL | Status: AC
  Filled 2022-10-18: qty 2

## 2022-10-18 MED ORDER — FENTANYL CITRATE (PF) 100 MCG/2ML IJ SOLN
INTRAMUSCULAR | Status: DC | PRN
  Administered 2022-10-18: 50 ug via INTRAVENOUS

## 2022-10-18 MED ORDER — MIDAZOLAM HCL 2 MG/2ML IJ SOLN
INTRAMUSCULAR | Status: DC | PRN
  Administered 2022-10-18: 2 mg via INTRAVENOUS

## 2022-10-18 MED ORDER — DEXAMETHASONE SODIUM PHOSPHATE 10 MG/ML IJ SOLN
INTRAMUSCULAR | Status: DC | PRN
  Administered 2022-10-18: 5 mg via INTRAVENOUS

## 2022-10-18 MED ORDER — LIDOCAINE 2% (20 MG/ML) 5 ML SYRINGE
INTRAMUSCULAR | Status: AC
  Filled 2022-10-18: qty 5

## 2022-10-18 MED ORDER — PROPOFOL 10 MG/ML IV BOLUS
INTRAVENOUS | Status: DC | PRN
  Administered 2022-10-18: 150 mg via INTRAVENOUS

## 2022-10-18 MED ORDER — ONDANSETRON HCL 4 MG/2ML IJ SOLN
INTRAMUSCULAR | Status: AC
  Filled 2022-10-18: qty 2

## 2022-10-18 MED ORDER — AMISULPRIDE (ANTIEMETIC) 5 MG/2ML IV SOLN: 10.0000 mg | Freq: Once | INTRAVENOUS | Status: DC | PRN

## 2022-10-18 MED ORDER — ACETAMINOPHEN 500 MG PO TABS: 1000.0000 mg | ORAL_TABLET | Freq: Once | ORAL | Status: DC

## 2022-10-18 MED ORDER — MIDAZOLAM HCL 2 MG/2ML IJ SOLN
INTRAMUSCULAR | Status: AC
  Filled 2022-10-18: qty 2

## 2022-10-18 MED ORDER — CEFAZOLIN SODIUM-DEXTROSE 2-4 GM/100ML-% IV SOLN
INTRAVENOUS | Status: AC
  Filled 2022-10-18: qty 100

## 2022-10-18 SURGICAL SUPPLY — 44 items
ADH SKN CLS APL DERMABOND .7 (GAUZE/BANDAGES/DRESSINGS) ×1
APL PRP STRL LF DISP 70% ISPRP (MISCELLANEOUS) ×1
APPLIER CLIP 9.375 MED OPEN (MISCELLANEOUS)
APR CLP MED 9.3 20 MLT OPN (MISCELLANEOUS)
BLADE SURG 15 STRL LF DISP TIS (BLADE) ×1 IMPLANT
BLADE SURG 15 STRL SS (BLADE) ×1
CANISTER SUC SOCK COL 7IN (MISCELLANEOUS) ×1 IMPLANT
CANISTER SUCT 1200ML W/VALVE (MISCELLANEOUS) ×1 IMPLANT
CHLORAPREP W/TINT 26 (MISCELLANEOUS) ×1 IMPLANT
CLIP APPLIE 9.375 MED OPEN (MISCELLANEOUS) IMPLANT
COVER BACK TABLE 60X90IN (DRAPES) ×1 IMPLANT
COVER MAYO STAND STRL (DRAPES) ×1 IMPLANT
COVER PROBE CYLINDRICAL 5X96 (MISCELLANEOUS) ×1 IMPLANT
DERMABOND ADVANCED .7 DNX12 (GAUZE/BANDAGES/DRESSINGS) ×1 IMPLANT
DRAPE LAPAROSCOPIC ABDOMINAL (DRAPES) ×1 IMPLANT
DRAPE UTILITY XL STRL (DRAPES) ×1 IMPLANT
ELECT COATED BLADE 2.86 ST (ELECTRODE) ×1 IMPLANT
ELECT REM PT RETURN 9FT ADLT (ELECTROSURGICAL) ×1
ELECTRODE REM PT RTRN 9FT ADLT (ELECTROSURGICAL) ×1 IMPLANT
GLOVE BIO SURGEON STRL SZ7.5 (GLOVE) ×2 IMPLANT
GLOVE BIOGEL PI IND STRL 6.5 (GLOVE) IMPLANT
GLOVE BIOGEL PI IND STRL 7.0 (GLOVE) IMPLANT
GLOVE ECLIPSE 6.5 STRL STRAW (GLOVE) IMPLANT
GOWN STRL REUS W/ TWL LRG LVL3 (GOWN DISPOSABLE) ×2 IMPLANT
GOWN STRL REUS W/TWL LRG LVL3 (GOWN DISPOSABLE) ×2
ILLUMINATOR WAVEGUIDE N/F (MISCELLANEOUS) IMPLANT
KIT MARKER MARGIN INK (KITS) ×1 IMPLANT
LIGHT WAVEGUIDE WIDE FLAT (MISCELLANEOUS) IMPLANT
NDL HYPO 25X1 1.5 SAFETY (NEEDLE) IMPLANT
NEEDLE HYPO 25X1 1.5 SAFETY (NEEDLE) ×1 IMPLANT
NS IRRIG 1000ML POUR BTL (IV SOLUTION) IMPLANT
PACK BASIN DAY SURGERY FS (CUSTOM PROCEDURE TRAY) ×1 IMPLANT
PENCIL SMOKE EVACUATOR (MISCELLANEOUS) ×1 IMPLANT
SLEEVE SCD COMPRESS KNEE MED (STOCKING) ×1 IMPLANT
SPIKE FLUID TRANSFER (MISCELLANEOUS) IMPLANT
SPONGE T-LAP 18X18 ~~LOC~~+RFID (SPONGE) ×1 IMPLANT
SUT MON AB 4-0 PC3 18 (SUTURE) ×1 IMPLANT
SUT SILK 2 0 SH (SUTURE) IMPLANT
SUT VICRYL 3-0 CR8 SH (SUTURE) ×1 IMPLANT
SYR CONTROL 10ML LL (SYRINGE) IMPLANT
TOWEL GREEN STERILE FF (TOWEL DISPOSABLE) ×1 IMPLANT
TRAY FAXITRON CT DISP (TRAY / TRAY PROCEDURE) ×1 IMPLANT
TUBE CONNECTING 20X1/4 (TUBING) ×1 IMPLANT
YANKAUER SUCT BULB TIP NO VENT (SUCTIONS) IMPLANT

## 2022-10-18 NOTE — H&P (Signed)
REFERRING PHYSICIAN: Binnie Rail, MD  PROVIDER: Landry Corporal, MD  MRN: [id number] DOB: 01/31/1956 Subjective   Chief Complaint: Breast Problem   History of Present Illness: Samantha Shepard is a 67 y.o. female who is seen today as an office consultation for evaluation of Breast Problem .   We are asked to see the patient in consultation by Dr. Billey Gosling to evaluate her for a papilloma in the right breast. The patient is a 67 year old white female who recently went for a routine screening mammogram. At that time she was found to have a 5 mm mass behind the nipple of the right breast. This was biopsied and came back as an intraductal papilloma. She denies any breast pain or discharge from the nipple. She is otherwise in good health and does not smoke. She has had an intraductal papilloma removed from the left breast several years ago  Review of Systems: A complete review of systems was obtained from the patient. I have reviewed this information and discussed as appropriate with the patient. See HPI as well for other ROS.  Review of Systems  Constitutional: Negative.  HENT: Negative.  Eyes: Negative.  Respiratory: Negative.  Cardiovascular: Negative.  Gastrointestinal: Negative.  Genitourinary: Negative.  Musculoskeletal: Negative.  Skin: Negative.  Neurological: Negative.  Endo/Heme/Allergies: Negative.  Psychiatric/Behavioral: Negative.    Medical History: History reviewed. No pertinent past medical history.  There is no problem list on file for this patient.  History reviewed. No pertinent surgical history.   Allergies  Allergen Reactions  Codeine Other (See Comments)  REACTION: Excitability--can't sleep  Sulfa (Sulfonamide Antibiotics) Other (See Comments)   Current Outpatient Medications on File Prior to Visit  Medication Sig Dispense Refill  estradiol-norethindrone (ACTIVELLA) 1-0.5 mg tablet [place] 1 mg-0.5 mg tablet one daily or every other day =  as needed  losartan (COZAAR) 25 MG tablet 50 mg  montelukast (SINGULAIR) 10 mg tablet Take 1 tablet by mouth once daily   No current facility-administered medications on file prior to visit.   No family history on file.   Social History   Tobacco Use  Smoking Status Not on file  Smokeless Tobacco Not on file    Social History   Socioeconomic History  Marital status: Married   Objective:   Vitals:  BP: (!) 142/76  Pulse: 68  Weight: 71 kg (156 lb 9.6 oz)  Height: 157.5 cm ('5\' 2"'$ )   Body mass index is 28.64 kg/m.  Physical Exam Vitals reviewed.  Constitutional:  General: She is not in acute distress. Appearance: Normal appearance.  HENT:  Head: Normocephalic and atraumatic.  Right Ear: External ear normal.  Left Ear: External ear normal.  Nose: Nose normal.  Mouth/Throat:  Mouth: Mucous membranes are moist.  Pharynx: Oropharynx is clear.  Eyes:  General: No scleral icterus. Extraocular Movements: Extraocular movements intact.  Conjunctiva/sclera: Conjunctivae normal.  Pupils: Pupils are equal, round, and reactive to light.  Cardiovascular:  Rate and Rhythm: Normal rate and regular rhythm.  Pulses: Normal pulses.  Heart sounds: Normal heart sounds.  Pulmonary:  Effort: Pulmonary effort is normal. No respiratory distress.  Breath sounds: Normal breath sounds.  Abdominal:  General: Bowel sounds are normal.  Palpations: Abdomen is soft.  Tenderness: There is no abdominal tenderness.  Musculoskeletal:  General: No swelling, tenderness or deformity. Normal range of motion.  Cervical back: Normal range of motion and neck supple.  Skin: General: Skin is warm and dry.  Coloration: Skin is not jaundiced.  Neurological:  General: No focal deficit present.  Mental Status: She is alert and oriented to person, place, and time.  Psychiatric:  Mood and Affect: Mood normal.  Behavior: Behavior normal.     Breast: There is a palpable bruise behind the right  nipple. Other than this there is no palpable mass in either breast. There is no palpable axillary, supraclavicular, or cervical lymphadenopathy.  Labs, Imaging and Diagnostic Testing:  Assessment and Plan:   Diagnoses and all orders for this visit:  Intraductal papilloma of right breast    The patient appears to have a 5 mm intraductal papilloma behind the nipple of the right breast. Because of its abnormal appearance and because there is a 5 to 10% chance of missing something more significant the recommendation is to have this area removed. She would also like to have this done. I have discussed with her in detail the risks and benefits of the operation to remove this as well as some of the technical aspects including the use of a radioactive seed for localization and she understands and wishes to proceed.

## 2022-10-18 NOTE — Anesthesia Postprocedure Evaluation (Signed)
Anesthesia Post Note  Patient: Bethani Brugger Snowball  Procedure(s) Performed: RIGHT BREAST LUMPECTOMY WITH RADIOACTIVE SEED LOCALIZATION (Right: Breast)     Patient location during evaluation: PACU Anesthesia Type: General Level of consciousness: awake and alert, oriented and patient cooperative Pain management: pain level controlled Vital Signs Assessment: post-procedure vital signs reviewed and stable Respiratory status: spontaneous breathing, nonlabored ventilation and respiratory function stable Cardiovascular status: blood pressure returned to baseline and stable Postop Assessment: no apparent nausea or vomiting Anesthetic complications: no   No notable events documented.  Last Vitals:  Vitals:   10/18/22 0930 10/18/22 0943  BP: 129/79 (!) 155/91  Pulse: 86 84  Resp: 15 18  Temp:  36.5 C  SpO2: 98% 98%    Last Pain:  Vitals:   10/18/22 0943  TempSrc:   PainSc: 0-No pain                 Pervis Hocking

## 2022-10-18 NOTE — Op Note (Signed)
10/18/2022  8:54 AM  PATIENT:  Samantha Shepard  67 y.o. female  PRE-OPERATIVE DIAGNOSIS:  RIGHT BREAST PAPILLOMA  POST-OPERATIVE DIAGNOSIS:  RIGHT BREAST PAPILLOMA  PROCEDURE:  Procedure(s): RIGHT BREAST LUMPECTOMY WITH RADIOACTIVE SEED LOCALIZATION (Right)  SURGEON:  Surgeon(s) and Role:    * Jovita Kussmaul, MD - Primary  PHYSICIAN ASSISTANT:   ASSISTANTS: none   ANESTHESIA:   local and general  EBL:  minimal   BLOOD ADMINISTERED:none  DRAINS: none   LOCAL MEDICATIONS USED:  MARCAINE     SPECIMEN:  Source of Specimen:  right breast tissue  DISPOSITION OF SPECIMEN:  PATHOLOGY  COUNTS:  YES  TOURNIQUET:  * No tourniquets in log *  DICTATION: .Dragon Dictation  After informed consent was obtained the patient was brought to the operating room and placed in the supine position on the operating table.  After adequate induction of general anesthesia the patient's right breast was prepped with ChloraPrep, allowed to dry, and draped in usual sterile manner.  An appropriate timeout was performed.  Previously an I-125 seed was placed in the subareolar right breast to mark an area of intraductal papilloma.  The neoprobe was set to I-125 in the area of radioactivity was readily identified.  The area around this was infiltrated with quarter percent Marcaine.  A curvilinear incision was then made along the upper outer edge of the areola of the right breast with a 15 blade knife.  The incision was carried through the skin and subcutaneous tissue sharply with the electrocautery.  Dissection was then carried superficially behind the areola and nipple.  The dissection was carried out around the radioactive seed while checking the area of radioactivity frequently.  Once the dissection was completed the specimen was removed from the patient.  The specimen was then oriented with the appropriate paint colors.  A specimen radiograph was obtained that showed the clip and seed to be within  the specimen.  The specimen was then sent to pathology for further evaluation.  Hemostasis was achieved using the Bovie electrocautery.  The wound was irrigated with saline and infiltrated with more quarter percent Marcaine.  The deep layer of the incision was closed with interrupted 3-0 Vicryl stitches.  The skin was then closed with interrupted 4-0 Monocryl subcuticular stitches.  Dermabond dressings were applied.  The patient tolerated the procedure well.  At the end of the case all needle sponge and instrument counts were correct.  The patient was then awakened and taken to recovery in stable condition.  PLAN OF CARE: Discharge to home after PACU  PATIENT DISPOSITION:  PACU - hemodynamically stable.   Delay start of Pharmacological VTE agent (>24hrs) due to surgical blood loss or risk of bleeding: not applicable

## 2022-10-18 NOTE — Discharge Instructions (Signed)
  Post Anesthesia Home Care Instructions  Activity: Get plenty of rest for the remainder of the day. A responsible individual must stay with you for 24 hours following the procedure.  For the next 24 hours, DO NOT: -Drive a car -Paediatric nurse -Drink alcoholic beverages -Take any medication unless instructed by your physician -Make any legal decisions or sign important papers.  Meals: Start with liquid foods such as gelatin or soup. Progress to regular foods as tolerated. Avoid greasy, spicy, heavy foods. If nausea and/or vomiting occur, drink only clear liquids until the nausea and/or vomiting subsides. Call your physician if vomiting continues.  Special Instructions/Symptoms: Your throat may feel dry or sore from the anesthesia or the breathing tube placed in your throat during surgery. If this causes discomfort, gargle with warm salt water. The discomfort should disappear within 24 hours.  If you had a scopolamine patch placed behind your ear for the management of post- operative nausea and/or vomiting:  1. The medication in the patch is effective for 72 hours, after which it should be removed.  Wrap patch in a tissue and discard in the trash. Wash hands thoroughly with soap and water. 2. You may remove the patch earlier than 72 hours if you experience unpleasant side effects which may include dry mouth, dizziness or visual disturbances. 3. Avoid touching the patch. Wash your hands with soap and water after contact with the patch.    No Tylenol until after 1:30pm today.

## 2022-10-18 NOTE — Interval H&P Note (Signed)
History and Physical Interval Note:  10/18/2022 7:50 AM  Roslyn  has presented today for surgery, with the diagnosis of RIGHT BREAST PAPILLOMA.  The various methods of treatment have been discussed with the patient and family. After consideration of risks, benefits and other options for treatment, the patient has consented to  Procedure(s): RIGHT BREAST LUMPECTOMY WITH RADIOACTIVE SEED LOCALIZATION (Right) as a surgical intervention.  The patient's history has been reviewed, patient examined, no change in status, stable for surgery.  I have reviewed the patient's chart and labs.  Questions were answered to the patient's satisfaction.     [name]

## 2022-10-18 NOTE — Transfer of Care (Signed)
Immediate Anesthesia Transfer of Care Note  Patient: Samantha Shepard  Procedure(s) Performed: RIGHT BREAST LUMPECTOMY WITH RADIOACTIVE SEED LOCALIZATION (Right: Breast)  Patient Location: PACU  Anesthesia Type:General  Level of Consciousness: awake, alert , and oriented  Airway & Oxygen Therapy: Patient Spontanous Breathing and Patient connected to face mask oxygen  Post-op Assessment: Report given to RN and Post -op Vital signs reviewed and stable  Post vital signs: Reviewed and stable  Last Vitals:  Vitals Value Taken Time  BP    Temp    Pulse 75 10/18/22 0859  Resp    SpO2 99 % 10/18/22 0859  Vitals shown include unvalidated device data.  Last Pain:  Vitals:   10/18/22 0732  TempSrc: Oral  PainSc: 0-No pain      Patients Stated Pain Goal: 3 (89/02/28 4069)  Complications: No notable events documented.

## 2022-10-19 ENCOUNTER — Encounter (HOSPITAL_BASED_OUTPATIENT_CLINIC_OR_DEPARTMENT_OTHER): Payer: Self-pay | Admitting: General Surgery

## 2022-10-19 LAB — SURGICAL PATHOLOGY

## 2022-11-27 ENCOUNTER — Encounter (HOSPITAL_COMMUNITY): Payer: Self-pay

## 2022-12-18 ENCOUNTER — Other Ambulatory Visit: Payer: Self-pay

## 2022-12-18 ENCOUNTER — Encounter: Payer: Self-pay | Admitting: Family Medicine

## 2022-12-18 ENCOUNTER — Ambulatory Visit: Payer: Medicare PPO | Admitting: Family Medicine

## 2022-12-18 VITALS — BP 126/78 | HR 87 | Temp 98.0°F | Resp 18 | Ht 62.0 in | Wt 155.0 lb

## 2022-12-18 DIAGNOSIS — J3089 Other allergic rhinitis: Secondary | ICD-10-CM

## 2022-12-18 DIAGNOSIS — J4540 Moderate persistent asthma, uncomplicated: Secondary | ICD-10-CM | POA: Diagnosis not present

## 2022-12-18 DIAGNOSIS — K219 Gastro-esophageal reflux disease without esophagitis: Secondary | ICD-10-CM

## 2022-12-18 MED ORDER — IPRATROPIUM BROMIDE 0.06 % NA SOLN: 2.0000 | Freq: Three times a day (TID) | NASAL | 5 refills | Status: AC

## 2022-12-18 MED ORDER — FLUTICASONE PROPIONATE 50 MCG/ACT NA SUSP: 2.0000 | Freq: Every day | NASAL | 3 refills | Status: DC | PRN

## 2022-12-18 MED ORDER — FLUTICASONE FUROATE-VILANTEROL 200-25 MCG/ACT IN AEPB: 1.0000 | INHALATION_SPRAY | Freq: Every day | RESPIRATORY_TRACT | 5 refills | Status: DC

## 2022-12-18 MED ORDER — ALBUTEROL SULFATE HFA 108 (90 BASE) MCG/ACT IN AERS: 2.0000 | INHALATION_SPRAY | RESPIRATORY_TRACT | 1 refills | Status: DC | PRN

## 2022-12-18 NOTE — Patient Instructions (Addendum)
Asthma Begin Breo 200-1 puff once a day a day with a spacer to prevent cough or wheeze  Continue albuterol 2 puffs once every 4 hours as needed You may use albuterol 2 puffs 5-15 minutes before activity to decrease cough or wheeze  Allergic rhinitis Begin Flonase or Nasacort 2 sprays in each nostril once a day as needed for a stuffy nose Contiue Xyzal (levocetirizine) 2.5-5 mg once a day as needed for runny nose/itching Continue ipratroprium nasal spray 2 sprays in each nostril twice a day as needed for a runny nose Consider saline nasal rinses as needed for nasal symptoms. Use this before any medicated nasal sprays for best result  Reflux Continue dietary and lifestyle modifications  Continue esomeprazole once a day as needed to control reflux. Take up to 30-60 minutes before meals for best result  Call the clinic if this treatment plan is not working well for you  Follow up in 3 months or sooner if needed.

## 2022-12-18 NOTE — Progress Notes (Signed)
Samantha Shepard [id number] Dept: [phone]  FOLLOW UP NOTE  Patient ID: Samantha Shepard [facility], female    DOB: 1956/09/19  Age: 67 y.o. MRN: [id number] Date of Office Visit: 12/18/2022  Assessment  Chief Complaint: No chief complaint on file.  HPI Samantha Shepard is a 67 year old female who presents the clinic for follow-up visit.  She was last seen in this clinic on 09/18/2022 by Gareth Morgan, FNP, for evaluation of asthma, allergic rhinitis, reflux, and TMJ.  At today's visit, she reports her asthma has been moderately well-controlled with symptoms including occasional shortness of breath with activity, intermittent wheezing, and cough which produces mucus in the morning and is dry in the afternoon and evening.  She reports that she tried Advair 115 HFA with no resolution of symptoms.  She reports that she stopped taking Advair several months ago.  She stopped taking montelukast as well due to perceived side effects of depression and sleep disturbances.  She reports the symptoms have resolved at this time.  She does report that she has used her husband's Breo with relief of asthma symptoms.  She rarely uses albuterol for relief of asthma symptoms.  Allergic rhinitis is reported as moderately well-controlled with symptoms including clear rhinorrhea, sneezing, and postnasal drainage.  She rarely takes Xyzal as she reports this makes her appetite increase the following day.  She is not currently using any nasal sprays including Flonase, ipratropium, or nasal saline.  Reflux is reported as moderately well-controlled with heartburn occurring recently several days of the week.  She continues esomeprazole only as needed and takes this medication at nighttime.  Her current medications are listed in the chart.  Drug Allergies:  Allergies  Allergen Reactions   Sulfonamide Derivatives     REACTION: rash   Codeine     REACTION: Excitability--can't sleep    Physical  Exam: BP 126/78 (BP Location: Left Arm, Patient Position: Sitting, Cuff Size: Normal)   Pulse 87   Temp 98 F (36.7 C) (Temporal)   Resp 18   Ht '5\' 2"'$  (1.575 m)   Wt 155 lb (70.3 kg)   SpO2 97%   BMI 28.35 kg/m    Physical Exam Vitals reviewed.  Constitutional:      Appearance: Normal appearance.  HENT:     Head: Normocephalic and atraumatic.     Right Ear: Tympanic membrane normal.     Left Ear: Tympanic membrane normal.     Nose:     Comments: Bilateral nares slightly erythematous with clear nasal drainage noted.  Pharynx normal.  Ears normal.  Eyes normal.    Mouth/Throat:     Pharynx: Oropharynx is clear.  Eyes:     Conjunctiva/sclera: Conjunctivae normal.  Cardiovascular:     Rate and Rhythm: Normal rate and regular rhythm.     Heart sounds: Normal heart sounds. No murmur heard. Pulmonary:     Effort: Pulmonary effort is normal.     Breath sounds: Normal breath sounds.     Comments: Lungs clear to auscultation Musculoskeletal:        General: Normal range of motion.     Cervical back: Normal range of motion and neck supple.  Skin:    General: Skin is warm and dry.  Neurological:     Mental Status: She is alert and oriented to person, place, and time.  Psychiatric:        Mood and Affect: Mood normal.        Behavior:  Behavior normal.        Thought Content: Thought content normal.        Judgment: Judgment normal.     Diagnostics: FVC 1.39, FEV1 1.34.  Predicted FVC 2.70, predicted FEV1 2.11.  Spirometry indicates possible restriction.  This is consistent with previous spirometry readings.  Assessment and Plan: 1. Not well controlled moderate persistent asthma   2. Gastroesophageal reflux disease, unspecified whether esophagitis present   3. Other allergic rhinitis     No orders of the defined types were placed in this encounter.   Patient Instructions  Asthma Begin Breo 200-1 puff once a day a day with a spacer to prevent cough or wheeze  Continue  albuterol 2 puffs once every 4 hours as needed You may use albuterol 2 puffs 5-15 minutes before activity to decrease cough or wheeze  Allergic rhinitis Begin Flonase or Nasacort 2 sprays in each nostril once a day as needed for a stuffy nose Contiue Xyzal (levocetirizine) 2.5-5 mg once a day as needed for runny nose/itching Continue ipratroprium nasal spray 2 sprays in each nostril twice a day as needed for a runny nose Consider saline nasal rinses as needed for nasal symptoms. Use this before any medicated nasal sprays for best result  Reflux Continue dietary and lifestyle modifications  Continue esomeprazole once a day as needed to control reflux. Take up to 30-60 minutes before meals for best result  Call the clinic if this treatment plan is not working well for you  Follow up in 3 months or sooner if needed.   Return in about 3 months (around 03/20/2023), or if symptoms worsen or fail to improve.    Thank you for the opportunity to care for this patient.  Please do not hesitate to contact me with questions.  Gareth Morgan, FNP [facility]

## 2022-12-18 NOTE — Addendum Note (Signed)
Addended by: Dara Hoyer on: 12/18/2022 01:35 PM   Modules accepted: Orders

## 2023-01-01 ENCOUNTER — Telehealth: Payer: Self-pay | Admitting: Internal Medicine

## 2023-01-01 NOTE — Telephone Encounter (Signed)
Called patient to schedule Medicare Annual Wellness Visit (AWV). Left message for patient to call back and schedule Medicare Annual Wellness Visit (AWV).  Last date of AWV: awvi 06/08/20 per [facility]   Please schedule an appointment at any time with [facility] .  If any questions, please contact me at [phone].  Thank you ,  Barkley Boards AWV direct phone # [phone]

## 2023-02-04 ENCOUNTER — Encounter: Payer: Self-pay | Admitting: Internal Medicine

## 2023-02-04 NOTE — Patient Instructions (Addendum)
      Blood work was ordered.   The lab is on the first floor.    Medications changes include :   amlodipine 2.5 mg daily for blood pressure    A referral was ordered for Dr Blackman orthopedics at [facility].     Someone will call you to schedule an appointment.    Return in about 6 months (around 08/07/2023) for Physical Exam.  

## 2023-02-04 NOTE — Progress Notes (Unsigned)
Subjective:    Patient ID: Samantha Shepard, female    DOB: [birth year], 67 y.o.   MRN: [id number]     HPI Aryahna is here for follow up of her chronic medical problems.  Pain on lateral aspect of heel - b/l feet - L > R   Walking for exercise.    Medications and allergies reviewed with patient and updated if appropriate.  Current Outpatient Medications on File Prior to Visit  Medication Sig Dispense Refill   albuterol (VENTOLIN HFA) 108 (90 Base) MCG/ACT inhaler Inhale 2 puffs into the lungs every 4 (four) hours as needed for wheezing or shortness of breath. 18 g 1   cholecalciferol (VITAMIN D3) 25 MCG (1000 UT) tablet Take 1,000 Units by mouth daily.     clonazePAM (KLONOPIN) 0.5 MG tablet TAKE 1 TABLET BY MOUTH AT BEDTIME AS NEEDED FOR ANXIETY OR TMJ 30 tablet 2   cyclobenzaprine (FLEXERIL) 5 MG tablet TAKE 1-2 TABLETS (5-10 MG TOTAL) BY MOUTH AT BEDTIME AS NEEDED FOR MUSCLE SPASMS. 25 tablet 1   estradiol-norethindrone (ACTIVELLA) 1-0.5 MG tablet Take 1 mg by mouth every other day. Trying to wean. Takes 1/2 tab every other day     fluticasone (FLONASE) 50 MCG/ACT nasal spray Place 2 sprays into both nostrils daily as needed for allergies or rhinitis. 16 g 3   fluticasone furoate-vilanterol (BREO ELLIPTA) 200-25 MCG/ACT AEPB Inhale 1 puff into the lungs daily. 1 each 5   gabapentin (NEURONTIN) 100 MG capsule TAKE 1-3 CAPSULES BY MOUTH AT BEDTIME 90 capsule 3   ipratropium (ATROVENT) 0.06 % nasal spray Place 2 sprays into both nostrils 3 (three) times daily. 15 mL 5   levocetirizine (XYZAL) 5 MG tablet Take 1 tablet once a day as needed for runny nose/itching. 30 tablet 5   losartan-hydrochlorothiazide (HYZAAR) 100-25 MG tablet TAKE 1 TABLET BY MOUTH DAILY 90 tablet 3   magnesium 30 MG tablet Take 30 mg by mouth 2 (two) times daily.     Multiple Vitamin (MULTIVITAMIN WITH MINERALS) TABS tablet Take 1 tablet by mouth daily.     omeprazole (PRILOSEC) 20 MG capsule  Take 20 mg by mouth daily.     traMADol (ULTRAM) 50 MG tablet Take 1 tablet (50 mg total) by mouth every 6 (six) hours as needed. 15 tablet 0   montelukast (SINGULAIR) 10 MG tablet Take 1 tablet (10 mg total) by mouth daily. (Patient not taking: Reported on 02/05/2023) 90 tablet 1   No current facility-administered medications on file prior to visit.     Review of Systems  Constitutional:  Negative for fever.  Respiratory:  Positive for cough (chronic) and shortness of breath. Negative for wheezing.   Cardiovascular:  Negative for chest pain, palpitations and leg swelling.  Neurological:  Positive for headaches. Negative for light-headedness.       Objective:   Vitals:   02/05/23 1002  BP: 130/80  Pulse: 70  Temp: 98.3 F (36.8 C)  SpO2: 98%   BP Readings from Last 3 Encounters:  02/05/23 130/80  12/18/22 126/78  10/18/22 (!) 155/91   Wt Readings from Last 3 Encounters:  12/18/22 155 lb (70.3 kg)  10/18/22 154 lb 1.6 oz (69.9 kg)  09/18/22 156 lb 12.8 oz (71.1 kg)   Body mass index is 28.35 kg/m.    Physical Exam Constitutional:      General: She is not in acute distress.    Appearance: Normal appearance.  HENT:  Head: Normocephalic and atraumatic.  Eyes:     Conjunctiva/sclera: Conjunctivae normal.  Cardiovascular:     Rate and Rhythm: Normal rate and regular rhythm.     Heart sounds: Normal heart sounds.  Pulmonary:     Effort: Pulmonary effort is normal. No respiratory distress.     Breath sounds: Normal breath sounds. No wheezing.  Musculoskeletal:     Cervical back: Neck supple.     Right lower leg: No edema.     Left lower leg: No edema.     Comments: Pain on the lateral aspect of heel bilaterally  Lymphadenopathy:     Cervical: No cervical adenopathy.  Skin:    General: Skin is warm and dry.     Findings: No rash.  Neurological:     Mental Status: She is alert. Mental status is at baseline.  Psychiatric:        Mood and Affect: Mood normal.         Behavior: Behavior normal.        Lab Results  Component Value Date   WBC 6.3 08/01/2021   HGB 13.8 08/01/2021   HCT 39.7 08/01/2021   PLT 268.0 08/01/2021   GLUCOSE 107 (H) 01/30/2022   CHOL 230 (H) 01/30/2022   TRIG 289.0 (H) 01/30/2022   HDL 50.10 01/30/2022   LDLDIRECT 159.0 01/30/2022   LDLCALC 142 (H) 12/09/2018   ALT 20 01/30/2022   AST 20 01/30/2022   NA 136 01/30/2022   K 3.7 01/30/2022   CL 102 01/30/2022   CREATININE 0.99 01/30/2022   BUN 21 01/30/2022   CO2 26 01/30/2022   TSH 1.42 08/01/2021   HGBA1C 5.9 01/30/2022     Assessment & Plan:    See Problem List for Assessment and Plan of chronic medical problems.

## 2023-02-05 ENCOUNTER — Ambulatory Visit: Payer: Medicare PPO | Admitting: Internal Medicine

## 2023-02-05 ENCOUNTER — Other Ambulatory Visit (INDEPENDENT_AMBULATORY_CARE_PROVIDER_SITE_OTHER): Payer: Medicare PPO

## 2023-02-05 VITALS — BP 130/80 | HR 70 | Temp 98.3°F | Ht 62.0 in

## 2023-02-05 DIAGNOSIS — E7849 Other hyperlipidemia: Secondary | ICD-10-CM

## 2023-02-05 DIAGNOSIS — I10 Essential (primary) hypertension: Secondary | ICD-10-CM | POA: Diagnosis not present

## 2023-02-05 DIAGNOSIS — G479 Sleep disorder, unspecified: Secondary | ICD-10-CM | POA: Diagnosis not present

## 2023-02-05 DIAGNOSIS — M85851 Other specified disorders of bone density and structure, right thigh: Secondary | ICD-10-CM | POA: Diagnosis not present

## 2023-02-05 DIAGNOSIS — M79671 Pain in right foot: Secondary | ICD-10-CM

## 2023-02-05 DIAGNOSIS — R7303 Prediabetes: Secondary | ICD-10-CM

## 2023-02-05 DIAGNOSIS — R053 Chronic cough: Secondary | ICD-10-CM | POA: Diagnosis not present

## 2023-02-05 DIAGNOSIS — M79672 Pain in left foot: Secondary | ICD-10-CM | POA: Diagnosis not present

## 2023-02-05 DIAGNOSIS — M85852 Other specified disorders of bone density and structure, left thigh: Secondary | ICD-10-CM | POA: Diagnosis not present

## 2023-02-05 LAB — CBC WITH DIFFERENTIAL/PLATELET
Basophils Absolute: 0 10*3/uL (ref 0.0–0.1)
Basophils Relative: 0.6 % (ref 0.0–3.0)
Eosinophils Absolute: 0.2 10*3/uL (ref 0.0–0.7)
Eosinophils Relative: 2.7 % (ref 0.0–5.0)
HCT: 40.2 % (ref 36.0–46.0)
Hemoglobin: 13.9 g/dL (ref 12.0–15.0)
Lymphocytes Relative: 24.4 % (ref 12.0–46.0)
Lymphs Abs: 1.4 10*3/uL (ref 0.7–4.0)
MCHC: 34.6 g/dL (ref 30.0–36.0)
MCV: 98.1 fl (ref 78.0–100.0)
Monocytes Absolute: 0.6 10*3/uL (ref 0.1–1.0)
Monocytes Relative: 10.4 % (ref 3.0–12.0)
Neutro Abs: 3.6 10*3/uL (ref 1.4–7.7)
Neutrophils Relative %: 61.9 % (ref 43.0–77.0)
Platelets: 253 10*3/uL (ref 150.0–400.0)
RBC: 4.1 Mil/uL (ref 3.87–5.11)
RDW: 12.9 % (ref 11.5–15.5)
WBC: 5.9 10*3/uL (ref 4.0–10.5)

## 2023-02-05 LAB — LIPID PANEL
Cholesterol: 224 mg/dL — ABNORMAL HIGH (ref 0–200)
HDL: 42.4 mg/dL (ref >39.00)
NonHDL: 181.33
Total CHOL/HDL Ratio: 5
Triglycerides: 201 mg/dL — ABNORMAL HIGH (ref 0.0–149.0)
VLDL: 40.2 mg/dL — ABNORMAL HIGH (ref 0.0–40.0)

## 2023-02-05 LAB — COMPREHENSIVE METABOLIC PANEL
ALT: 24 U/L (ref 0–35)
AST: 18 U/L (ref 0–37)
Albumin: 4.2 g/dL (ref 3.5–5.2)
Alkaline Phosphatase: 54 U/L (ref 39–117)
BUN: 21 mg/dL (ref 6–23)
CO2: 25 mEq/L (ref 19–32)
Calcium: 9.9 mg/dL (ref 8.4–10.5)
Chloride: 103 mEq/L (ref 96–112)
Creatinine, Ser: 0.88 mg/dL (ref 0.40–1.20)
GFR: 67.44 mL/min (ref >60.00)
Glucose, Bld: 95 mg/dL (ref 70–99)
Potassium: 4.1 mEq/L (ref 3.5–5.1)
Sodium: 137 mEq/L (ref 135–145)
Total Bilirubin: 0.5 mg/dL (ref 0.2–1.2)
Total Protein: 7.2 g/dL (ref 6.0–8.3)

## 2023-02-05 LAB — HEMOGLOBIN A1C: Hgb A1c MFr Bld: 6.1 % (ref 4.6–6.5)

## 2023-02-05 LAB — TSH: TSH: 1.33 u[IU]/mL (ref 0.35–5.50)

## 2023-02-05 LAB — LDL CHOLESTEROL, DIRECT: Direct LDL: 160 mg/dL

## 2023-02-05 NOTE — Assessment & Plan Note (Signed)
Chronic Regular exercise and healthy diet encouraged Check lipid panel  coronary calcium score of 1 Continue lifestyle control

## 2023-02-05 NOTE — Assessment & Plan Note (Signed)
Chronic Following with allergy Likely combination of allergy and asthma

## 2023-02-05 NOTE — Assessment & Plan Note (Signed)
Chronic Blood pressure well controlled CMP Continue losartan-HCTZ 100-25 mg daily 

## 2023-02-05 NOTE — Assessment & Plan Note (Addendum)
Chronic Controlled, Stable Continue clonazepam 0.5 mg at bedtime as needed Gabapentin 100-300 mg at bedtime-usually takes 200 mg at night as needed-does not take often

## 2023-02-05 NOTE — Assessment & Plan Note (Signed)
Chronic Check a1c Low sugar / carb diet Stressed regular exercise  

## 2023-02-06 ENCOUNTER — Other Ambulatory Visit: Payer: Medicare PPO

## 2023-02-19 DIAGNOSIS — M722 Plantar fascial fibromatosis: Secondary | ICD-10-CM | POA: Diagnosis not present

## 2023-02-19 DIAGNOSIS — M79672 Pain in left foot: Secondary | ICD-10-CM | POA: Diagnosis not present

## 2023-02-19 DIAGNOSIS — M79671 Pain in right foot: Secondary | ICD-10-CM | POA: Diagnosis not present

## 2023-02-19 DIAGNOSIS — M216X1 Other acquired deformities of right foot: Secondary | ICD-10-CM | POA: Diagnosis not present

## 2023-02-19 DIAGNOSIS — M216X2 Other acquired deformities of left foot: Secondary | ICD-10-CM | POA: Diagnosis not present

## 2023-03-15 ENCOUNTER — Other Ambulatory Visit: Payer: Self-pay | Admitting: Family Medicine

## 2023-04-01 NOTE — Patient Instructions (Incomplete)
Asthma/cough Continue albuterol 2 puffs once every 4 hours as needed You may use albuterol 2 puffs 5-15 minutes before activity to decrease cough or wheeze For asthma flare, begin Breo 200-1 puff once a day for 1-2 weeks or until cough and wheeze free  Allergic rhinitis Begin Flonase or Nasacort 2 sprays in each nostril once a day as needed for a stuffy nose Contiue Xyzal (levocetirizine) 2.5-5 mg once a day as needed for runny nose/itching Continue ipratroprium nasal spray 2 sprays in each nostril twice a day as needed for a runny nose Consider saline nasal rinses as needed for nasal symptoms. Use this before any medicated nasal sprays for best result Consider updating your environmental allergy testing.  Remember to stop your antihistamines for 3 days before your testing appointment  Reflux Continue dietary and lifestyle modifications  Begin famotidine 20 mg once or twice a day to control reflux. This will replace esomeprazole and omeprazole  Call the clinic if this treatment plan is not working well for you  Follow up in 6 months or sooner if needed.

## 2023-04-01 NOTE — Progress Notes (Signed)
[address] [place]  [address] Dept: [phone]  FOLLOW UP NOTE  Patient ID: Samantha Shepard [facility], female    DOB: 07-17-1956  Age: 67 y.o. MRN: [id number] Date of Office Visit: 04/02/2023  Assessment  Chief Complaint: Cough and Follow-up (Not using breo as directed, doing better at night)  HPI Marka Notch is a 67 year old female who presents to the clinic for follow-up visit.  She was last seen in this clinic on 12/18/2022 by Thermon Leyland, FNP, for evaluation of not well-controlled asthma, allergic rhinitis, and reflux.,  And post nasal drainage.  She continues ipratropium as needed and is not currently using an antihistamine.  She is not currently using nasal saline rinses.  She reports  At today's visit, she reports her asthma has been moderately well-controlled with cough producing thin clear mucus as the main symptom.  She denies shortness of breath or wheeze with activity or rest.  She reports the cough is consistent without getting better or worse regardless of treatment throughout her lifetime.  She continues Breo 200 about 2 times a week and has not used her albuterol since her last visit to this clinic.  She does report that heat aggravates her cough.    Allergic rhinitis is reported as moderately well-controlled with occasional rhinorrhea, nasal congestion, sneezing, and postnasal drainage occurring intermittently.  She continues to use ipratropium as needed and is not currently using an antihistamine, a steroid nasal spray, or saline nasal spray.  She reports her symptoms of low rhinorrhea are worse when in the mountains.  She is not experiencing current symptoms of allergic conjunctivitis and is not using any medical intervention at this time.  Reflux is reported as moderately well-controlled with infrequent episodes of heartburn.  She denies vomiting.  She continues esomeprazole once or twice a week with relief of symptoms.  Her current medications are  listed in the chart.  Drug Allergies:  Allergies  Allergen Reactions   Sulfonamide Derivatives     REACTION: rash   Codeine     REACTION: Excitability--can't sleep    Physical Exam: BP 128/70 (BP Location: Left Arm, Patient Position: Sitting, Cuff Size: Normal)   Pulse 72   Temp 98.2 F (36.8 C) (Temporal)   Resp 18   Ht 5\' 2"  (1.575 m)   SpO2 99%   BMI 28.35 kg/m    Physical Exam Vitals reviewed.  Constitutional:      Appearance: Normal appearance.  HENT:     Head: Normocephalic and atraumatic.     Right Ear: Tympanic membrane normal.     Left Ear: Tympanic membrane normal.     Nose:     Comments: Bilateral nares normal.  Pharynx slightly erythematous with no exudate.  Ears normal.  Eyes normal.    Mouth/Throat:     Pharynx: Oropharynx is clear.  Eyes:     Conjunctiva/sclera: Conjunctivae normal.  Cardiovascular:     Rate and Rhythm: Normal rate and regular rhythm.     Heart sounds: Normal heart sounds. No murmur heard. Pulmonary:     Effort: Pulmonary effort is normal.     Breath sounds: Normal breath sounds.     Comments: Lungs clear to auscultation Musculoskeletal:        General: Normal range of motion.     Cervical back: Normal range of motion and neck supple.  Skin:    General: Skin is warm and dry.  Neurological:     Mental Status: She is alert and oriented  to person, place, and time.  Psychiatric:        Mood and Affect: Mood normal.        Behavior: Behavior normal.        Thought Content: Thought content normal.        Judgment: Judgment normal.     Diagnostics: FVC 1.55, FEV1 1.41. Predicted FVC 2.69, predicted FEV1 2.10. Spirometry indicates possible restriction  Assessment and Plan: 1. Mild persistent asthma without complication   2. Other allergic rhinitis   3. Chronic cough   4. Gastroesophageal reflux disease, unspecified whether esophagitis present     Meds ordered this encounter  Medications   famotidine (PEPCID) 20 MG tablet     Sig: Take 1 tablet (20 mg total) by mouth 2 (two) times daily.    Dispense:  60 tablet    Refill:  3    Patient Instructions  Asthma/cough Continue albuterol 2 puffs once every 4 hours as needed You may use albuterol 2 puffs 5-15 minutes before activity to decrease cough or wheeze For asthma flare, begin Breo 200-1 puff once a day for 1-2 weeks or until cough and wheeze free  Allergic rhinitis Begin Flonase or Nasacort 2 sprays in each nostril once a day as needed for a stuffy nose Contiue Xyzal (levocetirizine) 2.5-5 mg once a day as needed for runny nose/itching Continue ipratroprium nasal spray 2 sprays in each nostril twice a day as needed for a runny nose Consider saline nasal rinses as needed for nasal symptoms. Use this before any medicated nasal sprays for best result Consider updating your environmental allergy testing.  Remember to stop your antihistamines for 3 days before your testing appointment  Reflux Continue dietary and lifestyle modifications  Begin famotidine 20 mg once or twice a day to control reflux. This will replace esomeprazole and omeprazole  Call the clinic if this treatment plan is not working well for you  Follow up in 6 months or sooner if needed.  Return in about 6 months (around 10/02/2023), or if symptoms worsen or fail to improve.    Thank you for the opportunity to care for this patient.  Please do not hesitate to contact me with questions.  Thermon Leyland, FNP [facility]

## 2023-04-02 ENCOUNTER — Ambulatory Visit: Payer: Medicare PPO | Admitting: Family Medicine

## 2023-04-02 ENCOUNTER — Other Ambulatory Visit: Payer: Self-pay

## 2023-04-02 ENCOUNTER — Encounter: Payer: Self-pay | Admitting: Family Medicine

## 2023-04-02 VITALS — BP 128/70 | HR 72 | Temp 98.2°F | Resp 18 | Ht 62.0 in

## 2023-04-02 DIAGNOSIS — K219 Gastro-esophageal reflux disease without esophagitis: Secondary | ICD-10-CM | POA: Diagnosis not present

## 2023-04-02 DIAGNOSIS — J4530 Mild persistent asthma, uncomplicated: Secondary | ICD-10-CM

## 2023-04-02 DIAGNOSIS — J3089 Other allergic rhinitis: Secondary | ICD-10-CM | POA: Diagnosis not present

## 2023-04-02 DIAGNOSIS — R053 Chronic cough: Secondary | ICD-10-CM | POA: Diagnosis not present

## 2023-04-02 MED ORDER — FAMOTIDINE 20 MG PO TABS: 20.0000 mg | ORAL_TABLET | Freq: Two times a day (BID) | ORAL | 3 refills | Status: DC

## 2023-05-10 DIAGNOSIS — H11153 Pinguecula, bilateral: Secondary | ICD-10-CM | POA: Diagnosis not present

## 2023-05-10 DIAGNOSIS — H2513 Age-related nuclear cataract, bilateral: Secondary | ICD-10-CM | POA: Diagnosis not present

## 2023-05-10 DIAGNOSIS — H04123 Dry eye syndrome of bilateral lacrimal glands: Secondary | ICD-10-CM | POA: Diagnosis not present

## 2023-05-10 DIAGNOSIS — H31092 Other chorioretinal scars, left eye: Secondary | ICD-10-CM | POA: Diagnosis not present

## 2023-05-10 DIAGNOSIS — H43393 Other vitreous opacities, bilateral: Secondary | ICD-10-CM | POA: Diagnosis not present

## 2023-06-28 ENCOUNTER — Other Ambulatory Visit: Payer: Self-pay | Admitting: Family Medicine

## 2023-07-05 ENCOUNTER — Other Ambulatory Visit: Payer: Self-pay | Admitting: Family Medicine

## 2023-07-05 NOTE — Telephone Encounter (Signed)
Called patient and left a voicemail asking for a return call to discuss.

## 2023-07-05 NOTE — Telephone Encounter (Signed)
Can you please present the choices to [facility]. These are both twice a day dosing. Symbicort is an HFA inhaler and Wixela is a dry powder similar to [employer]. Thank you

## 2023-07-05 NOTE — Telephone Encounter (Signed)
Breo not covered symbicort is advise to change

## 2023-07-05 NOTE — Telephone Encounter (Signed)
Pharmacy states that Samantha Shepard is not covered, and that preferred alternatives are Symbicort or Wixela. Do you want to change to either of these inhalers?

## 2023-07-08 ENCOUNTER — Other Ambulatory Visit (HOSPITAL_COMMUNITY): Payer: Self-pay

## 2023-07-08 NOTE — Telephone Encounter (Signed)
Test claim shows Earlie Server is covered through [facility] with a $40.00 co-pay

## 2023-07-09 NOTE — Telephone Encounter (Signed)
I called and spoke with the patient, she stated that she is fine with trying the Symbicort, and if she doesn't like it she will call us back and let us know. Symbicort has been sent in to her pharmacy. Patient verbalized understanding.

## 2023-07-09 NOTE — Telephone Encounter (Signed)
This chain is spiraling. Can you please ask the patient which one she wants. They are all about the same so I'm ok with which ever one she chooses that is covered by her insurance. Thank you

## 2023-07-11 NOTE — Telephone Encounter (Signed)
Thank you :)

## 2023-08-05 ENCOUNTER — Encounter: Payer: Self-pay | Admitting: Internal Medicine

## 2023-08-05 NOTE — Patient Instructions (Addendum)
      Blood work was ordered.   The lab is on the first floor.    Medications changes include :   None     Return in about 6 months (around 02/04/2024) for Physical Exam, Schedule DEXA-[facility] .

## 2023-08-05 NOTE — Progress Notes (Unsigned)
Subjective:    Patient ID: Samantha Shepard, female    DOB: Dec 15, 1955, 67 y.o.   MRN: [id number]     HPI Samantha Shepard is here for follow up of her chronic medical problems.  CKD 2 - discuss    Medications and allergies reviewed with patient and updated if appropriate.  Current Outpatient Medications on File Prior to Visit  Medication Sig Dispense Refill   albuterol (VENTOLIN HFA) 108 (90 Base) MCG/ACT inhaler Inhale 2 puffs into the lungs every 4 (four) hours as needed for wheezing or shortness of breath. 18 g 1   cholecalciferol (VITAMIN D3) 25 MCG (1000 UT) tablet Take 1,000 Units by mouth daily.     clonazePAM (KLONOPIN) 0.5 MG tablet TAKE 1 TABLET BY MOUTH AT BEDTIME AS NEEDED FOR ANXIETY OR TMJ 30 tablet 2   cyclobenzaprine (FLEXERIL) 5 MG tablet TAKE 1-2 TABLETS (5-10 MG TOTAL) BY MOUTH AT BEDTIME AS NEEDED FOR MUSCLE SPASMS. 25 tablet 1   estradiol-norethindrone (ACTIVELLA) 1-0.5 MG tablet Take 1 mg by mouth every other day. Trying to wean. Takes 1/2 tab every other day     famotidine (PEPCID) 20 MG tablet TAKE 1 TABLET BY MOUTH TWICE A DAY 180 tablet 1   fluticasone (FLONASE) 50 MCG/ACT nasal spray PLACE 2 SPRAYS INTO BOTH NOSTRILS DAILY AS NEEDED FOR ALLERGIES OR RHINITIS. 48 mL 1   gabapentin (NEURONTIN) 100 MG capsule TAKE 1-3 CAPSULES BY MOUTH AT BEDTIME 90 capsule 3   ipratropium (ATROVENT) 0.06 % nasal spray Place 2 sprays into both nostrils 3 (three) times daily. 15 mL 5   levocetirizine (XYZAL) 5 MG tablet Take 1 tablet once a day as needed for runny nose/itching. 30 tablet 5   losartan-hydrochlorothiazide (HYZAAR) 100-25 MG tablet TAKE 1 TABLET BY MOUTH DAILY 90 tablet 3   magnesium 30 MG tablet Take 30 mg by mouth 2 (two) times daily.     Multiple Vitamin (MULTIVITAMIN WITH MINERALS) TABS tablet Take 1 tablet by mouth daily.     omeprazole (PRILOSEC) 20 MG capsule Take 20 mg by mouth daily.     SYMBICORT 80-4.5 MCG/ACT inhaler Inhale 2 puffs into the  lungs in the morning and at bedtime. 10.2 g 5   No current facility-administered medications on file prior to visit.     Review of Systems     Objective:  There were no vitals filed for this visit. BP Readings from Last 3 Encounters:  04/02/23 128/70  02/05/23 130/80  12/18/22 126/78   Wt Readings from Last 3 Encounters:  12/18/22 155 lb (70.3 kg)  10/18/22 154 lb 1.6 oz (69.9 kg)  09/18/22 156 lb 12.8 oz (71.1 kg)   There is no height or weight on file to calculate BMI.    Physical Exam     Lab Results  Component Value Date   WBC 5.9 02/05/2023   HGB 13.9 02/05/2023   HCT 40.2 02/05/2023   PLT 253.0 02/05/2023   GLUCOSE 95 02/05/2023   CHOL 224 (H) 02/05/2023   TRIG 201.0 (H) 02/05/2023   HDL 42.40 02/05/2023   LDLDIRECT 160.0 02/05/2023   LDLCALC 142 (H) 12/09/2018   ALT 24 02/05/2023   AST 18 02/05/2023   NA 137 02/05/2023   K 4.1 02/05/2023   CL 103 02/05/2023   CREATININE 0.88 02/05/2023   BUN 21 02/05/2023   CO2 25 02/05/2023   TSH 1.33 02/05/2023   HGBA1C 6.1 02/05/2023     Assessment & Plan:  See Problem List for Assessment and Plan of chronic medical problems.

## 2023-08-06 ENCOUNTER — Ambulatory Visit: Payer: Medicare PPO | Admitting: Internal Medicine

## 2023-08-06 VITALS — BP 126/70 | HR 72 | Temp 98.1°F | Ht 62.0 in

## 2023-08-06 DIAGNOSIS — I10 Essential (primary) hypertension: Secondary | ICD-10-CM

## 2023-08-06 DIAGNOSIS — M85852 Other specified disorders of bone density and structure, left thigh: Secondary | ICD-10-CM | POA: Diagnosis not present

## 2023-08-06 DIAGNOSIS — E7849 Other hyperlipidemia: Secondary | ICD-10-CM | POA: Diagnosis not present

## 2023-08-06 DIAGNOSIS — G479 Sleep disorder, unspecified: Secondary | ICD-10-CM | POA: Diagnosis not present

## 2023-08-06 DIAGNOSIS — R7303 Prediabetes: Secondary | ICD-10-CM

## 2023-08-06 DIAGNOSIS — M85851 Other specified disorders of bone density and structure, right thigh: Secondary | ICD-10-CM | POA: Diagnosis not present

## 2023-08-06 LAB — CBC WITH DIFFERENTIAL/PLATELET
Basophils Absolute: 0.1 10*3/uL (ref 0.0–0.1)
Basophils Relative: 0.7 % (ref 0.0–3.0)
Eosinophils Absolute: 0.2 10*3/uL (ref 0.0–0.7)
Eosinophils Relative: 2.8 % (ref 0.0–5.0)
HCT: 41 % (ref 36.0–46.0)
Hemoglobin: 13.8 g/dL (ref 12.0–15.0)
Lymphocytes Relative: 23.3 % (ref 12.0–46.0)
Lymphs Abs: 1.9 10*3/uL (ref 0.7–4.0)
MCHC: 33.5 g/dL (ref 30.0–36.0)
MCV: 101.4 fL — ABNORMAL HIGH (ref 78.0–100.0)
Monocytes Absolute: 0.8 10*3/uL (ref 0.1–1.0)
Monocytes Relative: 9.7 % (ref 3.0–12.0)
Neutro Abs: 5.1 10*3/uL (ref 1.4–7.7)
Neutrophils Relative %: 63.5 % (ref 43.0–77.0)
Platelets: 276 10*3/uL (ref 150.0–400.0)
RBC: 4.05 Mil/uL (ref 3.87–5.11)
RDW: 12.5 % (ref 11.5–15.5)
WBC: 8 10*3/uL (ref 4.0–10.5)

## 2023-08-06 LAB — LIPID PANEL
Cholesterol: 246 mg/dL — ABNORMAL HIGH (ref 0–200)
HDL: 46.7 mg/dL (ref >39.00)
Total CHOL/HDL Ratio: 5
Triglycerides: 451 mg/dL — ABNORMAL HIGH (ref 0.0–149.0)

## 2023-08-06 LAB — COMPREHENSIVE METABOLIC PANEL
ALT: 20 U/L (ref 0–35)
AST: 17 U/L (ref 0–37)
Albumin: 4.2 g/dL (ref 3.5–5.2)
Alkaline Phosphatase: 59 U/L (ref 39–117)
BUN: 22 mg/dL (ref 6–23)
CO2: 27 meq/L (ref 19–32)
Calcium: 9.8 mg/dL (ref 8.4–10.5)
Chloride: 102 meq/L (ref 96–112)
Creatinine, Ser: 0.91 mg/dL (ref 0.40–1.20)
GFR: 64.55 mL/min (ref >60.00)
Glucose, Bld: 91 mg/dL (ref 70–99)
Potassium: 3.8 meq/L (ref 3.5–5.1)
Sodium: 136 meq/L (ref 135–145)
Total Bilirubin: 0.4 mg/dL (ref 0.2–1.2)
Total Protein: 7.2 g/dL (ref 6.0–8.3)

## 2023-08-06 LAB — LDL CHOLESTEROL, DIRECT: Direct LDL: 153 mg/dL

## 2023-08-06 LAB — HEMOGLOBIN A1C: Hgb A1c MFr Bld: 6 % (ref 4.6–6.5)

## 2023-08-06 MED ORDER — CLONAZEPAM 0.5 MG PO TABS: ORAL_TABLET | ORAL | 2 refills | Status: DC

## 2023-08-06 NOTE — Assessment & Plan Note (Signed)
Chronic DEXA  due-ordered and can get done in 6 months Continue regular exercise Continue calcium and vitamin D daily

## 2023-08-06 NOTE — Assessment & Plan Note (Signed)
Chronic Controlled, Stable Continue clonazepam 0.5 mg at bedtime as needed Gabapentin 100-300 mg at bedtime-usually takes 200 mg at night as needed-does not take often

## 2023-08-06 NOTE — Assessment & Plan Note (Signed)
Chronic Lab Results  Component Value Date   HGBA1C 6.1 02/05/2023   Check a1c Low sugar / carb diet Stressed regular exercise

## 2023-08-06 NOTE — Assessment & Plan Note (Signed)
Chronic Blood pressure well controlled CMP, CBC Continue losartan-HCTZ 100-25 mg daily

## 2023-08-06 NOTE — Assessment & Plan Note (Signed)
Chronic Regular exercise and healthy diet encouraged Check lipid panel, CMP coronary calcium score of 1 Continue lifestyle control

## 2023-09-09 NOTE — Progress Notes (Unsigned)
   [address] [place] [address] Dept: [phone]  FOLLOW UP NOTE  Patient ID: Samantha Shepard [facility], female    DOB: 1955-12-11  Age: 67 y.o. MRN: [id number] Date of Office Visit: 09/10/2023  Assessment  Chief Complaint: No chief complaint on file.  HPI Samantha Shepard is a 67 year old female who presents to the clinic for follow-up visit.  She was last seen in this clinic on 04/02/2023 by Thermon Leyland, FNP, for evaluation of asthma, allergic rhinitis, and reflux.  Discussed the use of AI scribe software for clinical note transcription with the patient, who gave verbal consent to proceed.  History of Present Illness             Drug Allergies:  Allergies  Allergen Reactions   Sulfonamide Derivatives     REACTION: rash   Codeine     REACTION: Excitability--can't sleep    Physical Exam: There were no vitals taken for this visit.   Physical Exam  Diagnostics:    Assessment and Plan: No diagnosis found.  No orders of the defined types were placed in this encounter.   There are no Patient Instructions on file for this visit.  No follow-ups on file.    Thank you for the opportunity to care for this patient.  Please do not hesitate to contact me with questions.  Thermon Leyland, FNP [facility]

## 2023-09-09 NOTE — Patient Instructions (Incomplete)
Asthma/cough Continue albuterol 2 puffs once every 4 hours as needed You may use albuterol 2 puffs 5-15 minutes before activity to decrease cough or wheeze For asthma flare, begin Breo 200-1 puff once a day for 1-2 weeks or until cough and wheeze free  Allergic rhinitis Begin Flonase or Nasacort 2 sprays in each nostril once a day as needed for a stuffy nose Contiue Xyzal (levocetirizine) 2.5-5 mg once a day as needed for runny nose/itching Continue ipratroprium nasal spray 2 sprays in each nostril twice a day as needed for a runny nose Consider saline nasal rinses as needed for nasal symptoms. Use this before any medicated nasal sprays for best result Consider updating your environmental allergy testing.  Remember to stop your antihistamines for 3 days before your testing appointment  Reflux Continue dietary and lifestyle modifications  Begin famotidine 20 mg once or twice a day to control reflux. This will replace esomeprazole and omeprazole  Call the clinic if this treatment plan is not working well for you  Follow up in 6 months or sooner if needed.

## 2023-09-10 ENCOUNTER — Encounter: Payer: Self-pay | Admitting: Family Medicine

## 2023-09-10 ENCOUNTER — Ambulatory Visit: Payer: Medicare PPO | Admitting: Family Medicine

## 2023-09-10 VITALS — BP 130/80 | HR 85 | Temp 98.6°F | Resp 18

## 2023-09-10 DIAGNOSIS — J3089 Other allergic rhinitis: Secondary | ICD-10-CM

## 2023-09-10 DIAGNOSIS — J4540 Moderate persistent asthma, uncomplicated: Secondary | ICD-10-CM | POA: Diagnosis not present

## 2023-09-10 DIAGNOSIS — K219 Gastro-esophageal reflux disease without esophagitis: Secondary | ICD-10-CM | POA: Diagnosis not present

## 2023-09-10 DIAGNOSIS — R051 Acute cough: Secondary | ICD-10-CM

## 2023-09-10 MED ORDER — PREDNISONE 10 MG PO TABS: ORAL_TABLET | ORAL | 0 refills | Status: DC

## 2023-09-11 ENCOUNTER — Other Ambulatory Visit: Payer: Self-pay | Admitting: Family Medicine

## 2023-09-11 MED ORDER — RYALTRIS 665-25 MCG/ACT NA SUSP: 2.0000 | Freq: Two times a day (BID) | NASAL | 5 refills | Status: AC

## 2023-09-11 NOTE — Telephone Encounter (Signed)
Last AVS stated to start Ryaltris and continue ipratropium. No prescription looks like it was sent in for the Ryaltris. Sending prescription to specialty pharmacy.

## 2023-09-24 ENCOUNTER — Ambulatory Visit: Payer: Medicare PPO | Admitting: Internal Medicine

## 2023-11-17 ENCOUNTER — Other Ambulatory Visit: Payer: Self-pay | Admitting: Family Medicine

## 2023-12-09 ENCOUNTER — Ambulatory Visit: Payer: Medicare PPO | Admitting: Internal Medicine

## 2023-12-09 NOTE — Patient Instructions (Incomplete)
 Asthma Continue albuterol 2 puffs once every 4 hours as needed You may use albuterol 2 puffs 5-15 minutes before activity to decrease cough or wheeze For now and for asthma flare, begin Breo 200-1 puff once a day for 1-2 weeks or until cough and wheeze free  Allergic rhinitis Contiue Xyzal (levocetirizine) 2.5-5 mg once a day as needed for runny nose/itching Continue ipratroprium nasal spray 2 sprays in each nostril twice a day as needed for a runny nose Consider saline nasal rinses as needed for nasal symptoms. Use this before any medicated nasal sprays for best result Consider nasal saline gel if needed for dry nostrils Consider updating your environmental allergy testing.  Remember to stop your antihistamines for 3 days before your testing appointment  Reflux Continue dietary and lifestyle modifications  Continue famotidine 20 mg once or twice a day to control reflux.   Call the clinic if this treatment plan is not working well for you  Follow up in 6 months or sooner if needed.

## 2023-12-09 NOTE — Progress Notes (Signed)
 [address] [place] [address] Dept: [phone]  FOLLOW UP NOTE  Patient ID: Samantha Shepard [facility], female    DOB: [birth year]  Age: 68 y.o. MRN: [id number] Date of Office Visit: 12/10/2023  Assessment  Chief Complaint: Asthma  HPI Samantha Shepard is a 68 year old female who presents to the clinic for follow-up visit.  She was last seen in this clinic on 09/10/2023 by Thermon Leyland, FNP, for evaluation of asthma, allergic rhinitis, reflux, and cough requiring prednisone taper for relief of symptoms.  At today's visit, she reports her symptoms been moderately well-controlled with symptoms including chest tightness that began about 1 week ago for which she began using Breo 200-1 puff once a day with relief of symptoms.  She denies wheeze with activity or rest.  She does report chronic cough producing clear mucus which has not changed in nature over the last week.  He infrequently uses albuterol for relief of asthma symptoms.  Allergic rhinitis is reported as moderately well-controlled with clear rhinorrhea occurring in the morning and occasional sneezing as the main symptoms.  She continues ipratropium daily and is not currently using Ryaltris or nasal saline rinses.  She continues Xyzol as needed with relief of symptoms.  Reflux is reported as well-controlled with no symptoms including heartburn or vomiting.  She continues famotidine 20 mg once a day and infrequently takes famotidine 20 mg twice a day with relief of symptoms.  Her current medications are listed in the chart.  Drug Allergies:  Allergies  Allergen Reactions   Sulfonamide Derivatives     REACTION: rash   Codeine     REACTION: Excitability--can't sleep    Physical Exam: BP 120/70   Pulse 79   Temp (!) 96.8 F (36 C) (Temporal)   Resp 18   SpO2 98%    Physical Exam Vitals reviewed.  Constitutional:      Appearance: Normal appearance.  HENT:     Head: Normocephalic and atraumatic.     Right  Ear: Tympanic membrane normal.     Left Ear: Tympanic membrane normal.     Nose:     Comments: Bilateral nares slightly erythematous with thin clear nasal drainage noted.  Pharynx normal.  Ears normal.  Eyes normal.    Mouth/Throat:     Pharynx: Oropharynx is clear.  Eyes:     Conjunctiva/sclera: Conjunctivae normal.  Cardiovascular:     Rate and Rhythm: Normal rate and regular rhythm.     Heart sounds: Normal heart sounds. No murmur heard. Pulmonary:     Effort: Pulmonary effort is normal.     Breath sounds: Normal breath sounds.     Comments: Lungs clear to auscultation Musculoskeletal:        General: Normal range of motion.     Cervical back: Normal range of motion and neck supple.  Skin:    General: Skin is warm and dry.  Neurological:     Mental Status: She is alert and oriented to person, place, and time.  Psychiatric:        Mood and Affect: Mood normal.        Behavior: Behavior normal.        Thought Content: Thought content normal.        Judgment: Judgment normal.     Assessment and Plan: 1. Mild persistent asthma without complication   2. Other allergic rhinitis   3. Gastroesophageal reflux disease, unspecified whether esophagitis present     Meds ordered this encounter  Medications   BREO ELLIPTA 200-25 MCG/ACT AEPB    Sig: Inhale 1 puff into the lungs daily.    Dispense:  60 each    Refill:  5   montelukast (SINGULAIR) 10 MG tablet    Sig: Take 1 tablet (10 mg total) by mouth at bedtime.    Dispense:  30 tablet    Refill:  5    Patient Instructions  Asthma Continue albuterol 2 puffs once every 4 hours as needed You may use albuterol 2 puffs 5-15 minutes before activity to decrease cough or wheeze For now and for asthma flare, begin Breo 200-1 puff once a day for 1-2 weeks or until cough and wheeze free  Allergic rhinitis Contiue Xyzal (levocetirizine) 2.5-5 mg once a day as needed for runny nose/itching Continue ipratroprium nasal spray 2 sprays  in each nostril twice a day as needed for a runny nose Consider saline nasal rinses as needed for nasal symptoms. Use this before any medicated nasal sprays for best result Consider nasal saline gel if needed for dry nostrils Consider updating your environmental allergy testing.  Remember to stop your antihistamines for 3 days before your testing appointment  Reflux Continue dietary and lifestyle modifications  Continue famotidine 20 mg once or twice a day to control reflux.   Call the clinic if this treatment plan is not working well for you  Follow up in 6 months or sooner if needed.  Return in about 6 months (around 06/11/2024), or if symptoms worsen or fail to improve.    Thank you for the opportunity to care for this patient.  Please do not hesitate to contact me with questions.  Thermon Leyland, FNP [facility]

## 2023-12-10 ENCOUNTER — Encounter: Payer: Self-pay | Admitting: Family Medicine

## 2023-12-10 ENCOUNTER — Ambulatory Visit: Payer: Medicare PPO | Admitting: Family Medicine

## 2023-12-10 VITALS — BP 120/70 | HR 79 | Temp 96.8°F | Resp 18

## 2023-12-10 DIAGNOSIS — K219 Gastro-esophageal reflux disease without esophagitis: Secondary | ICD-10-CM

## 2023-12-10 DIAGNOSIS — J4530 Mild persistent asthma, uncomplicated: Secondary | ICD-10-CM

## 2023-12-10 DIAGNOSIS — J3089 Other allergic rhinitis: Secondary | ICD-10-CM

## 2023-12-10 MED ORDER — MONTELUKAST SODIUM 10 MG PO TABS: 10.0000 mg | ORAL_TABLET | Freq: Every day | ORAL | 5 refills | Status: DC

## 2023-12-10 MED ORDER — BREO ELLIPTA 200-25 MCG/ACT IN AEPB: 1.0000 | INHALATION_SPRAY | Freq: Every day | RESPIRATORY_TRACT | 5 refills | Status: DC

## 2023-12-16 ENCOUNTER — Encounter: Payer: Self-pay | Admitting: Internal Medicine

## 2023-12-16 ENCOUNTER — Other Ambulatory Visit: Payer: Self-pay | Admitting: Internal Medicine

## 2023-12-16 DIAGNOSIS — Z9889 Other specified postprocedural states: Secondary | ICD-10-CM

## 2023-12-20 ENCOUNTER — Other Ambulatory Visit: Payer: Self-pay | Admitting: Internal Medicine

## 2023-12-20 DIAGNOSIS — Z1231 Encounter for screening mammogram for malignant neoplasm of breast: Secondary | ICD-10-CM

## 2023-12-20 DIAGNOSIS — Z9889 Other specified postprocedural states: Secondary | ICD-10-CM

## 2023-12-25 ENCOUNTER — Encounter

## 2023-12-31 ENCOUNTER — Ambulatory Visit
Admission: RE | Admit: 2023-12-31 | Discharge: 2023-12-31 | Disposition: A | Discharge status: Home / Self Care | Source: Ambulatory Visit | Attending: Internal Medicine

## 2023-12-31 DIAGNOSIS — Z9889 Other specified postprocedural states: Secondary | ICD-10-CM

## 2023-12-31 DIAGNOSIS — Z1231 Encounter for screening mammogram for malignant neoplasm of breast: Secondary | ICD-10-CM | POA: Diagnosis not present

## 2024-01-25 DIAGNOSIS — H66001 Acute suppurative otitis media without spontaneous rupture of ear drum, right ear: Secondary | ICD-10-CM | POA: Diagnosis not present

## 2024-01-25 DIAGNOSIS — B9689 Other specified bacterial agents as the cause of diseases classified elsewhere: Secondary | ICD-10-CM | POA: Diagnosis not present

## 2024-01-25 DIAGNOSIS — J019 Acute sinusitis, unspecified: Secondary | ICD-10-CM | POA: Diagnosis not present

## 2024-02-03 ENCOUNTER — Other Ambulatory Visit: Payer: Self-pay | Admitting: Internal Medicine

## 2024-02-03 DIAGNOSIS — E2839 Other primary ovarian failure: Secondary | ICD-10-CM

## 2024-02-03 DIAGNOSIS — M85851 Other specified disorders of bone density and structure, right thigh: Secondary | ICD-10-CM

## 2024-02-03 DIAGNOSIS — N182 Chronic kidney disease, stage 2 (mild): Secondary | ICD-10-CM | POA: Insufficient documentation

## 2024-02-03 NOTE — Patient Instructions (Addendum)
      Blood work was ordered.       Medications changes include :   None    A referral was ordered behavioral health in [place] and someone will call you to schedule an appointment.     Return in about 6 months (around 08/05/2024) for Physical Exam, Schedule DEXA-[facility].

## 2024-02-03 NOTE — Progress Notes (Unsigned)
 Subjective:    Patient ID: Samantha Shepard, female    DOB: 12/16/1955, 68 y.o.   MRN: [id number]     HPI Samantha Shepard is here for follow up of her chronic medical problems.  Sinus infection the other day -- took augmentin  x 6 days - diarrhea, prednisone .  She was given a full 10 days of the Augmentin , but was not able to tolerate it more than 6 days.  Her symptoms have improved-she is not sure if she needs additional medication or not.   Sleep-she is taking clonazepam  or gabapentin  as needed only  Gym - 2 / week  Medications and allergies reviewed with patient and updated if appropriate.  Current Outpatient Medications on File Prior to Visit  Medication Sig Dispense Refill   albuterol  (VENTOLIN  HFA) 108 (90 Base) MCG/ACT inhaler Inhale 2 puffs into the lungs every 4 (four) hours as needed for wheezing or shortness of breath. 18 g 1   BREO ELLIPTA  200-25 MCG/ACT AEPB Inhale 1 puff into the lungs daily. 60 each 5   famotidine  (PEPCID ) 20 MG tablet TAKE 1 TABLET BY MOUTH TWICE A DAY 180 tablet 1   fluticasone  (FLONASE ) 50 MCG/ACT nasal spray PLACE 2 SPRAYS INTO BOTH NOSTRILS DAILY AS NEEDED FOR ALLERGIES OR RHINITIS. 48 mL 1   ipratropium (ATROVENT ) 0.06 % nasal spray Place 2 sprays into both nostrils 3 (three) times daily. 15 mL 5   levocetirizine (XYZAL ) 5 MG tablet Take 1 tablet once a day as needed for runny nose/itching. 30 tablet 5   losartan -hydrochlorothiazide (HYZAAR) 100-25 MG tablet TAKE 1 TABLET BY MOUTH DAILY 90 tablet 3   montelukast  (SINGULAIR ) 10 MG tablet Take 1 tablet (10 mg total) by mouth at bedtime. 30 tablet 5   Olopatadine-Mometasone  (RYALTRIS ) 665-25 MCG/ACT SUSP Place 2 sprays into the nose 2 (two) times daily. 29 g 5   No current facility-administered medications on file prior to visit.     Review of Systems  Constitutional:  Negative for fever.  HENT:  Positive for congestion, ear pain (R > L), hearing loss (right ear), postnasal drip and  sore throat (PND).   Respiratory:  Positive for cough. Negative for shortness of breath and wheezing.   Cardiovascular:  Positive for chest pain (occ). Negative for palpitations and leg swelling.  Neurological:  Positive for headaches (mild). Negative for dizziness and light-headedness.  Psychiatric/Behavioral:  Positive for sleep disturbance. Negative for dysphoric mood.        Objective:   Vitals:   02/04/24 1133  BP: 132/70  Pulse: 81  Temp: 98 F (36.7 C)  SpO2: 99%   BP Readings from Last 3 Encounters:  02/04/24 132/70  12/10/23 120/70  09/10/23 130/80   Wt Readings from Last 3 Encounters:  12/18/22 155 lb (70.3 kg)  10/18/22 154 lb 1.6 oz (69.9 kg)  09/18/22 156 lb 12.8 oz (71.1 kg)   Body mass index is 28.35 kg/m.    Physical Exam Constitutional:      General: She is not in acute distress.    Appearance: Normal appearance.  HENT:     Head: Normocephalic and atraumatic.     Right Ear: Tympanic membrane, ear canal and external ear normal. There is no impacted cerumen.     Left Ear: Tympanic membrane, ear canal and external ear normal. There is no impacted cerumen.     Mouth/Throat:     Mouth: Mucous membranes are moist.     Pharynx: No posterior oropharyngeal  erythema.  Eyes:     Conjunctiva/sclera: Conjunctivae normal.  Cardiovascular:     Rate and Rhythm: Normal rate and regular rhythm.     Heart sounds: Normal heart sounds.  Pulmonary:     Effort: Pulmonary effort is normal. No respiratory distress.     Breath sounds: Normal breath sounds. No wheezing.  Musculoskeletal:     Cervical back: Neck supple.     Right lower leg: No edema.     Left lower leg: No edema.  Lymphadenopathy:     Cervical: No cervical adenopathy.  Skin:    General: Skin is warm and dry.     Findings: No rash.  Neurological:     Mental Status: She is alert. Mental status is at baseline.  Psychiatric:        Mood and Affect: Mood normal.        Behavior: Behavior normal.         Lab Results  Component Value Date   WBC 8.0 08/06/2023   HGB 13.8 08/06/2023   HCT 41.0 08/06/2023   PLT 276.0 08/06/2023   GLUCOSE 91 08/06/2023   CHOL 246 (H) 08/06/2023   TRIG (H) 08/06/2023    451.0 Triglyceride is over 400; calculations on Lipids are invalid.   HDL 46.70 08/06/2023   LDLDIRECT 153.0 08/06/2023   LDLCALC 142 (H) 12/09/2018   ALT 20 08/06/2023   AST 17 08/06/2023   NA 136 08/06/2023   K 3.8 08/06/2023   CL 102 08/06/2023   CREATININE 0.91 08/06/2023   BUN 22 08/06/2023   CO2 27 08/06/2023   TSH 1.33 02/05/2023   HGBA1C 6.0 08/06/2023     Assessment & Plan:    See Problem List for Assessment and Plan of chronic medical problems.

## 2024-02-04 ENCOUNTER — Encounter: Payer: Self-pay | Admitting: Internal Medicine

## 2024-02-04 ENCOUNTER — Ambulatory Visit (INDEPENDENT_AMBULATORY_CARE_PROVIDER_SITE_OTHER)
Admission: RE | Admit: 2024-02-04 | Discharge: 2024-02-04 | Disposition: A | Discharge status: Home / Self Care | Payer: Medicare PPO | Source: Ambulatory Visit | Attending: Internal Medicine | Admitting: Internal Medicine

## 2024-02-04 ENCOUNTER — Ambulatory Visit: Payer: Medicare PPO | Admitting: Internal Medicine

## 2024-02-04 VITALS — BP 132/70 | HR 81 | Temp 98.0°F | Ht 62.0 in

## 2024-02-04 DIAGNOSIS — J0120 Acute ethmoidal sinusitis, unspecified: Secondary | ICD-10-CM

## 2024-02-04 DIAGNOSIS — I10 Essential (primary) hypertension: Secondary | ICD-10-CM

## 2024-02-04 DIAGNOSIS — G479 Sleep disorder, unspecified: Secondary | ICD-10-CM

## 2024-02-04 DIAGNOSIS — N182 Chronic kidney disease, stage 2 (mild): Secondary | ICD-10-CM | POA: Diagnosis not present

## 2024-02-04 DIAGNOSIS — M85851 Other specified disorders of bone density and structure, right thigh: Secondary | ICD-10-CM | POA: Diagnosis not present

## 2024-02-04 DIAGNOSIS — M85852 Other specified disorders of bone density and structure, left thigh: Secondary | ICD-10-CM | POA: Diagnosis not present

## 2024-02-04 DIAGNOSIS — F419 Anxiety disorder, unspecified: Secondary | ICD-10-CM

## 2024-02-04 DIAGNOSIS — E7849 Other hyperlipidemia: Secondary | ICD-10-CM | POA: Diagnosis not present

## 2024-02-04 DIAGNOSIS — E2839 Other primary ovarian failure: Secondary | ICD-10-CM

## 2024-02-04 DIAGNOSIS — R7303 Prediabetes: Secondary | ICD-10-CM | POA: Diagnosis not present

## 2024-02-04 LAB — LIPID PANEL
Cholesterol: 215 mg/dL — ABNORMAL HIGH (ref 0–200)
HDL: 43.8 mg/dL (ref >39.00)
LDL Cholesterol: 107 mg/dL — ABNORMAL HIGH (ref 0–99)
NonHDL: 171.33
Total CHOL/HDL Ratio: 5
Triglycerides: 323 mg/dL — ABNORMAL HIGH (ref 0.0–149.0)
VLDL: 64.6 mg/dL — ABNORMAL HIGH (ref 0.0–40.0)

## 2024-02-04 LAB — COMPREHENSIVE METABOLIC PANEL WITH GFR
ALT: 23 U/L (ref 0–35)
AST: 19 U/L (ref 0–37)
Albumin: 4.1 g/dL (ref 3.5–5.2)
Alkaline Phosphatase: 58 U/L (ref 39–117)
BUN: 19 mg/dL (ref 6–23)
CO2: 28 meq/L (ref 19–32)
Calcium: 9.6 mg/dL (ref 8.4–10.5)
Chloride: 101 meq/L (ref 96–112)
Creatinine, Ser: 0.9 mg/dL (ref 0.40–1.20)
GFR: 65.19 mL/min (ref >60.00)
Glucose, Bld: 123 mg/dL — ABNORMAL HIGH (ref 70–99)
Potassium: 3.5 meq/L (ref 3.5–5.1)
Sodium: 135 meq/L (ref 135–145)
Total Bilirubin: 0.6 mg/dL (ref 0.2–1.2)
Total Protein: 7.1 g/dL (ref 6.0–8.3)

## 2024-02-04 LAB — HEMOGLOBIN A1C: Hgb A1c MFr Bld: 6.2 % (ref 4.6–6.5)

## 2024-02-04 MED ORDER — CLONAZEPAM 0.5 MG PO TABS
ORAL_TABLET | ORAL | 2 refills | Status: AC
Start: 2024-02-04 — End: ?

## 2024-02-04 MED ORDER — GABAPENTIN 100 MG PO CAPS: ORAL_CAPSULE | ORAL | 3 refills | Status: AC

## 2024-02-04 NOTE — Assessment & Plan Note (Signed)
 Acute She did complete 6 days of Augmentin  and her symptoms are better, but not completely gone She also has significant allergies which is not new and a lot of her symptoms could be related to that No significant symptoms of active infection so no antibiotic at this time-continue allergy medications and she will update me in a month how her symptoms change

## 2024-02-04 NOTE — Assessment & Plan Note (Signed)
 Chronic Regular exercise and healthy diet encouraged Check lipid panel, CMP coronary calcium score of 1 Continue lifestyle control

## 2024-02-04 NOTE — Assessment & Plan Note (Signed)
 Chronic Mildly decreased GFR-stable CMP Stressed increased water intake, avoiding NSAIDs Stressed keeping blood sugar and BP well-controlled Encouraged regular exercise, healthy diet low in sugars needs sodium

## 2024-02-04 NOTE — Assessment & Plan Note (Signed)
Chronic Controlled, Stable Continue clonazepam 0.5 mg at bedtime as needed Gabapentin 100-300 mg at bedtime-usually takes 200 mg at night as needed-does not take often

## 2024-02-04 NOTE — Assessment & Plan Note (Signed)
 Chronic Controlled, Stable Continue clonazepam  0.5 mg at bedtime as needed Does have some mild generalized anxiety and wonders if talking to a therapist might help-I do think this would help-referral ordered

## 2024-02-04 NOTE — Assessment & Plan Note (Signed)
Chronic DEXA due-ordered Continue regular exercise Continue calcium and vitamin D daily 

## 2024-02-04 NOTE — Assessment & Plan Note (Signed)
 Chronic Lab Results  Component Value Date   HGBA1C 6.0 08/06/2023   Check a1c Low sugar / carb diet Stressed regular exercise

## 2024-02-04 NOTE — Assessment & Plan Note (Signed)
Chronic Blood pressure well controlled CMP Continue losartan-HCTZ 100-25 mg daily 

## 2024-02-08 ENCOUNTER — Encounter: Payer: Self-pay | Admitting: Internal Medicine

## 2024-02-13 ENCOUNTER — Other Ambulatory Visit: Payer: Self-pay | Admitting: Internal Medicine

## 2024-03-17 ENCOUNTER — Ambulatory Visit: Payer: Self-pay | Admitting: Psychology

## 2024-03-17 DIAGNOSIS — F419 Anxiety disorder, unspecified: Secondary | ICD-10-CM

## 2024-03-17 NOTE — Progress Notes (Unsigned)
 [facility] Behavioral Health Counselor Initial Adult Exam  Name: Samantha Shepard [facility] Date: 03/17/2024 MRN: [id number] DOB: [birth year] PCP: Colene Dauphin, MD  Time Spent: 3:00 pm - 3:57 :57 minutes  Guardian/Payee:  Self    Paperwork requested: No   Reason for Visit /Presenting Problem: My husband, Samantha Shepard, gives his 70 year old son, Samantha Shepard, everything and anything he wants.   Mental Status Exam: Appearance:   Well Groomed     Behavior:  Appropriate  Motor:  Normal  Speech/Language:   Normal Rate  Affect:  Appropriate  Mood:  normal  Thought process:  normal  Thought content:    WNL  Sensory/Perceptual disturbances:    WNL  Orientation:  oriented to person, place, and time/date  Attention:  Good  Concentration:  Good  Memory:  WNL  Fund of knowledge:   Good  Insight:    Good  Judgment:   Good  Impulse Control:  Good   Reported Symptoms:  Anxiety  Risk Assessment: Danger to Self:  No Self-injurious Behavior: No Danger to Others: No Duty to Warn:no Physical Aggression / Violence:No  Access to Firearms a concern: No  Gang Involvement:No  Patient / guardian was educated about steps to take if suicide or homicide risk level increases between visits: yes While future psychiatric events cannot be accurately predicted, the patient does not currently require acute inpatient psychiatric care and does not currently meet [place]  involuntary commitment criteria.  Substance Abuse History: Current substance abuse: No     Caffeine: Tobacco: Alcohol: Substance use:  Past Psychiatric History:   No previous psychological problems have been observed Outpatient Providers:No History of Psych Hospitalization: No  Psychological Testing: No   Abuse History:  Victim of: No. Report needed: No. Victim of Neglect:No. Perpetrator of No  Witness / Exposure to Domestic Violence: No   Protective Services Involvement: No  Witness to [employer] Violence:  No   Family History:   Family History  Problem Relation Age of Onset   Heart disease Mother        CABG 3 vessel @ 43   Hypertension Mother    Hyperlipidemia Mother    Diabetes Mother    Stroke Mother 74   Prostate cancer Father    Hypertension Father    Breast cancer Sister    Hyperlipidemia Sister    Breast cancer Paternal Aunt 8 - 16   Allergic rhinitis Neg Hx    Asthma Neg Hx    Angioedema Neg Hx    Eczema Neg Hx    Immunodeficiency Neg Hx    Urticaria Neg Hx     Living situation: the patient lives with their spouse  Sexual Orientation: Straight  Relationship Status: married  Name of spouse / other:Samantha Shepard for six years, ex husband was also named Samantha Shepard If a parent, number of children / ages:Step son Samantha Shepard, 80 years old.  Support Systems: friends  Financial Stress:  No   Income/Employment/Disability: Retired  Financial planner: No   Educational History: Education: some college  Religion/Sprituality/World View: Spiritual  Any cultural differences that may affect / interfere with treatment:  not applicable   Recreation/Hobbies: various hobbies  Stressors: Marital or family conflict    Strengths: Supportive Relationships, Hopefulness, Journalist, newspaper, and Able to Communicate Effectively  Barriers:  No    Legal History: Pending legal issue / charges: The patient has no significant history of legal issues. History of legal issue / charges: N/A  Medical History/Surgical History: not reviewed Past Medical History:  Diagnosis Date   Allergy    dust, cats   Anxiety    Asthma    controlled   Hyperlipidemia    Hypertension    Left breast mass    Migraines     Past Surgical History:  Procedure Laterality Date   BREAST BIOPSY  10/17/2022   MM RT RADIOACTIVE SEED LOC MAMMO GUIDE 10/17/2022 [facility] MAMMOGRAPHY   BREAST EXCISIONAL BIOPSY Left 06/2019   benign   BREAST LUMPECTOMY WITH RADIOACTIVE SEED LOCALIZATION Left 07/08/2019   Procedure: LEFT BREAST LUMPECTOMY WITH RADIOACTIVE  SEED LOCALIZATION;  Surgeon: Caralyn Chandler, MD;  Location: [facility];  Service: General;  Laterality: Left;   BREAST LUMPECTOMY WITH RADIOACTIVE SEED LOCALIZATION Right 10/18/2022   Procedure: RIGHT BREAST LUMPECTOMY WITH RADIOACTIVE SEED LOCALIZATION;  Surgeon: Caralyn Chandler, MD;  Location: [facility];  Service: General;  Laterality: Right;   COLONOSCOPY  2009   neg; [facility]   thyroid  cyst aspirated     TONSILLECTOMY      Medications: Current Outpatient Medications  Medication Sig Dispense Refill   albuterol  (VENTOLIN  HFA) 108 (90 Base) MCG/ACT inhaler Inhale 2 puffs into the lungs every 4 (four) hours as needed for wheezing or shortness of breath. 18 g 1   BREO ELLIPTA  200-25 MCG/ACT AEPB Inhale 1 puff into the lungs daily. 60 each 5   clonazePAM  (KLONOPIN ) 0.5 MG tablet TAKE 1 TABLET BY MOUTH AT BEDTIME AS NEEDED FOR ANXIETY OR TMJ 30 tablet 2   famotidine  (PEPCID ) 20 MG tablet TAKE 1 TABLET BY MOUTH TWICE A DAY 180 tablet 1   fluticasone  (FLONASE ) 50 MCG/ACT nasal spray PLACE 2 SPRAYS INTO BOTH NOSTRILS DAILY AS NEEDED FOR ALLERGIES OR RHINITIS. 48 mL 1   gabapentin  (NEURONTIN ) 100 MG capsule TAKE 1-3 CAPSULES BY MOUTH AT BEDTIME 60 capsule 3   ipratropium (ATROVENT ) 0.06 % nasal spray Place 2 sprays into both nostrils 3 (three) times daily. 15 mL 5   levocetirizine (XYZAL ) 5 MG tablet Take 1 tablet once a day as needed for runny nose/itching. 30 tablet 5   losartan -hydrochlorothiazide (HYZAAR) 100-25 MG tablet TAKE 1 TABLET BY MOUTH DAILY 90 tablet 3   montelukast  (SINGULAIR ) 10 MG tablet Take 1 tablet (10 mg total) by mouth at bedtime. 30 tablet 5   Olopatadine-Mometasone  (RYALTRIS ) 665-25 MCG/ACT SUSP Place 2 sprays into the nose 2 (two) times daily. 29 g 5   No current facility-administered medications for this visit.    Allergies  Allergen Reactions   Sulfonamide Derivatives     REACTION: rash   Codeine     REACTION: Excitability--can't  sleep   Augmentin  [Amoxicillin -Pot Clavulanate] Diarrhea    Diagnoses:  Anxiety [F41.9]   Psychiatric Treatment: No ,   Plan of Care: OPT  Narrative:  Samantha Shepard participated from office with therapist and consented to treatment. We reviewed the limits of confidentiality prior to the start of the evaluation. Samantha Shepard expressed understanding and agreement to proceed. Patient is a 68 year old female who presented for an initial assessment. Patient denied current and past suicidal ideation, homicidal ideation, and symptoms of psychosis. Patient denied current or past alcohol, tobacco, or drug use. Patient reported current stressors as family dynamics. Patient identified current supports as her friends. Patient reported  I'm getting old, I'm a 56 year old trapped in a 68 year old body. I am 67 years old and I guess I associate being 18 with not being able to  move around.  Decrepid? One foot in the grave? A [place] visit seemed to set things in motion.  First marriage lasted 20 years to first [place] who was an alcoholic. Curly had wanted to be first [employer], but instead ended up being her lifesaver.Patient's 61 year old step son, Samantha Shepard, can't read or write, has an IQ of 28, and has a dx of ADHD. Ben and [facility] have been together living at the house for 6 years (not married, no common law in [place]) that Ben's father pays for, and patient describes as dirty.Patient's husband of 6 years smokes pot even though he had heart surgery and his memory. It is recommended that patient participate in outpatient, individual, psychotherapy.   A follow-up was scheduled to create a treatment plan and begin treatment. Therapist answered  and all questions during the evaluation and contact information was provided.     Samantha Shepard

## 2024-03-21 ENCOUNTER — Other Ambulatory Visit: Payer: Self-pay | Admitting: Family Medicine

## 2024-03-31 ENCOUNTER — Ambulatory Visit (INDEPENDENT_AMBULATORY_CARE_PROVIDER_SITE_OTHER): Admitting: Psychology

## 2024-03-31 DIAGNOSIS — F419 Anxiety disorder, unspecified: Secondary | ICD-10-CM | POA: Diagnosis not present

## 2024-03-31 NOTE — Progress Notes (Signed)
 [facility] Counselor/Therapist Progress Note  Patient ID: Samantha Shepard, MRN: [id number]    Date: 03/31/24  Time Spent: 10:00 am -10:57 am   : 57 minutes    Treatment Type: Individual Therapy.  Reported Symptoms: Anxiety  Mental Status Exam: Appearance:  Well Groomed     Behavior: Appropriate  Motor: Normal  Speech/Language:  Normal Rate  Affect: Appropriate  Mood: normal  Thought process: normal  Thought content:   WNL  Sensory/Perceptual disturbances:   WNL  Orientation: oriented to person, place, and time/date  Attention: Good  Concentration: Good  Memory: WNL  Fund of knowledge:  Good  Insight:   Good  Judgment:  Good  Impulse Control: Good   Risk Assessment: Danger to Self:  No Self-injurious Behavior: No Danger to Others: No Duty to Warn:no Physical Aggression / Violence:No  Access to Firearms a concern: No  Gang Involvement:No   Subjective:   Samantha Shepard participated in the session, in person in the office with the therapist, and consented to treatment Samantha Shepard reviewed the events of the past week.   Samantha Shepard's eldest son Samantha Shepard died one year ago.  Samantha Shepard's other son Samantha Shepard is 40, smart but can't read nr write.   We reviewed numerous treatment approaches including CBT, BA, Problem Solving, and Solution focused therapy.   Psych-education regarding the Samantha Shepard's diagnosis of No diagnosis found. was provided during the session.   We discussed Samantha Shepard's goals treatment goals which include   Stop worrying about every little thing (dead and in a ditch)  2.   Stop trying to take control of Samantha Shepard's health.Samantha Shepard provided verbal approval of the treatment plan.   Interventions: Psycho-education & Goal Setting.   Diagnosis:   Anxiety [F41.9]   Psychiatric Treatment: No   Treatment Plan:  Client Abilities/Strengths Samantha Shepard is bright, eloquent, and motivated to make changes.    Support System: Friend  Client Treatment Preferences OPT  Client Statement of Needs Samantha Shepard would like to decrease her worrying behavior and to stop trying to control what is out of her hands.    Treatment Level Weekly  Symptoms  Anxiety (Status: maintained)  Goals:   Samantha Shepard symptoms of Anxiety  Treatment plan signed and available on s-drive:  No    Target Date: 03/17/25 Frequency: Weekly  Progress: 0 Modality: individual    Therapist will provide referrals for additional resources as appropriate.  Therapist will provide psycho-education regarding Samantha Shepard's diagnosis and corresponding treatment approaches and interventions. Samantha Shepard will support the patient's ability to achieve the goals identified. will employ CBT, BA, Problem-solving, Solution Focused, Mindfulness,  coping skills, & other evidenced-based practices will be used to promote progress towards healthy functioning to help manage decrease symptoms associated with their diagnosis.   Reduce overall level, frequency, and intensity of the feelings of depression, anxiety and panic evidenced by decreased overall symptoms from 6 to 7 days/week to 0 to 1 days/week per client report for at least 3 consecutive months. Verbally express understanding of the relationship between feelings of anxiety and their impact on thinking patterns and behaviors. Verbalize an understanding of the role that distorted thinking plays in creating fears, excessive worry, and ruminations.  Samantha Shepard participated in the creation of the treatment plan)    Samantha Shepard

## 2024-04-14 ENCOUNTER — Ambulatory Visit (INDEPENDENT_AMBULATORY_CARE_PROVIDER_SITE_OTHER): Admitting: Psychology

## 2024-04-14 DIAGNOSIS — F419 Anxiety disorder, unspecified: Secondary | ICD-10-CM | POA: Diagnosis not present

## 2024-04-14 NOTE — Progress Notes (Signed)
 [facility] Counselor/Therapist Progress Note  Patient ID: Samantha Shepard, MRN: [id number]    Date: 04/14/24  Time Spent: 12:00 pm - 12:55 pm  : 55 minutes     Treatment Type: Individual Therapy.  Reported Symptoms: Anxiety  Mental Status Exam: Appearance:  Well Groomed     Behavior: Appropriate  Motor: Normal  Speech/Language:  Normal Rate  Affect: Appropriate  Mood: normal  Thought process: normal  Thought content:   WNL  Sensory/Perceptual disturbances:   WNL  Orientation: oriented to person, place, and time/date  Attention: Good  Concentration: Good  Memory: WNL  Fund of knowledge:  Good  Insight:   Good  Judgment:  Good  Impulse Control: Good   Risk Assessment: Danger to Self:  No Self-injurious Behavior: No Danger to Others: No Duty to Warn:no Physical Aggression / Violence:No  Access to Firearms a concern: No  Gang Involvement:No   Subjective:   Samantha Shepard participated in the session, in person in the office with the therapist, and consented to treatment. Aritza reviewed the events of the past week.   Patient reported that her husband has made progress with regard to setting limits on his son [name]. Patient made progress on putting a down payment on a place in [place] to have a place to fall back on as her husband gets older. It is important to patient to always have a goal to make progress toward in her life. Patient   Mike's eldest adopted son [name] died one year ago.   Interventions: Cognitive Behavioral Therapy  Diagnosis:  Anxiety [F41.9]   Psychiatric Treatment: No   Treatment Plan:  Client Abilities/Strengths Amalea is bright, eloquent, and motivated to make changes.    Support System: Friends  Hospital doctor Preferences OPT  Client Statement of Needs Bonne would like to decrease her worrying behavior and to stop trying to control what is out of her hands.     Treatment  Level Weekly  Symptoms  Anxiety (Status: maintained)      Goals:   Asma experiences symptoms of anxiety.   Target Date: 03/17/25 Frequency: Weekly  Progress: 0 Modality: individual    Therapist will provide referrals for additional resources as appropriate.  Therapist will provide psycho-education regarding Clova's diagnosis and corresponding treatment approaches and interventions. [name] will support the patient's ability to achieve the goals identified. will employ CBT, BA, Problem-solving, Solution Focused, Mindfulness,  coping skills, & other evidenced-based practices will be used to promote progress towards healthy functioning to help manage decrease symptoms associated with their diagnosis.   Reduce overall level, frequency, and intensity of the feelings of depression, anxiety and panic evidenced by decreased overall symptoms from 6 to 7 days/week to 0 to 1 days/week per client report for at least 3 consecutive months. Verbally express understanding of the relationship between feelings of d anxiety and their impact on thinking patterns and behaviors. Verbalize an understanding of the role that distorted thinking plays in creating fears, excessive worry, and ruminations.  [name] participated in the creation of the treatment plan)    [name]

## 2024-04-21 ENCOUNTER — Ambulatory Visit: Admitting: Psychology

## 2024-04-21 DIAGNOSIS — F419 Anxiety disorder, unspecified: Secondary | ICD-10-CM | POA: Diagnosis not present

## 2024-04-21 NOTE — Progress Notes (Unsigned)
 [facility] Counselor/Therapist Progress Note  Patient ID: Samantha Shepard, MRN: [id number]    Date: 04/21/24  Time Spent: 10:00 am - 10:53 am : 53 minutes  Treatment Type: Individual Therapy.  Reported Symptoms: Anxiety  Mental Status Exam: Appearance:  Well Groomed     Behavior: Appropriate  Motor: Normal  Speech/Language:  Normal Rate  Affect: Appropriate  Mood: normal  Thought process: normal  Thought content:   WNL  Sensory/Perceptual disturbances:   WNL  Orientation: oriented to person, place, and time/date  Attention: Good  Concentration: Good  Memory: WNL  Fund of knowledge:  Good  Insight:   Good  Judgment:  Good  Impulse Control: Good   Risk Assessment: Danger to Self: No Self-injurious Behavior: No Danger to Others: No Duty to Warn:no Physical Aggression / Violence:No  Access to Firearms a concern: No  Gang Involvement:No   Subjective:   Alonah Lineback Cordle participated in the session, in person in the office with the therapist, and consented to treatment. Annayah reviewed the events of the past week.   Discussed patient's feet hurting and the impact it is having on her gym workouts. Patient discussed why she did a post nuptual. Patient reported what changed when she went from a very difficult first marriage to her second more positive marriage. Patient reported feeling anxious and on edge. We discussed techniques to mange her feeling anxious and on edge.  .  Interventions: Cognitive Behavioral Therapy  Diagnosis:  Anxiety [F41.9]   Psychiatric Treatment: No   Treatment Plan:  Client Abilities/Strengths Roselyn is bright, eloquent, and motivated to make changes.    Support System: Friends  Hospital doctor Preferences OPT  Client Statement of Needs Malillany would like to decrease her worrying behavior and to stop trying to control what is out of her hands.      Treatment Level Weekly  Symptoms  Anxiety  (Status: maintained)   Goals:   Jameah experiences symptoms of anxiety.   Target Date: 03/17/25 Frequency: Weekly  Progress: 0 Modality: individual    Therapist will provide referrals for additional resources as appropriate.  Therapist will provide psycho-education regarding [place]'s diagnosis and corresponding treatment approaches and interventions. [name] will support the patient's ability to achieve the goals identified. will employ CBT, BA, Problem-solving, Solution Focused, Mindfulness,  coping skills, & other evidenced-based practices will be used to promote progress towards healthy functioning to help manage decrease symptoms associated with their diagnosis.   Reduce overall level, frequency, and intensity of the feelings of depression, anxiety and panic evidenced by decreased overall symptoms from 6 to 7 days/week to 0 to 1 days/week per client report for at least 3 consecutive months. Verbally express understanding of the relationship between feelings of  anxiety and their impact on thinking patterns and behaviors. Verbalize an understanding of the role that distorted thinking plays in creating fears, excessive worry, and ruminations.  [name] participated in the creation of the treatment plan)    [name]

## 2024-04-22 DIAGNOSIS — H524 Presbyopia: Secondary | ICD-10-CM | POA: Diagnosis not present

## 2024-04-22 DIAGNOSIS — H2513 Age-related nuclear cataract, bilateral: Secondary | ICD-10-CM | POA: Diagnosis not present

## 2024-04-22 DIAGNOSIS — H11153 Pinguecula, bilateral: Secondary | ICD-10-CM | POA: Diagnosis not present

## 2024-04-22 DIAGNOSIS — H43393 Other vitreous opacities, bilateral: Secondary | ICD-10-CM | POA: Diagnosis not present

## 2024-04-28 ENCOUNTER — Ambulatory Visit (INDEPENDENT_AMBULATORY_CARE_PROVIDER_SITE_OTHER): Admitting: Psychology

## 2024-04-28 ENCOUNTER — Other Ambulatory Visit: Payer: Self-pay | Admitting: Family Medicine

## 2024-04-28 DIAGNOSIS — F419 Anxiety disorder, unspecified: Secondary | ICD-10-CM

## 2024-04-28 NOTE — Progress Notes (Unsigned)
 [facility] Counselor/Therapist Progress Note  Patient ID: Samantha Shepard, MRN: [id number]    Date: 04/28/24  Time Spent: 10:00 am - 10:53 am : 53 minutes  Treatment Type: Individual Therapy.  Reported Symptoms: Anxiety  Mental Status Exam: Appearance:  Well Groomed     Behavior: Appropriate  Motor: Normal  Speech/Language:  Normal Rate  Affect: Appropriate  Mood: normal  Thought process: normal  Thought content:   WNL  Sensory/Perceptual disturbances:   WNL  Orientation: oriented to person, place, and time/date  Attention: Good  Concentration: Good  Memory: WNL  Fund of knowledge:  Good  Insight:   Good  Judgment:  Good  Impulse Control: Good   Risk Assessment: Danger to Self:  No Self-injurious Behavior: No Danger to Others: No Duty to Warn:no Physical Aggression / Violence:No  Access to Firearms a concern: No  Gang Involvement:Yes   Subjective:   Mairyn Lenahan Billet participated in the session, in person in the office with the therapist, and consented to treatment. Savannha reviewed the events of the past week.   Patient is concerned about a dinner with her son in law and his girlfriend. Patient  reported her husband's [place] service and the impact that has had on him. Continued to discuss family dynamics and the impact they have on patients anxiety.   Interventions: Cognitive Behavioral Therapy  Diagnosis:  Anxiety 41.9  Psychiatric Treatment: No   Treatment Plan:  Client Abilities/Strengths Grasiela is bright, eloquent, and motivated to make changes.    Support System: Friends  Hospital doctor Preferences OPT  Client Statement of Needs Ludene would like to decrease her worrying behavior and to stop trying to control what is out of her hands.    Treatment Level Weekly  Symptoms  Anxiety (Status: maintained)    Goals:   Amrita experiences symptoms of anxiety.   Target Date: 03/17/25 Frequency: Weekly   Progress: 0 Modality: individual    Therapist will provide referrals for additional resources as appropriate.  Therapist will provide psycho-education regarding Koa's diagnosis and corresponding treatment approaches and interventions. [name] will support the patient's ability to achieve the goals identified. will employ CBT, BA, Problem-solving, Solution Focused, Mindfulness,  coping skills, & other evidenced-based practices will be used to promote progress towards healthy functioning to help manage decrease symptoms associated with their diagnosis.   Reduce overall level, frequency, and intensity of the feelings of depression, anxiety and panic evidenced by decreased overall symptoms from 6 to 7 days/week to 0 to 1 days/week per client report for at least 3 consecutive months. Verbally express understanding of the relationship between feelings of anxiety and their impact on thinking patterns and behaviors. Verbalize an understanding of the role that distorted thinking plays in creating fears, excessive worry, and ruminations.  [name] participated in the creation of the treatment plan)   [name]

## 2024-05-05 ENCOUNTER — Ambulatory Visit (INDEPENDENT_AMBULATORY_CARE_PROVIDER_SITE_OTHER): Admitting: Psychology

## 2024-05-05 DIAGNOSIS — F419 Anxiety disorder, unspecified: Secondary | ICD-10-CM | POA: Diagnosis not present

## 2024-05-05 NOTE — Progress Notes (Unsigned)
 [facility] Counselor/Therapist Progress Note  Patient ID: Samantha Shepard, MRN: [id number]    Date: 05/05/24  Time Spent: 10:00 am - 10:55 am  : 55 Minutes  Treatment Type: Individual Therapy.  Reported Symptoms: Anxiety  Mental Status Exam: Appearance:   Appropriate  Behavior: Appropriate  Motor: Normal  Speech/Language:  Normal Rate  Affect: Appropriate  Mood: normal  Thought process: normal  Thought content:   WNL  Sensory/Perceptual disturbances:   WNL  Orientation: oriented to person, place, and time/date  Attention: Good  Concentration: Good  Memory: WNL  Fund of knowledge:  Good  Insight:   Good  Judgment:  Good  Impulse Control: Good   Risk Assessment: Danger to Self:  No Self-injurious Behavior: No Danger to Others: No Duty to Warn:no Physical Aggression / Violence:No  Access to Firearms a concern: No  Gang Involvement:No   Subjective:   Donnell Beauchamp Miller participated in the session, in person in the office with the therapist, and consented to treatment. Lun reviewed the events of the past week and reported how her anxiousness is based on the family dynamics, some of which are listed below.   Garrel will not lie to Avelina Flores will not lie to NW ?  Garrel doesn't like pt to be mad at him (I.e. smoking pot) and will offer to buy her something--manipulation? Ben doesn't like NW to be mad at him (for any reason) and will offer to do whatever it takes to placate NW--manipulation?  Flores chooses not to be motivated about anything Garrel chooses not to be motivated about anything to do with Flores changing  We discussed the importance of recognizing that family dynamics can drive individual/group choices.   Interventions: Cognitive Behavioral Therapy  Diagnosis:   Anxiety [F41.9]   Psychiatric Treatment: No ,  Treatment Plan:  Client Abilities/Strengths Etha is bright, eloquent, and motivated to make changes.    Support System: Friends  Hospital doctor Preferences OPT  Client Statement of Needs Skarlett would like to  decrease her worrying behavior and to stop trying to control what is out of her hands.       Treatment Level Weekly  Symptoms  Anxiety (Status:maintained)  Goals:   Elzora experiences symptoms of anxiety.   Target Date: 03/17/25 Frequency: Weekly  Progress: 0 Modality: individual    Therapist will provide referrals for additional resources as appropriate.  Therapist will provide psycho-education regarding Imagene's diagnosis and corresponding treatment approaches and interventions. [name] will support the patient's ability to achieve the goals identified. will employ CBT, BA, Problem-solving, Solution Focused, Mindfulness,  coping skills, & other evidenced-based practices will be used to promote progress towards healthy functioning to help manage decrease symptoms associated with their diagnosis.   Reduce overall level, frequency, and intensity of the feelings of depression, anxiety and panic evidenced by decreased overall symptoms from 6 to 7 days/week to 0 to 1 days/week per client report for at least 3 consecutive months. Verbally express understanding of the relationship between feelings of anxiety and their impact on thinking patterns and behaviors. Verbalize an understanding of the role that distorted thinking plays in creating fears, excessive worry, and ruminations.  [name] participated in the creation of the treatment plan)    [name]

## 2024-05-12 ENCOUNTER — Ambulatory Visit: Admitting: Psychology

## 2024-05-12 DIAGNOSIS — F419 Anxiety disorder, unspecified: Secondary | ICD-10-CM | POA: Diagnosis not present

## 2024-05-12 NOTE — Progress Notes (Unsigned)
 [facility] Counselor/Therapist Progress Note  Patient ID: Samantha Shepard, MRN: [id number]    Date: 05/12/24  Time Spent: 10:00 am  - 10:55 am: 55 minutes  Treatment Type: Individual Therapy.  Reported Symptoms: Anxiety  Mental Status Exam: Appearance:  Well Groomed     Behavior: Appropriate  Motor: Normal  Speech/Language:  Normal Rate  Affect: Appropriate  Mood: normal  Thought process: normal  Thought content:   WNL  Sensory/Perceptual disturbances:   WNL  Orientation: oriented to person, place, and time/date  Attention: Good  Concentration: Good  Memory: WNL  Fund of knowledge:  Good  Insight:   Good  Judgment:  Good  Impulse Control: Good   Risk Assessment: Danger to Self:  No Self-injurious Behavior: No Danger to Others: No Duty to Warn:no Physical Aggression / Violence:No  Access to Firearms a concern: No  Gang Involvement:No   Subjective:   Margrete Delude Tillery participated in the session, in person in the office with the therapist, and consented to treatment. Kymani reviewed the events of the past week.   Pateint reported additional family dynamics that are impacting her life, and the therapeutic techniques she is using to manage those dynamics.    Interventions: Cognitive Behavioral Therapy  Diagnosis:  Anxiety 41.9   Psychiatric Treatment: No   Treatment Plan:  Client Abiilities/Strengths  Chari is bright, eloquent, and motivated to make changes  Support System: Friends  Client Treatment Preferences OPT  Client Statement of Needs Ladavia would like to decrease her worrying behavior and to stop trying to control what is out of her hands.   Treatment Level Weekly  Symptoms  Janay experiences symptoms of Anxiety (Status:maintained)  Goals:   Target Date: 03/17/25 Frequency: Weekly  Progress: 0 Modality: individual    Therapist will provide referrals for additional resources as appropriate.   Therapist will provide psycho-education regarding Cyera's diagnosis and corresponding treatment approaches and interventions. [name] will support the patient's ability to achieve the goals identified. will employ CBT, BA, Problem-solving, Solution Focused, Mindfulness,  coping skills, & other evidenced-based practices will be used to promote progress towards healthy functioning to help manage decrease symptoms associated with their diagnosis.   Reduce overall level, frequency, and intensity of the feelings of depression, anxiety and panic evidenced by decreased overall symptoms from 6 to 7 days/week to 0 to 1 days/week per client report for at least 3 consecutive months. Verbally express understanding of the relationship between feelings of anxiety and their impact on thinking patterns and behaviors. Verbalize an understanding of the role that distorted thinking plays in creating fears, excessive worry, and ruminations.  [name] participated in the creation of the treatment plan)     [name]

## 2024-05-19 ENCOUNTER — Ambulatory Visit: Admitting: Psychology

## 2024-05-19 DIAGNOSIS — F419 Anxiety disorder, unspecified: Secondary | ICD-10-CM

## 2024-05-19 NOTE — Progress Notes (Signed)
 [facility] Counselor/Therapist Progress Note  Patient ID: Samantha Shepard, MRN: [id number]    Date: 05/19/24  Time Spent: 10:00 am -10:53 am : 53 minutes    Treatment Type: Individual Therapy.  Reported Symptoms: Anxiety  Mental Status Exam: Appearance:  Neat     Behavior: Appropriate  Motor: Normal  Speech/Language:  Normal Rate  Affect: Appropriate  Mood: normal  Thought process: normal  Thought content:   WNL  Sensory/Perceptual disturbances:   WNL  Orientation: oriented to person, place, and time/date  Attention: Good  Concentration: Good  Memory: WNL  Fund of knowledge:  Good  Insight:   Good  Judgment:  Good  Impulse Control: Good   Risk Assessment: Danger to Self:  No Self-injurious Behavior: No Danger to Others: No Duty to Warn:no Physical Aggression / Violence:No  Access to Firearms a concern: No  Gang Involvement:No   Subjective:   Samantha Shepard participated in the session, in person in the office with the therapist, and consented to treatment. Samantha Shepard reviewed the events of the past week.   Patient reported examples of how she has stopped worrying about the family dynamics between her husband and her stepson.   Interventions: Cognitive Behavioral Therapy  Diagnosis:  Anxiety 41.9   Psychiatric Treatment: No   Treatment Plan:  Client Abilities/Strengths  Samantha Shepard is bright, eloquent, and motivated to make changes.   Support System: Friends  Hospital doctor Preferences OPT  Client Statement of Needs Samantha Shepard would like to decrease her worrying behavior and to stop trying to control what is out of her hands.    Treatment Level Weekly  Symptoms  [name] experiences symptoms of Anxiety (Status:maintained)   Goals:    Target Date: 03/17/25 Frequency: Weekly  Progress: 25% Modality: individual    Therapist will provide referrals for additional resources as appropriate.  Therapist will provide  psycho-education regarding Samantha Shepard diagnosis and corresponding treatment approaches and interventions. [name] will support the patient's ability to achieve the goals identified. will employ CBT, BA, Problem-solving, Solution Focused, Mindfulness,  coping skills, & other evidenced-based practices will be used to promote progress towards healthy functioning to help manage decrease symptoms associated with their diagnosis. Overall symptoms  Reduce overall level, frequency, and intensity of the feelings of depression, anxiety and panic evidenced by decreased overall symptoms from 6 to 7 days/week to 0 to 1 days/week per client report for at least 3 consecutive months. Verbally express understanding of the relationship between feelings of anxiety and their impact on thinking patterns and behaviors. Verbalize an understanding of the role that distorted thinking plays in creating fears, excessive worry, and ruminations.  [name] participated in the creation of the treatment plan)     [name]

## 2024-05-26 ENCOUNTER — Ambulatory Visit (INDEPENDENT_AMBULATORY_CARE_PROVIDER_SITE_OTHER): Admitting: Psychology

## 2024-05-26 DIAGNOSIS — F419 Anxiety disorder, unspecified: Secondary | ICD-10-CM

## 2024-05-26 NOTE — Progress Notes (Signed)
 [facility] Counselor/Therapist Progress Note  Patient ID: Samantha Shepard, MRN: [id number]    Date: 05/26/24  Time Spent: 10;00 am - 10:53 a.m. : 53 minutes  Treatment Type: Individual Therapy.  Reported Symptoms: Anxiety  Mental Status Exam: Appearance:  Well Groomed     Behavior: Appropriate  Motor: Normal  Speech/Language:  Normal Rate  Affect: Appropriate  Mood: normal  Thought process: normal  Thought content:   WNL  Sensory/Perceptual disturbances:   WNL  Orientation: oriented to person, place, and time/date  Attention: Good  Concentration: Good  Memory: WNL  Fund of knowledge:  Good  Insight:   Good  Judgment:  Good  Impulse Control: Good   Risk Assessment: Danger to Self:  No Self-injurious Behavior: No Danger to Others: No Duty to Warn:no Physical Aggression / Violence:No  Access to Firearms a concern: No  Gang Involvement:No   Subjective:   Samantha Shepard participated in the session, in person in the office with the therapist, and consented to treatment. Samantha Shepard reviewed the events of the past week.   Reported that her 56 year old sister was having issues (perfectionism, control, neurological/early Alzheimers?) Specifically with regard to    Interventions: Cognitive Behavioral Therapy  Diagnosis:  Anxiety 41.9   Psychiatric Treatment: No   Treatment Plan:  Client Abilities/Strengths Samantha Shepard is motivated to change.   Support System: Friends  Hospital doctor Preferences OPT  Client Statement of Needs Samantha Shepard would like to stop trying to control what is out of her hands.   Treatment Level Weekly  Symptoms  Samantha Shepard experiences symptoms of Anxiety (Status:maintained).  Goals:   Target Date: 03/17/25 Frequency: Weekly  Progress: 25% Modality: individual    Therapist will provide referrals for additional resources as appropriate.  Therapist will provide psycho-education regarding Samantha Shepard's  diagnosis and corresponding treatment approaches and interventions. Samantha Shepard will support the patient's ability to achieve the goals identified. will employ CBT, BA, Problem-solving, Solution Focused, Mindfulness,  coping skills, & other evidenced-based practices will be used to promote progress towards healthy functioning to help manage decrease symptoms associated with their diagnosis.   Reduce overall level, frequency, and intensity of the feelings of depression, anxiety and panic evidenced by decreased overall symptoms from 6 to 7 days/week to 0 to 1 days/week per client report for at least 3 consecutive months Verbally express understanding of the relationship between feelings of anxiety and their impact on thinking patterns and behaviors. Verbalize an understanding of the role that distorted thinking plays in creating fears, excessive worry, and ruminations.  [name] participated in the creation of the treatment plan)    Samantha Shepard

## 2024-06-02 ENCOUNTER — Ambulatory Visit: Admitting: Psychology

## 2024-06-09 ENCOUNTER — Ambulatory Visit: Admitting: Psychology

## 2024-06-16 ENCOUNTER — Ambulatory Visit (INDEPENDENT_AMBULATORY_CARE_PROVIDER_SITE_OTHER): Admitting: Psychology

## 2024-06-16 DIAGNOSIS — F419 Anxiety disorder, unspecified: Secondary | ICD-10-CM

## 2024-06-16 NOTE — Progress Notes (Unsigned)
 [facility] Counselor/Therapist Progress Note  Patient ID: Samantha Shepard, MRN: [id number]    Date: 06/16/24  Time Spent: 10:00 am - 10:57 am : 57 minutes  Treatment Type: Individual Therapy.  Reported Symptoms: Anxiety  Mental Status Exam: Appearance:  Well Groomed     Behavior: Appropriate  Motor: Normal  Speech/Language:  Normal Rate  Affect: Appropriate  Mood: normal  Thought process: normal  Thought content:   WNL  Sensory/Perceptual disturbances:   WNL  Orientation: oriented to person, place, and time/date  Attention: Good  Concentration: Good  Memory: WNL  Fund of knowledge:  Good  Insight:   Good  Judgment:  Good  Impulse Control: Good   Risk Assessment: Danger to Self:  No Self-injurious Behavior: No Danger to Others: No Duty to Warn:no Physical Aggression / Violence:No  Access to Firearms a concern: No  Gang Involvement:No   Subjective:   Samantha Shepard participated in the session, in person in the office with the therapist, and consented to treatment [name] reviewed the events of the past week.  Patient continued to be concerned about family dynamics, and has made some progress letting go of what she can't control.   Interventions: CBT  Diagnosis:  Anxiety 41.9  Psychiatric Treatment: No   Treatment Plan:  Client Abilities/Strengths Samantha Shepard is motivated to change  Support System: Friends  Hospital doctor Preferences OPT  Client Statement of Needs Samantha Shepard would like to stop trying to control what is out of her hands   Treatment Level Weekly  Symptoms  Samantha Shepard experiences symptoms of anxiety (Status: improved)  Goals:   Target Date: 03/17/25 Frequency: Weekly  Progress: 25% Modality: individual    Therapist will provide referrals for additional resources as appropriate.  Therapist will provide psycho-education regarding Samantha Shepard's diagnosis and corresponding treatment approaches and  interventions. [name] will support the patient's ability to achieve the goals identified. will employ CBT, BA, Problem-solving, Solution Focused, Mindfulness,  coping skills, & other evidenced-based practices will be used to promote progress towards healthy functioning to help manage decrease symptoms associated with their diagnosis.   Reduce overall level, frequency, and intensity of the feelings of depression, anxiety and panic evidenced by decreased overall symptoms from 6 to 7 days/week to 0 to 1 days/week per client report for at least 3 consecutive months. Verbally express understanding of the relationship between feelings of anxiety and their impact on thinking patterns and behaviors. Verbalize an understanding of the role that distorted thinking plays in creating fears, excessive worry, and ruminations.  Samantha Shepard participated in the creation of the treatment plan)    [name]

## 2024-06-20 ENCOUNTER — Other Ambulatory Visit: Payer: Self-pay | Admitting: Family Medicine

## 2024-06-23 ENCOUNTER — Ambulatory Visit: Admitting: Psychology

## 2024-06-23 DIAGNOSIS — F419 Anxiety disorder, unspecified: Secondary | ICD-10-CM

## 2024-06-23 NOTE — Progress Notes (Signed)
 [facility] Counselor/Therapist Progress Note  Patient ID: Samantha Shepard, MRN: [id number]    Date: 06/23/24  Time Spent: 10:00 am - 10:53 am: 53 minutes    Treatment Type: Individual Therapy.  Reported Symptoms: Anxiety  Mental Status Exam: Appearance:  Well Groomed     Behavior: Appropriate  Motor: Normal  Speech/Language:  Normal Rate  Affect: Appropriate  Mood: normal  Thought process: normal  Thought content:   WNL  Sensory/Perceptual disturbances:   WNL  Orientation: oriented to person, place, and time/date  Attention: Good  Concentration: Good  Memory: WNL  Fund of knowledge:  Good  Insight:   Good  Judgment:  Good  Impulse Control: Good   Risk Assessment: Danger to Self:  No Self-injurious Behavior: No Danger to Others: No Duty to Warn:no Physical Aggression / Violence:No  Access to Firearms a concern: No  Gang Involvement:No   Subjective:   Samantha Shepard participated in the session, in person in the office with the therapist, and consented to treatment. Samantha Shepard reviewed the events of the past week.   Patient reported a driving scare due to a mouse being in her car. Patient was less anxious, but still worried about her family members due to all of the family drama that takes place each week. Patient discussed how empathetic she is, and that empathy helped patient to make some important decisions. Patient's son in law, husband, will be cleaning up son in law's house. Patient talked about her future role in dealing with family drama. Patient expressed helpful insight regarding alcoholism in people she cares about.   Interventions: Cognitive Behavioral Therapy  Diagnosis:  Anxiety [F41.9]   Psychiatric Treatment: No   Treatment Plan:  Client Abilities/Strengths Samantha Shepard is motivated to change.  Support System: Friends  Hospital doctor Preferences OPT  Client Statement of Needs Samantha Shepard would like to stop trying  to control what is out of her hands   Treatment Level Weekly  Symptoms  Samantha Shepard experiences symptoms of anxiety (Status: maintained)  Goals:   Target Date: 03/17/25 Frequency: Weekly  Progress: 15% Modality: individual    Therapist will provide referrals for additional resources as appropriate.  Therapist will provide psycho-education regarding [name]'s diagnosis and corresponding treatment approaches and interventions. Samantha Shepard will support the patient's ability to achieve the goals identified. will employ CBT, BA, Problem-solving, Solution Focused, Mindfulness,  coping skills, & other evidenced-based practices will be used to promote progress towards healthy functioning to help manage decrease symptoms associated with their diagnosis.   Reduce overall level, frequency, and intensity of the feelings of depression, anxiety and panic evidenced by decreased overall symptoms from 6 to 7 days/week to 0 to 1 days/week per client report for at least 3 consecutive months. Verbally express understanding of the relationship between feelings of anxiety and their impact on thinking patterns and behaviors. Verbalize an understanding of the role that distorted thinking plays in creating fears, excessive worry, and ruminations.  Samantha Shepard participated in the creation of the treatment plan)    Samantha Shepard

## 2024-06-29 ENCOUNTER — Encounter: Payer: Self-pay | Admitting: Psychology

## 2024-06-30 ENCOUNTER — Ambulatory Visit (INDEPENDENT_AMBULATORY_CARE_PROVIDER_SITE_OTHER): Admitting: Psychology

## 2024-06-30 DIAGNOSIS — F419 Anxiety disorder, unspecified: Secondary | ICD-10-CM

## 2024-06-30 NOTE — Progress Notes (Signed)
 [facility] Counselor/Therapist Progress Note  Patient ID: Samantha Shepard, MRN: [id number]    Date: 06/30/24  Time Spent: 10:00 am - 10:53 am : 53 Minutes  Treatment Type: Individual Therapy.  Reported Symptoms: Anxiety  Mental Status Exam: Appearance:  Well Groomed     Behavior: Appropriate  Motor: Normal  Speech/Language:  Clear and Coherent  Affect: Appropriate  Mood: normal  Thought process: normal  Thought content:   WNL  Sensory/Perceptual disturbances:   WNL  Orientation: oriented to person, place, and time/date  Attention: Good  Concentration: Good  Memory: WNL  Fund of knowledge:  Good  Insight:   Good  Judgment:  Good  Impulse Control: Good   Risk Assessment: Danger to Self:  No Self-injurious Behavior: No Danger to Others: No Duty to Warn:no Physical Aggression / Violence:No  Access to Firearms a concern: No  Gang Involvement:No   Subjective:   Samantha Shepard participated in the session, in person in the office with the therapist, and consented to treatment. Samantha Shepard reviewed the events of the past week.   Patient reported feeling anxious about the political climate, turning 68 years old this weekend, and making preparations for future family burials.   Interventions: Cognitive Behavioral Therapy  Diagnosis:  Anxiety  Psychiatric Treatment: No   Treatment Plan:  Client Abilities/Strengths Samantha Shepard is motivated to change.   Support System: Friends  Hospital doctor Preferences OPT  Client Statement of Needs Samantha Shepard would like to control what is out of her hands.    Treatment Level Weekly  Symptoms  Samantha Shepard experiences symptoms of Anxiety.  Goals:   Target Date: 03/17/25 Frequency: Weekly  Progress: 15% Modality: individual    Therapist will provide referrals for additional resources as appropriate.  Therapist will provide psycho-education regarding Samantha Shepard's diagnosis and corresponding  treatment approaches and interventions. Samantha Shepard Samantha Shepard Nicolas will support the patient's ability to achieve the goals identified. will employ CBT, BA, Problem-solving, Solution Focused, Mindfulness,  coping skills, & other evidenced-based practices will be used to promote progress towards healthy functioning to help manage decrease symptoms associated with their diagnosis.   Reduce overall level, frequency, and intensity of the feelings of depression, anxiety and panic evidenced by decreased overall symptoms from 6 to 7 days/week to 0 to 1 days/week per client report for at least 3 consecutive months. Verbally express understanding of the relationship between feelings of anxiety and their impact on thinking patterns and behaviors. Verbalize an understanding of the role that distorted thinking plays in creating fears, excessive worry, and ruminations.  [name] participated in the creation of the treatment plan)    Samantha Shepard Samantha Shepard Nicolas

## 2024-07-05 ENCOUNTER — Encounter: Payer: Self-pay | Admitting: Psychology

## 2024-07-07 ENCOUNTER — Ambulatory Visit (INDEPENDENT_AMBULATORY_CARE_PROVIDER_SITE_OTHER): Admitting: Psychology

## 2024-07-07 DIAGNOSIS — F419 Anxiety disorder, unspecified: Secondary | ICD-10-CM

## 2024-07-07 NOTE — Progress Notes (Addendum)
 [facility] Counselor/Therapist Progress Note  Patient ID: Samantha Shepard, MRN: [id number]    Date: 07/07/24  Time Spent: 10:00 am - 10:30 am : 30 minutes  Treatment Type: Individual Therapy.  Reported Symptoms: Anxiety  Mental Status Exam: Appearance:  Well Groomed     Behavior: Appropriate  Motor: Normal  Speech/Language:  Normal Rate  Affect: Anxious  Mood: normal  Thought process: normal  Thought content:   WNL  Sensory/Perceptual disturbances:   WNL  Orientation: oriented to person, place, and time/date  Attention: Good  Concentration: Good  Memory: WNL  Fund of knowledge:  Good  Insight:   Good  Judgment:  Good  Impulse Control: Good   Risk Assessment: Danger to Self:  No Self-injurious Behavior: No Danger to Others: No Duty to Warn:no Physical Aggression / Violence:No  Access to Firearms a concern: No  Gang Involvement:No   Subjective:   Larenda Reedy Thornhill participated in the session, in person in the office with the therapist, and consented to treatment. Cacie reviewed the events of the past week.   Patient was pleased with how she has felt since she turned 68 years old, while expressing concern about what her future will hold for her. Patient will continue to practice anxiety reduction techniques.   Interventions: Cognitive Behavioral Therapy  Diagnosis:  Anxiety 41.9  Psychiatric Treatment: No   Treatment Plan:  Client Abilities/Strengths Lorra is motivated to change.   Support System: Friends  Hospital doctor Preferences OPT  Client Statement of Needs Emiliya would like to control what is out of her hands.    Treatment Level Weekly  Symptoms  Anxiety (Status : maintained)  Goals:   Mayzie experiences symptoms of anxiety.    Target Date: 03/17/25 Frequency: Weekly  Progress: 25% Modality: individual    Therapist will provide referrals for additional resources as appropriate.  Therapist  will provide psycho-education regarding Yakira's diagnosis and corresponding treatment approaches and interventions. [name] will support the patient's ability to achieve the goals identified. will employ CBT, BA, Problem-solving, Solution Focused, Mindfulness,  coping skills, & other evidenced-based practices will be used to promote progress towards healthy functioning to help manage decrease symptoms associated with their diagnosis.   Reduce overall level, frequency, and intensity of the feelings of depression, anxiety and panic evidenced by decreased overall symptoms from 6 to 7 days/week to 0 to 1 days/week per client report for at least 3 consecutive months. Verbally express understanding of the relationship between feelings ofanxiety and their impact on thinking patterns and behaviors. Verbalize an understanding of the role that distorted thinking plays in creating fears, excessive worry, and ruminations.  [name] participated in the creation of the treatment plan)    [name]

## 2024-07-14 ENCOUNTER — Ambulatory Visit: Admitting: Psychology

## 2024-07-16 DIAGNOSIS — Z01419 Encounter for gynecological examination (general) (routine) without abnormal findings: Secondary | ICD-10-CM | POA: Diagnosis not present

## 2024-07-21 ENCOUNTER — Ambulatory Visit: Admitting: Psychology

## 2024-07-28 ENCOUNTER — Ambulatory Visit: Admitting: Psychology

## 2024-08-03 ENCOUNTER — Encounter: Payer: Self-pay | Admitting: Internal Medicine

## 2024-08-03 NOTE — Progress Notes (Unsigned)
 Subjective:    Patient ID: Samantha Shepard, female    DOB: 1956/09/20, 68 y.o.   MRN: [id number]      HPI Norman is here for a Physical exam and her chronic medical problems.    Back pain - pain across the lower back and right side back pain.  The pain is chronic but it has gotten worse.  No leg pain.  Occ left foot tingles which is chronic.  She is not working out has much as she should, but still doing 2-3 times a week..     Medications and allergies reviewed with patient and updated if appropriate.  Current Outpatient Medications on File Prior to Visit  Medication Sig Dispense Refill   albuterol  (VENTOLIN  HFA) 108 (90 Base) MCG/ACT inhaler Inhale 2 puffs into the lungs every 4 (four) hours as needed for wheezing or shortness of breath. 18 g 1   BREO ELLIPTA  200-25 MCG/ACT AEPB Inhale 1 puff into the lungs daily. 60 each 5   clonazePAM  (KLONOPIN ) 0.5 MG tablet TAKE 1 TABLET BY MOUTH AT BEDTIME AS NEEDED FOR ANXIETY OR TMJ 30 tablet 2   estradiol-norethindrone (ACTIVELLA) 1-0.5 MG tablet Take by mouth.     famotidine  (PEPCID ) 20 MG tablet TAKE 1 TABLET BY MOUTH TWICE A DAY 180 tablet 0   fluticasone  (FLONASE ) 50 MCG/ACT nasal spray PLACE 2 SPRAYS INTO BOTH NOSTRILS DAILY AS NEEDED FOR ALLERGIES OR RHINITIS. 48 mL 1   gabapentin  (NEURONTIN ) 100 MG capsule TAKE 1-3 CAPSULES BY MOUTH AT BEDTIME 60 capsule 3   ipratropium (ATROVENT ) 0.06 % nasal spray Place 2 sprays into both nostrils 3 (three) times daily. 15 mL 5   levocetirizine (XYZAL ) 5 MG tablet Take 1 tablet once a day as needed for runny nose/itching. 30 tablet 5   losartan -hydrochlorothiazide (HYZAAR) 100-25 MG tablet TAKE 1 TABLET BY MOUTH DAILY 90 tablet 3   montelukast  (SINGULAIR ) 10 MG tablet TAKE 1 TABLET BY MOUTH EVERYDAY AT BEDTIME 90 tablet 0   Olopatadine-Mometasone  (RYALTRIS ) 665-25 MCG/ACT SUSP Place 2 sprays into the nose 2 (two) times daily. 29 g 5   No current facility-administered medications on  file prior to visit.    Review of Systems  Constitutional:  Negative for fever.  HENT:  Positive for postnasal drip.   Eyes:  Negative for visual disturbance.  Respiratory:  Positive for cough (asthma) and chest tightness (asthma). Negative for shortness of breath and wheezing.   Cardiovascular:  Negative for chest pain, palpitations and leg swelling.  Gastrointestinal:  Negative for abdominal pain, blood in stool, constipation and diarrhea.        Thalia a few times a week  Genitourinary:  Negative for dysuria.  Musculoskeletal:  Positive for back pain (chronic- has gotten worse). Negative for arthralgias.  Skin:  Negative for rash.  Neurological:  Positive for headaches (occ). Negative for light-headedness.  Psychiatric/Behavioral:  Positive for sleep disturbance. Negative for dysphoric mood. The patient is nervous/anxious.        Objective:   Vitals:   08/04/24 0956  BP: 126/78  Pulse: 64  Temp: 97.8 F (36.6 C)  SpO2: 98%   Filed Weights   08/04/24 0956  Weight: 155 lb (70.3 kg)   Body mass index is 28.35 kg/m.  BP Readings from Last 3 Encounters:  08/04/24 126/78  02/04/24 132/70  12/10/23 120/70    Wt Readings from Last 3 Encounters:  08/04/24 155 lb (70.3 kg)  12/18/22 155 lb (70.3 kg)  10/18/22  154 lb 1.6 oz (69.9 kg)       Physical Exam Constitutional: She appears well-developed and well-nourished. No distress.  HENT:  Head: Normocephalic and atraumatic.  Right Ear: External ear normal. Normal ear canal and TM Left Ear: External ear normal.  Normal ear canal and TM Mouth/Throat: Oropharynx is clear and moist.  Eyes: Conjunctivae normal.  Neck: Neck supple. No tracheal deviation present. No thyromegaly present.  No carotid bruit  Cardiovascular: Normal rate, regular rhythm and normal heart sounds.   No murmur heard.  No edema. Pulmonary/Chest: Effort normal and breath sounds normal. No respiratory distress. She has no wheezes. She has no rales.   Breast: deferred   Abdominal: Soft. She exhibits no distension. There is no tenderness.  Lymphadenopathy: She has no cervical adenopathy.  Skin: Skin is warm and dry. She is not diaphoretic.  Psychiatric: She has a normal mood and affect. Her behavior is normal.     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 Goes to gym 2-3 times a week Weight  overweight Substance abuse  none   Reviewed recommended immunizations.   Health Maintenance  Topic Date Due   Medicare Annual Wellness (AWV)  Never done   Pneumococcal Vaccine: 50+ Years (1 of 2 - PCV) Never done   Zoster Vaccines- Shingrix (1 of 2) Never done   COVID-19 Vaccine (4 - 2025-26 season) 06/08/2024   DTaP/Tdap/Td (1 - Tdap) 08/05/2024 (Originally 06/27/1975)   Influenza Vaccine  01/05/2025 (Originally 05/08/2024)   Fecal DNA (Cologuard)  08/16/2024   Mammogram  12/30/2025   DEXA SCAN  02/03/2026   Hepatitis C Screening  Completed   Meningococcal B Vaccine  Aged Out   Colonoscopy  Discontinued          See Problem List for Assessment and Plan of chronic medical problems.

## 2024-08-03 NOTE — Patient Instructions (Addendum)
 Blood work was ordered.   Xrays of your back are ordered.    Medications changes include :   increase pepcid  to 40 mg twice daily   A referral was ordered for physical therapy.      Return in about 6 months (around 02/02/2025) for follow up.    Health Maintenance, Female Adopting a healthy lifestyle and getting preventive care are important in promoting health and wellness. Ask your health care provider about: The right schedule for you to have regular tests and exams. Things you can do on your own to prevent diseases and keep yourself healthy. What should I know about diet, weight, and exercise? Eat a healthy diet  Eat a diet that includes plenty of vegetables, fruits, low-fat dairy products, and lean protein. Do not eat a lot of foods that are high in solid fats, added sugars, or sodium. Maintain a healthy weight Body mass index (BMI) is used to identify weight problems. It estimates body fat based on height and weight. Your health care provider can help determine your BMI and help you achieve or maintain a healthy weight. Get regular exercise Get regular exercise. This is one of the most important things you can do for your health. Most adults should: Exercise for at least 150 minutes each week. The exercise should increase your heart rate and make you sweat (moderate-intensity exercise). Do strengthening exercises at least twice a week. This is in addition to the moderate-intensity exercise. Spend less time sitting. Even light physical activity can be beneficial. Watch cholesterol and blood lipids Have your blood tested for lipids and cholesterol at 70 years of age, then have this test every 5 years. Have your cholesterol levels checked more often if: Your lipid or cholesterol levels are high. You are older than 70 years of age. You are at high risk for heart disease. What should I know about cancer screening? Depending on your health history and family history, you  may need to have cancer screening at various ages. This may include screening for: Breast cancer. Cervical cancer. Colorectal cancer. Skin cancer. Lung cancer. What should I know about heart disease, diabetes, and high blood pressure? Blood pressure and heart disease High blood pressure causes heart disease and increases the risk of stroke. This is more likely to develop in people who have high blood pressure readings or are overweight. Have your blood pressure checked: Every 3-5 years if you are 1-49 years of age. Every year if you are 57 years old or older. Diabetes Have regular diabetes screenings. This checks your fasting blood sugar level. Have the screening done: Once every three years after age 44 if you are at a normal weight and have a low risk for diabetes. More often and at a younger age if you are overweight or have a high risk for diabetes. What should I know about preventing infection? Hepatitis B If you have a higher risk for hepatitis B, you should be screened for this virus. Talk with your health care provider to find out if you are at risk for hepatitis B infection. Hepatitis C Testing is recommended for: Everyone born from [birth year] through 1967. Anyone with known risk factors for hepatitis C. Sexually transmitted infections (STIs) Get screened for STIs, including gonorrhea and chlamydia, if: You are sexually active and are younger than 70 years of age. You are older than 70 years of age and your health care provider tells you that you are at risk for this type  of infection. Your sexual activity has changed since you were last screened, and you are at increased risk for chlamydia or gonorrhea. Ask your health care provider if you are at risk. Ask your health care provider about whether you are at high risk for HIV. Your health care provider may recommend a prescription medicine to help prevent HIV infection. If you choose to take medicine to prevent HIV, you should first  get tested for HIV. You should then be tested every 3 months for as long as you are taking the medicine. Pregnancy If you are about to stop having your period (premenopausal) and you may become pregnant, seek counseling before you get pregnant. Take 400 to 800 micrograms (mcg) of folic acid every day if you become pregnant. Ask for birth control (contraception) if you want to prevent pregnancy. Osteoporosis and menopause Osteoporosis is a disease in which the bones lose minerals and strength with aging. This can result in bone fractures. If you are 85 years old or older, or if you are at risk for osteoporosis and fractures, ask your health care provider if you should: Be screened for bone loss. Take a calcium or vitamin D supplement to lower your risk of fractures. Be given hormone replacement therapy (HRT) to treat symptoms of menopause. Follow these instructions at home: Alcohol use Do not drink alcohol if: Your health care provider tells you not to drink. You are pregnant, may be pregnant, or are planning to become pregnant. If you drink alcohol: Limit how much you have to: 0-1 drink a day. Know how much alcohol is in your drink. In the [place] one drink equals one 12 oz bottle of beer (355 mL), one 5 oz glass of wine (148 mL), or one 1 oz glass of hard liquor (44 mL). Lifestyle Do not use any products that contain nicotine or tobacco. These products include cigarettes, chewing tobacco, and vaping devices, such as e-cigarettes. If you need help quitting, ask your health care provider. Do not use street drugs. Do not share needles. Ask your health care provider for help if you need support or information about quitting drugs. General instructions Schedule regular health, dental, and eye exams. Stay current with your vaccines. Tell your health care provider if: You often feel depressed. You have ever been abused or do not feel safe at home. Summary Adopting a healthy lifestyle and  getting preventive care are important in promoting health and wellness. Follow your health care provider's instructions about healthy diet, exercising, and getting tested or screened for diseases. Follow your health care provider's instructions on monitoring your cholesterol and blood pressure. This information is not intended to replace advice given to you by your health care provider. Make sure you discuss any questions you have with your health care provider. Document Revised: 02/13/2021 Document Reviewed: 02/13/2021 Elsevier Patient Education  2024 [employer].

## 2024-08-04 ENCOUNTER — Ambulatory Visit

## 2024-08-04 ENCOUNTER — Encounter: Payer: Self-pay | Admitting: Internal Medicine

## 2024-08-04 ENCOUNTER — Ambulatory Visit: Admitting: Internal Medicine

## 2024-08-04 VITALS — BP 126/78 | HR 64 | Temp 97.8°F | Ht 62.0 in | Wt 155.0 lb

## 2024-08-04 DIAGNOSIS — M47814 Spondylosis without myelopathy or radiculopathy, thoracic region: Secondary | ICD-10-CM | POA: Diagnosis not present

## 2024-08-04 DIAGNOSIS — N182 Chronic kidney disease, stage 2 (mild): Secondary | ICD-10-CM | POA: Diagnosis not present

## 2024-08-04 DIAGNOSIS — E7849 Other hyperlipidemia: Secondary | ICD-10-CM

## 2024-08-04 DIAGNOSIS — Z0000 Encounter for general adult medical examination without abnormal findings: Secondary | ICD-10-CM | POA: Diagnosis not present

## 2024-08-04 DIAGNOSIS — G8929 Other chronic pain: Secondary | ICD-10-CM

## 2024-08-04 DIAGNOSIS — K219 Gastro-esophageal reflux disease without esophagitis: Secondary | ICD-10-CM

## 2024-08-04 DIAGNOSIS — F419 Anxiety disorder, unspecified: Secondary | ICD-10-CM

## 2024-08-04 DIAGNOSIS — I10 Essential (primary) hypertension: Secondary | ICD-10-CM | POA: Diagnosis not present

## 2024-08-04 DIAGNOSIS — M545 Low back pain, unspecified: Secondary | ICD-10-CM

## 2024-08-04 DIAGNOSIS — R7303 Prediabetes: Secondary | ICD-10-CM

## 2024-08-04 DIAGNOSIS — G479 Sleep disorder, unspecified: Secondary | ICD-10-CM | POA: Diagnosis not present

## 2024-08-04 DIAGNOSIS — M5136 Other intervertebral disc degeneration, lumbar region with discogenic back pain only: Secondary | ICD-10-CM | POA: Diagnosis not present

## 2024-08-04 DIAGNOSIS — M47817 Spondylosis without myelopathy or radiculopathy, lumbosacral region: Secondary | ICD-10-CM | POA: Diagnosis not present

## 2024-08-04 DIAGNOSIS — J4530 Mild persistent asthma, uncomplicated: Secondary | ICD-10-CM

## 2024-08-04 DIAGNOSIS — M5134 Other intervertebral disc degeneration, thoracic region: Secondary | ICD-10-CM | POA: Diagnosis not present

## 2024-08-04 DIAGNOSIS — M85852 Other specified disorders of bone density and structure, left thigh: Secondary | ICD-10-CM

## 2024-08-04 DIAGNOSIS — M47816 Spondylosis without myelopathy or radiculopathy, lumbar region: Secondary | ICD-10-CM | POA: Diagnosis not present

## 2024-08-04 DIAGNOSIS — M549 Dorsalgia, unspecified: Secondary | ICD-10-CM | POA: Diagnosis not present

## 2024-08-04 DIAGNOSIS — M85851 Other specified disorders of bone density and structure, right thigh: Secondary | ICD-10-CM

## 2024-08-04 MED ORDER — FAMOTIDINE 40 MG PO TABS: 40.0000 mg | ORAL_TABLET | Freq: Two times a day (BID) | ORAL | 3 refills | Status: AC

## 2024-08-04 NOTE — Assessment & Plan Note (Signed)
 Chronic Lab Results  Component Value Date   HGBA1C 6.2 02/04/2024   Check a1c Low sugar / carb diet Stressed regular exercise

## 2024-08-04 NOTE — Assessment & Plan Note (Addendum)
 Chronic Some generalized and situational anxiety Controlled, Stable Continue clonazepam  0.5 mg at bedtime as needed Did see a therapist briefly but not covered by insurance so stopped going

## 2024-08-04 NOTE — Assessment & Plan Note (Signed)
 Chronic GERD-not ideally controlled and is likely flaring up her asthma Increase famotidine  to 40 mg twice daily Discussed that once things are better controlled most likely she will be able to decrease the Pepcid  dose

## 2024-08-04 NOTE — Assessment & Plan Note (Signed)
 Chronic Stage 2 - mildly decreased GFR-stable CMP, CBC Stressed increased water intake, avoiding NSAIDs Stressed keeping blood sugar and BP well-controlled Encouraged regular exercise, healthy diet low in sugars and sodium

## 2024-08-04 NOTE — Assessment & Plan Note (Signed)
 Chronic-getting worse Pain across the lower back and right side of her back without radiculopathy So they could be the position but she is in in bed-she will work on correcting that X-rays of lumbar and thoracic spines today Referral to physical therapy If no improvement can refer to sports medicine/orthopedics

## 2024-08-04 NOTE — Assessment & Plan Note (Addendum)
 Chronic Regular exercise and healthy diet encouraged Check lipid panel, CMP, TSH coronary calcium score of 1 Continue lifestyle control

## 2024-08-04 NOTE — Assessment & Plan Note (Addendum)
 Chronic Following with allergy Not as controlled recently, which could be related to her frequent GERD Increase famotidine  to 40 mg twice daily-discussed that eventually this should be able to be decreased again Continue Breo 1 puff once daily and albuterol  inhaler as needed Continue Singulair  10 mg nightly, nasal sprays, antihistamine

## 2024-08-04 NOTE — Assessment & Plan Note (Signed)
Chronic Blood pressure well controlled CMP Continue losartan-HCTZ 100-25 mg daily 

## 2024-08-04 NOTE — Assessment & Plan Note (Signed)
Chronic DEXA up-to-date Continue regular exercise Continue calcium and vitamin D daily Check vitamin D level

## 2024-08-04 NOTE — Assessment & Plan Note (Addendum)
 Chronic Controlled, Stable Falls asleep ok Wakes up in the middle of the night and her mid races and can not get back to sleep Continue clonazepam  0.5 mg at bedtime as needed Gabapentin  100-300 mg at bedtime prn-usually takes 200 mg at night as needed-does not take often

## 2024-08-07 ENCOUNTER — Ambulatory Visit: Payer: Self-pay | Admitting: Internal Medicine

## 2024-08-08 ENCOUNTER — Other Ambulatory Visit: Payer: Self-pay | Admitting: Family Medicine

## 2024-08-11 ENCOUNTER — Ambulatory Visit: Admitting: Psychology

## 2024-08-11 ENCOUNTER — Other Ambulatory Visit (INDEPENDENT_AMBULATORY_CARE_PROVIDER_SITE_OTHER)

## 2024-08-11 DIAGNOSIS — I10 Essential (primary) hypertension: Secondary | ICD-10-CM

## 2024-08-11 DIAGNOSIS — N182 Chronic kidney disease, stage 2 (mild): Secondary | ICD-10-CM

## 2024-08-11 DIAGNOSIS — M85851 Other specified disorders of bone density and structure, right thigh: Secondary | ICD-10-CM

## 2024-08-11 DIAGNOSIS — M85852 Other specified disorders of bone density and structure, left thigh: Secondary | ICD-10-CM

## 2024-08-11 DIAGNOSIS — R7303 Prediabetes: Secondary | ICD-10-CM | POA: Diagnosis not present

## 2024-08-11 DIAGNOSIS — E7849 Other hyperlipidemia: Secondary | ICD-10-CM | POA: Diagnosis not present

## 2024-08-11 LAB — COMPREHENSIVE METABOLIC PANEL WITH GFR
ALT: 23 U/L (ref 0–35)
AST: 20 U/L (ref 0–37)
Albumin: 4.5 g/dL (ref 3.5–5.2)
Alkaline Phosphatase: 59 U/L (ref 39–117)
BUN: 21 mg/dL (ref 6–23)
CO2: 25 meq/L (ref 19–32)
Calcium: 10.4 mg/dL (ref 8.4–10.5)
Chloride: 102 meq/L (ref 96–112)
Creatinine, Ser: 0.95 mg/dL (ref 0.40–1.20)
GFR: 60.87 mL/min (ref >60.00)
Glucose, Bld: 89 mg/dL (ref 70–99)
Potassium: 4 meq/L (ref 3.5–5.1)
Sodium: 138 meq/L (ref 135–145)
Total Bilirubin: 0.7 mg/dL (ref 0.2–1.2)
Total Protein: 7.8 g/dL (ref 6.0–8.3)

## 2024-08-11 LAB — CBC
HCT: 42.5 % (ref 36.0–46.0)
Hemoglobin: 14.6 g/dL (ref 12.0–15.0)
MCHC: 34.4 g/dL (ref 30.0–36.0)
MCV: 98.7 fl (ref 78.0–100.0)
Platelets: 254 K/uL (ref 150.0–400.0)
RBC: 4.3 Mil/uL (ref 3.87–5.11)
RDW: 12.9 % (ref 11.5–15.5)
WBC: 6.7 K/uL (ref 4.0–10.5)

## 2024-08-11 LAB — LIPID PANEL
Cholesterol: 243 mg/dL — ABNORMAL HIGH (ref 0–200)
HDL: 55.8 mg/dL (ref >39.00)
LDL Cholesterol: 151 mg/dL — ABNORMAL HIGH (ref 0–99)
NonHDL: 187.09
Total CHOL/HDL Ratio: 4
Triglycerides: 178 mg/dL — ABNORMAL HIGH (ref 0.0–149.0)
VLDL: 35.6 mg/dL (ref 0.0–40.0)

## 2024-08-11 LAB — HEMOGLOBIN A1C: Hgb A1c MFr Bld: 6.1 % (ref 4.6–6.5)

## 2024-08-12 LAB — VITAMIN D 25 HYDROXY (VIT D DEFICIENCY, FRACTURES): VITD: 35.01 ng/mL (ref 30.00–100.00)

## 2024-08-12 LAB — TSH: TSH: 0.96 u[IU]/mL (ref 0.35–5.50)

## 2024-08-18 ENCOUNTER — Ambulatory Visit: Admitting: Psychology

## 2024-08-18 NOTE — Therapy (Signed)
 OUTPATIENT PHYSICAL THERAPY THORACOLUMBAR EVALUATION   Patient Name: Samantha Shepard MRN: [id number] DOB:07-Apr-1956, 68 y.o., female Today's Date: 08/18/2024  END OF SESSION:   Past Medical History:  Diagnosis Date   Allergy    dust, cats   Anxiety    Asthma    controlled   Hyperlipidemia    Hypertension    Left breast mass    Migraines    Past Surgical History:  Procedure Laterality Date   BREAST BIOPSY  10/17/2022   MM RT RADIOACTIVE SEED LOC MAMMO GUIDE 10/17/2022 [facility] MAMMOGRAPHY   BREAST EXCISIONAL BIOPSY Left 06/2019   benign   BREAST LUMPECTOMY WITH RADIOACTIVE SEED LOCALIZATION Left 07/08/2019   Procedure: LEFT BREAST LUMPECTOMY WITH RADIOACTIVE SEED LOCALIZATION;  Surgeon: Curvin Deward MOULD, MD;  Location: [facility];  Service: General;  Laterality: Left;   BREAST LUMPECTOMY WITH RADIOACTIVE SEED LOCALIZATION Right 10/18/2022   Procedure: RIGHT BREAST LUMPECTOMY WITH RADIOACTIVE SEED LOCALIZATION;  Surgeon: Curvin Deward III, MD;  Location: [facility];  Service: General;  Laterality: Right;   COLONOSCOPY  2009   neg; [facility]   thyroid  cyst aspirated     TONSILLECTOMY     Patient Active Problem List   Diagnosis Date Noted   Chronic lower back pain 08/04/2024   CKD (chronic kidney disease) stage 2, GFR 60-89 ml/min 02/03/2024   Not well controlled moderate persistent asthma 09/18/2022   Intraductal papilloma of right breast 08/20/2022   Agatston coronary artery calcium score of 1,   03/2022 03/17/2022   Gastroesophageal reflux disease 08/31/2020   Cough 08/31/2020   Sleep difficulties 07/29/2020   Temporomandibular joint (TMJ) pain 06/16/2018   Anxiety 06/16/2018   Cervical adenopathy 06/16/2018   Jaw deformity 06/16/2018   Prediabetes 08/01/2017   Tingling of both feet 04/18/2016   Osteopenia 01/15/2016   Mild persistent asthma 01/10/2016   Other allergic rhinitis 01/10/2016   Hyperlipidemia 08/13/2008   Hypertension  09/22/2007   Allergic rhinitis 05/26/2007    PCP: Geofm Glade PARAS, MD   REFERRING PROVIDER: Geofm Glade PARAS, MD   REFERRING DIAG: M54.50,G89.29 (ICD-10-CM) - Chronic bilateral low back pain without sciatica  THERAPY DIAG:  No diagnosis found.  RATIONALE FOR EVALUATION AND TREATMENT: Rehabilitation  ONSET DATE: chronic, years ago  NEXT MD VISIT:    SUBJECTIVE:  SUBJECTIVE STATEMENT: 68 y/o patient referred to PT for chronic LBP.  States she has had pain for many years in her low back.  Denies any chiropractic or injections.   Has had xrays that showed DDD and facet OA.   She gets pain sitting upright without a back support on her chair.   She cannot tolerate sititng for more than a few minutes on the mat table today for the evaluation visit and has to sit in a chair with a back.   Rolling in bed and any twisting motions are most painful.   Ironing also increases pain.   Bending and doing laundry is ok. Very active, goes to [facility] hospital gym and has a systems analyst.   Walks 2.5 to 5 miles daily.   Does standing back rotation with cable cross over machine and it hurt today.  Does planks and can hold for 1 min with her trainer.   Lives with spouse who is 73 y/o, but she does not have to do any personal care as he is mobile and healthy.    PAIN: Are you having pain? Yes: NPRS scale: 8/10 now;  1/10 best over last week Pain location: central low back Pain description: aching, sore Aggravating factors: rolling over in bed Relieving factors: heat, not rolling in bed  PERTINENT HISTORY:  HTN, hyperlipidemia, osteopenia, prediabetes, CKD 2, R breast intraductal papilloma  PRECAUTIONS: None  RED FLAGS: None  WEIGHT BEARING RESTRICTIONS: No  FALLS:  Has patient fallen in last 6 months?  No  LIVING ENVIRONMENT: Lives with: lives with their family and lives with their spouse Lives in: House/apartment Stairs: Yes: External: 1 steps; none Has following equipment at home: None  OCCUPATION: retired surveyor, quantity  PLOF: Independent with gait  PATIENT GOALS: have less back pain and be able to move in bed without pain   OBJECTIVE: (objective measures completed at initial evaluation unless otherwise dated)  DIAGNOSTIC FINDINGS:  EXAM: 4 VIEW(S) XRAY OF THE LUMBAR SPINE 08/04/2024 10:49:47 AM   COMPARISON: None available.   CLINICAL HISTORY: chronic pain w/o radiculopathy. Back pain x several years, recently pain is worse. NKI.   FINDINGS:   LUMBAR SPINE:   BONES: No acute fracture. No aggressive appearing osseous lesion. Alignment is normal.   DISCS AND DEGENERATIVE CHANGES: Mild endplate remodeling is seen throughout the lumbar spine in keeping with changes of mild degenerative disc disease. Facet arthrosis at L5-S1 noted.   SOFT TISSUES: No acute abnormality.   IMPRESSION: 1. Mild degenerative disc disease with endplate remodeling throughout the lumbar spine. 2. Facet arthrosis at L5-S1.   Electronically signed by: Dorethia Molt MD 08/05/2024 09:39 PM EDT RP Workstation: [id number]  EXAM: 3 VIEW(S) XRAY OF THE THORACIC SPINE 08/04/2024 10:49:47 AM   COMPARISON: None available.   CLINICAL HISTORY: chronic pain w/o radiculopathy. Back pain x several years, recently pain is worse. NKI.   FINDINGS:   BONES: No acute fracture. No aggressive appearing osseous lesion. Alignment is normal.   DISCS AND DEGENERATIVE CHANGES: Mild endplate remodeling within the upper mid thoracic spine in keeping with mild degenerative disc disease.   SOFT TISSUES: The visualized lungs are clear.   IMPRESSION: 1. Mild degenerative disc disease in the mid thoracic spine.   Electronically signed by: Dorethia Molt MD 08/05/2024 09:40 PM EDT  RP Workstation: [id number]  PATIENT SURVEYS:  Modified Oswestry:  MODIFIED OSWESTRY DISABILITY SCALE  Date: 08/25/24 Score  Pain intensity 3 =  Pain medication provides me with  moderate relief from pain.  2. Personal care (washing, dressing, etc.) 1 =  I can take care of myself normally, but it increases my pain.  3. Lifting 1 = I can lift heavy weights, but it causes increased pain.  4. Walking 0 = Pain does not prevent me from walking any distance  5. Sitting 2 =  Pain prevents me from sitting more than 1 hour.  6. Standing 1 =  I can stand as long as I want but, it increases my pain.  7. Sleeping 3 =  Even when I take pain medication, I sleep less than 4 hours.  8. Social Life 0 = My social life is normal and does not increase my pain.  9. Traveling 2 =  My pain restricts my travel over 2 hours.  10. Employment/ Homemaking 2 = I can perform most of my homemaking/job duties, but pain prevents me from performing more physically stressful activities (eg, lifting, vacuuming).  Total 16/50 = 32%   Interpretation of scores: Score Category Description  0-20% Minimal Disability The patient can cope with most living activities. Usually no treatment is indicated apart from advice on lifting, sitting and exercise  21-40% Moderate Disability The patient experiences more pain and difficulty with sitting, lifting and standing. Travel and social life are more difficult and they may be disabled from work. Personal care, sexual activity and sleeping are not grossly affected, and the patient can usually be managed by conservative means  41-60% Severe Disability Pain remains the main problem in this group, but activities of daily living are affected. These patients require a detailed investigation  61-80% Crippled Back pain impinges on all aspects of the patient's life. Positive intervention is required  81-100% Bed-bound These patients are either bed-bound or exaggerating their symptoms  Bluford FORBES Zoe DELENA Karon DELENA, et al. Surgery versus conservative management of stable thoracolumbar fracture: the PRESTO feasibility RCT. [place] ([place]): [employer]; 2021 Nov. [facility] [employer] Assessment, No. 25.62.) Appendix 3, Oswestry Disability Index category descriptors. Available from: Findjewelers.cz  Minimally Clinically Important Difference (MCID) = 12.8%  SCREENING FOR RED FLAGS: Bowel or bladder incontinence: No Spinal tumors: No Cauda equina syndrome: No Compression fracture: No Abdominal aneurysm: No  COGNITION:  Overall cognitive status: Within functional limits for tasks assessed    SENSATION: WFL  POSTURE:  Supinated feet, lower R shoulder, leans a little to the R, fairly normal lordosis Supine:  R malleolus is slightly longer, R IC is slightly higher, ASIS appear equal  PALPATION: B paraspinals, L quadratus and L SI, and increased pain with   LUMBAR ROM:   Active  Eval  Flexion Mid shins, no pain  Extension 50%; tight at end range  Right lateral flexion Mid thigh  Left lateral flexion Upper thigh, pain and stiff  Right rotation 50%, stiff, some pain  Left rotation 75%, min pain  (Blank rows = not tested)  MUSCLE LENGTH: Hamstrings: Right SLR = 80 deg; Left SLR = 75 deg Thomas test: Right 0 deg; Left 0 deg Hamstrings: tight BLE ITB: Tight LLE Piriformis: mild tight BLE Hip flexors:  mild tight BLE Quads: NT Heelcord: NT  LOWER EXTREMITY ROM:     (Blank rows = not tested)  LOWER EXTREMITY MMT:   Active  Right eval Left eval  Hip flexion 5 5  Hip extension 3+ 4-  Hip abduction 5 4-  Hip adduction    Hip internal rotation 4+ 4  Hip external rotation 5 5  Knee flexion 5 4  Knee extension 5 5  Ankle dorsiflexion 5 4  Ankle plantarflexion    Ankle inversion    Ankle eversion     LUMBAR SPECIAL TESTS:  Straight leg raise test: Negative, Slump test: Negative, SI Compression/distraction test: Positive,  FABER test: Positive, Gaenslen's test: Negative, and Thomas test: Negative;  FABER is positive on LLE only  FUNCTIONAL TESTS:   Long sit test RLE goes long to short at malleoli;  Standing:  Lower R shoulder;  supinator, L IT was tight, R piriformis causes some back pain with stretch;  R anterior/ L posterior pelvic rotation is a possibility  Long leg traction BLE relieves her back pain considerably  GAIT: Distance walked: into clinic x 150' Assistive device utilized: None Level of assistance: Complete Independence Gait pattern: very hard heel striker with supinatory roll BLE R >L; short quick step gait pattern, minimal trunk rotation Comments:    TODAY'S TREATMENT:  08/25/24 SELF CARE: Provided education on PT POC progression., initial HEP  MODALITIES:   HP/premod ESTIM to B paraspinals from T8 to PSIS at machine default settings x 12-18 mA intensity x 15' in sitting (declined lying on back)   PATIENT EDUCATION:  Education details: PT eval findings, anticipated POC, and initial HEP  Person educated: Patient Education method: Explanation, Demonstration, Verbal cues, Tactile cues, Handouts, and MedBridgeGO app access provided Education comprehension: verbalized understanding, verbal cues required, tactile cues required, and needs further education  HOME EXERCISE PROGRAM: Access Code: [email or web address] URL: [email or web address] Date: 08/25/2024 Prepared by: Garnette Montclair  Exercises - Seated Hamstring Stretch  - 1 x daily - 7 x weekly - 1 sets - 2 reps - 1 min hold - Hip Flexor Stretch at Edge of Bed  - 1 x daily - 7 x weekly - 1 sets - 2 reps - 1 min hold - Supine Piriformis Stretch with Foot on Ground  - 1 x daily - 7 x weekly - 1 sets - 2 reps - 1 min hold - Sidelying Hip Abduction  - 1 x daily - 7 x weekly - 3 sets - 10 reps   ASSESSMENT:  CLINICAL IMPRESSION: Alanna Storti is a 68 y.o. female who was referred to physical therapy for evaluation and  treatment for LBP.   Patient reports onset of LBP pain beginning years ago. Pain is worse with rolling over in bed or twisting in any position.   Xrays showed DDD and facet OA.  She goes to the gym and works with a systems analyst.   She has very good leg strength with the exception of some L hip weakness.    However, she is very stiff and has some postural deficits.  L5 P/A glides are painful as well as rotational glides in each direction but particularly to the L.  Patient has deficits in lumbar ROM, lumbar/B LE flexibility, L hamstring, hip abductor, and extensor strength, abnormal posture, and TTP with abnormal muscle tension and pain which are interfering with ADLs and are impacting quality of life.  On Modified Oswestry patient scored 16/50 demonstrating 32% or moderate disability.  Kynesha will benefit from skilled PT to address above deficits to improve mobility and activity tolerance with decreased pain interference.     OBJECTIVE IMPAIRMENTS: difficulty walking, decreased ROM, decreased strength, impaired flexibility, postural dysfunction, and pain.   ACTIVITY LIMITATIONS: lifting, bending, standing, sleeping, and locomotion level  PARTICIPATION LIMITATIONS: cleaning, laundry, and community activity  PERSONAL FACTORS: Age, Time since onset of injury/illness/exacerbation,  and 1-2 comorbidities: HTN, hyperlipidemia, osteopenia, prediabetes, CKD 2, R breast intraductal papilloma are also affecting patient's functional outcome.   REHAB POTENTIAL: Good  CLINICAL DECISION MAKING: Evolving/moderate complexity  EVALUATION COMPLEXITY: Moderate   GOALS: Goals reviewed with patient? Yes  SHORT TERM GOALS: Target date: 09/22/2024   Patient will be independent with initial HEP to improve outcomes and carryover.  Baseline: 100% PT assist required for correct completion Goal status: INITIAL  2.  Patient will report 25% improvement in low back pain to improve QOL. Baseline: 8/10 worst Goal  status: INITIAL  3.  Patient will improve lumbar extension and sidebending to 75% ROM in all planes Baseline:  Goal status: INITIAL   LONG TERM GOALS: Target date: 10/20/2024   Patient will be independent with ongoing/advanced HEP for self-management at home.  Baseline: no advanced HEP yet Goal status: INITIAL  2.  Patient will report 50-75% improvement in low back pain to improve QOL.  Baseline: 8/10 Goal status: INITIAL  3.  Patient to demonstrate ability to achieve and maintain good spinal alignment/posturing and body mechanics needed for daily activities. Baseline: some R lateral lean/list Goal status: INITIAL  4.  Patient will demonstrate full pain free lumbar ROM to perform ADLs.   Baseline: Refer to above lumbar ROM table Goal status: INITIAL  5.  Patient will demonstrate improved BLE strength to >/= 5/5 for improved stability and ease of mobility. Baseline: Refer to above LE MMT table Goal status: INITIAL  6. Patient will report </= 20% on Modified Oswestry (MCID = 12%) to demonstrate improved functional ability with decreased pain interference. Baseline: 32% Goal status: INITIAL  7.  Patient will tolerate 30 min of (standing/sitting/walking) and being in any position in bed w/o increased pain to allow for  improved mobility and activity tolerance. Baseline: pain with twisting in any position, pain sitting unsupported without back support Goal status: INITIAL    PLAN:  PT FREQUENCY: 1-2x/week  PT DURATION: 8 weeks  PLANNED INTERVENTIONS: 02835- PT Re-evaluation, 97750- Physical Performance Testing, 97110-Therapeutic exercises, 97530- Therapeutic activity, [id number]- Neuromuscular re-education, 97535- Self Care, 02859- Manual therapy, [id number]- Electrical stimulation (unattended), 97035- Ultrasound, 02987- Traction (mechanical), [id number]- Ionotophoresis 4mg /ml Dexamethasone , 79439 (1-2 muscles), 20561 (3+ muscles)- Dry Needling, Patient/Family education, Taping, Joint  mobilization, Spinal mobilization, Cryotherapy, and Moist heat  PLAN FOR NEXT SESSION: See how electrical stimulation and HEP did last visit,  try pelvic traction since it gave her relief today, do a lot of stretching---add gastroc and soleus stretching, check SI/long sit and add MET pelvic rotation stretching for anterior R inominate if indicated, try long leg traction afterwards   Bryer Gottsch, PT 08/18/2024, 9:30 PM

## 2024-08-20 NOTE — Patient Instructions (Incomplete)
 Asthma- not well controlled Continue albuterol  2 puffs once every 4 hours as needed You may use albuterol  2 puffs 5-15 minutes before activity to decrease cough or wheeze Stop Breo 200 mcg Start Breztri taking 2 puffs twice a day with spacer. Rinse mouth out after.  Samples of and along with a spacer  Asthma control goals:  Full participation in all desired activities (may need albuterol  before activity) Albuterol  use two time or less a week on average (not counting use with activity) Cough interfering with sleep two time or less a month Oral steroids no more than once a year No hospitalizations   Allergic rhinitis Contiue Xyzal  (levocetirizine) 2.5-5 mg once a day as needed for runny nose/itching Continue ipratroprium nasal spray 2 sprays in each nostril twice a day as needed for a runny nose.  Try using this nasal spray more consistently to see if this helps with the drainage down your throat.  Caution as this can dry you out. Consider saline nasal rinses as needed for nasal symptoms. Use this before any medicated nasal sprays for best result Consider nasal saline gel if needed for dry nostrils Consider updating your environmental allergy testing.  Remember to stop your antihistamines for 3 days before your testing appointment  Reflux Continue dietary and lifestyle modifications  Stop famotidine  40 mg twice a day Continue omeprazole once a day I will refer you to GI due to your reflux and concerns for being on a proton pump inhibitor long term  Call the clinic if this treatment plan is not working well for you  Follow up in 4 to 6 weeks or sooner if needed.

## 2024-08-21 ENCOUNTER — Encounter: Payer: Self-pay | Admitting: Family

## 2024-08-21 ENCOUNTER — Ambulatory Visit: Admitting: Family

## 2024-08-21 VITALS — BP 128/66 | HR 72 | Temp 97.8°F | Resp 22 | Wt 156.7 lb

## 2024-08-21 DIAGNOSIS — J3089 Other allergic rhinitis: Secondary | ICD-10-CM | POA: Diagnosis not present

## 2024-08-21 DIAGNOSIS — K219 Gastro-esophageal reflux disease without esophagitis: Secondary | ICD-10-CM

## 2024-08-21 DIAGNOSIS — J4540 Moderate persistent asthma, uncomplicated: Secondary | ICD-10-CM | POA: Diagnosis not present

## 2024-08-21 MED ORDER — ALBUTEROL SULFATE (2.5 MG/3ML) 0.083% IN NEBU
2.5000 mg | INHALATION_SOLUTION | Freq: Once | RESPIRATORY_TRACT | Status: AC
  Administered 2024-08-21: 2.5 mg via RESPIRATORY_TRACT

## 2024-08-21 MED ORDER — BREZTRI AEROSPHERE 160-9-4.8 MCG/ACT IN AERO: INHALATION_SPRAY | RESPIRATORY_TRACT | 2 refills | Status: AC

## 2024-08-21 MED ORDER — ALBUTEROL SULFATE HFA 108 (90 BASE) MCG/ACT IN AERS: 2.0000 | INHALATION_SPRAY | RESPIRATORY_TRACT | 1 refills | Status: AC | PRN

## 2024-08-21 MED ORDER — LEVOCETIRIZINE DIHYDROCHLORIDE 5 MG PO TABS: ORAL_TABLET | ORAL | 5 refills | Status: AC

## 2024-08-21 NOTE — Progress Notes (Signed)
 [address] [place] [address] Dept: [phone]  FOLLOW UP NOTE  Patient ID: [facility], female    DOB: 1956-07-02  Age: 68 y.o. MRN: [id number] Date of Office Visit: 08/21/2024  Assessment  Chief Complaint: Breathing Problem, Cough, and Medication Refill  HPI Samantha Shepard is a 68 year old female who presents today for an acute visit of breathing problems.  She was last seen on December 10, 2023 and Ambs, FNP for mild persistent asthma, allergic rhinitis, and gastroesophageal reflux disease.  She denies any new diagnosis or surgery since her last office visit.  Mild persistent asthma: She reports for the past 2 months her cough and breathing have been worse.  She normally has a baseline cough.  In the morning her cough is productive and is milky white but sometimes some green in it.  The rest of the day her cough is nonproductive.  She does wake up most days with shortness of breath.  Then it is better after she exercises.  She wonders if she is anxious.  She also feels like the dryness and leaves could be causing her asthma and allergies to be worse.  Fall is typically worse for her.  She has also had some wheezing and tightness in her chest.  For the past month she has been taking Breo 200 mcg 1 puff once a day and she does not feel like it made a difference.  The albuterol  does help.  She will take 1 puff maybe 3-4 times a week.  She has been using her albuterol  more.  Since her last office visit she has not received any systemic steroids or made any trips to the emergency room or urgent care due to breathing problems.  She would like an albuterol  breathing treatment.  Allergic rhinitis: She reports rhinorrhea that is clear and postnasal drip that has been worse for the past week.  She denies nasal congestion, fever, chills, sinus pressure/sinus tenderness.  She has not been treated for any sinus infections since we last saw her.  She did forget to take her Xyzal   5 mg.  She does mention that it makes her hungry.  She has a brown bottle nasal spray that does well with the runny nose, but she does not know the name of this medication.  She does not think it is ipratropium bromide  nasal spray.  She mentions that nasal sprays dry her out.  Reflux is reported as being kind of bad lately.  She mentions approximately last week or the week before her primary care physician increased her famotidine  20 mg to 40 mg twice a day.  She cannot tell that it has helped.  She has been just taking omeprazole at night and she reports that her reflux has not been as bad in the morning.  She is having reflux symptoms almost daily while on famotidine  twice a day and belching.  Her reflux symptoms are less on the omeprazole once a day.  She has never seen a GI doctor.  1 other time she was previously on omeprazole, but had concerns about being on a proton pump inhibitor long-term.   Drug Allergies:  Allergies  Allergen Reactions   Sulfonamide Derivatives     REACTION: rash   Codeine     REACTION: Excitability--can't sleep   Augmentin  [Amoxicillin -Pot Clavulanate] Diarrhea    Review of Systems: Negative except as per HPI   Physical Exam: BP 128/66 (BP Location: Left Arm, Patient Position: Sitting, Cuff Size:  Normal)   Pulse 72   Temp 97.8 F (36.6 C) (Oral)   Resp (!) 22   Wt 156 lb 11.2 oz (71.1 kg)   SpO2 100%   BMI 28.66 kg/m    Physical Exam Constitutional:      Appearance: Normal appearance.  HENT:     Head: Normocephalic and atraumatic.     Comments: Pharynx normal, eyes normal, ears normal, nose normal    Right Ear: Tympanic membrane, ear canal and external ear normal.     Left Ear: Tympanic membrane, ear canal and external ear normal.     Nose: Nose normal.     Mouth/Throat:     Mouth: Mucous membranes are moist.     Pharynx: Oropharynx is clear.  Eyes:     Conjunctiva/sclera: Conjunctivae normal.  Cardiovascular:     Rate and Rhythm: Regular  rhythm.     Heart sounds: Normal heart sounds.  Pulmonary:     Effort: Pulmonary effort is normal.     Breath sounds: Normal breath sounds.     Comments: Lungs clear to auscultation Musculoskeletal:     Cervical back: Neck supple.  Skin:    General: Skin is warm.  Neurological:     Mental Status: She is alert and oriented to person, place, and time.  Psychiatric:        Mood and Affect: Mood normal.        Behavior: Behavior normal.        Thought Content: Thought content normal.        Judgment: Judgment normal.     Diagnostics: FVC 1.98 L (74%), FEV1 1.64 L (79%), FEV1/FVC 0.83.  Spirometry indicates normal spirometry.  Albuterol  and nebulizer treatment given.  Post bronchodilator response shows FVC 1.96 L (73%), FEV1 1.60 L (77%).  Spirometry indicates no change in FEV1 and normal spirometry  Assessment and Plan: 1. Not well controlled moderate persistent asthma   2. Gastroesophageal reflux disease, unspecified whether esophagitis present   3. Other allergic rhinitis     Meds ordered this encounter  Medications   levocetirizine (XYZAL ) 5 MG tablet    Sig: Take 1 tablet once a day as needed for runny nose/itching.    Dispense:  30 tablet    Refill:  5   albuterol  (VENTOLIN  HFA) 108 (90 Base) MCG/ACT inhaler    Sig: Inhale 2 puffs into the lungs every 4 (four) hours as needed for wheezing or shortness of breath.    Dispense:  18 g    Refill:  1   albuterol  (PROVENTIL ) (2.5 MG/3ML) 0.083% nebulizer solution 2.5 mg   budesonide-glycopyrrolate-formoterol (BREZTRI AEROSPHERE) 160-9-4.8 MCG/ACT AERO inhaler    Sig: Inhale 2 puffs twice a a day with spacer    Dispense:  10.7 g    Refill:  2    Patient Instructions  Asthma- not well controlled Continue albuterol  2 puffs once every 4 hours as needed You may use albuterol  2 puffs 5-15 minutes before activity to decrease cough or wheeze Stop Breo 200 mcg Start Breztri taking 2 puffs twice a day with spacer. Rinse mouth out  after.  Samples of and along with a spacer  Asthma control goals:  Full participation in all desired activities (may need albuterol  before activity) Albuterol  use two time or less a week on average (not counting use with activity) Cough interfering with sleep two time or less a month Oral steroids no more than once a year No hospitalizations   Allergic rhinitis Contiue  Xyzal  (levocetirizine) 2.5-5 mg once a day as needed for runny nose/itching Continue ipratroprium nasal spray 2 sprays in each nostril twice a day as needed for a runny nose.  Try using this nasal spray more consistently to see if this helps with the drainage down your throat.  Caution as this can dry you out. Consider saline nasal rinses as needed for nasal symptoms. Use this before any medicated nasal sprays for best result Consider nasal saline gel if needed for dry nostrils Consider updating your environmental allergy testing.  Remember to stop your antihistamines for 3 days before your testing appointment  Reflux Continue dietary and lifestyle modifications  Stop famotidine  40 mg twice a day Continue omeprazole once a day I will refer you to GI due to your reflux and concerns for being on a proton pump inhibitor long term  Call the clinic if this treatment plan is not working well for you  Follow up in 4 to 6 weeks or sooner if needed.  Return in about 4 weeks (around 09/18/2024), or if symptoms worsen or fail to improve.    Thank you for the opportunity to care for this patient.  Please do not hesitate to contact me with questions.  Wanda Craze, FNP [facility] 

## 2024-08-25 ENCOUNTER — Ambulatory Visit: Admitting: Psychology

## 2024-08-25 ENCOUNTER — Ambulatory Visit: Attending: Internal Medicine | Admitting: Rehabilitation

## 2024-08-25 ENCOUNTER — Other Ambulatory Visit: Payer: Self-pay

## 2024-08-25 ENCOUNTER — Encounter: Payer: Self-pay | Admitting: Rehabilitation

## 2024-08-25 DIAGNOSIS — M545 Low back pain, unspecified: Secondary | ICD-10-CM | POA: Diagnosis not present

## 2024-08-25 DIAGNOSIS — G8929 Other chronic pain: Secondary | ICD-10-CM | POA: Diagnosis not present

## 2024-08-25 DIAGNOSIS — M5459 Other low back pain: Secondary | ICD-10-CM | POA: Insufficient documentation

## 2024-08-25 DIAGNOSIS — M6281 Muscle weakness (generalized): Secondary | ICD-10-CM | POA: Diagnosis not present

## 2024-09-01 ENCOUNTER — Ambulatory Visit: Admitting: Psychology

## 2024-09-02 ENCOUNTER — Ambulatory Visit

## 2024-09-02 DIAGNOSIS — M5459 Other low back pain: Secondary | ICD-10-CM

## 2024-09-02 DIAGNOSIS — G8929 Other chronic pain: Secondary | ICD-10-CM | POA: Diagnosis not present

## 2024-09-02 DIAGNOSIS — M545 Low back pain, unspecified: Secondary | ICD-10-CM | POA: Diagnosis not present

## 2024-09-02 DIAGNOSIS — M6281 Muscle weakness (generalized): Secondary | ICD-10-CM | POA: Diagnosis not present

## 2024-09-02 NOTE — Therapy (Signed)
 OUTPATIENT PHYSICAL THERAPY THORACOLUMBAR EVALUATION   Patient Name: Samantha Shepard MRN: [id number] DOB:09/21/1956, 68 y.o., female Today's Date: 09/02/2024  END OF SESSION:   Past Medical History:  Diagnosis Date   Allergy    dust, cats   Anxiety    Asthma    controlled   Hyperlipidemia    Hypertension    Left breast mass    Migraines    Past Surgical History:  Procedure Laterality Date   BREAST BIOPSY  10/17/2022   MM RT RADIOACTIVE SEED LOC MAMMO GUIDE 10/17/2022 [facility] MAMMOGRAPHY   BREAST EXCISIONAL BIOPSY Left 06/2019   benign   BREAST LUMPECTOMY WITH RADIOACTIVE SEED LOCALIZATION Left 07/08/2019   Procedure: LEFT BREAST LUMPECTOMY WITH RADIOACTIVE SEED LOCALIZATION;  Surgeon: Curvin Deward MOULD, MD;  Location: [facility];  Service: General;  Laterality: Left;   BREAST LUMPECTOMY WITH RADIOACTIVE SEED LOCALIZATION Right 10/18/2022   Procedure: RIGHT BREAST LUMPECTOMY WITH RADIOACTIVE SEED LOCALIZATION;  Surgeon: Curvin Deward MOULD, MD;  Location: [facility];  Service: General;  Laterality: Right;   COLONOSCOPY  2009   neg; [facility]   thyroid  cyst aspirated     TONSILLECTOMY     Patient Active Problem List   Diagnosis Date Noted   Chronic lower back pain 08/04/2024   CKD (chronic kidney disease) stage 2, GFR 60-89 ml/min 02/03/2024   Not well controlled moderate persistent asthma 09/18/2022   Intraductal papilloma of right breast 08/20/2022   Agatston coronary artery calcium score of 1,   03/2022 03/17/2022   Gastroesophageal reflux disease 08/31/2020   Cough 08/31/2020   Sleep difficulties 07/29/2020   Temporomandibular joint (TMJ) pain 06/16/2018   Anxiety 06/16/2018   Cervical adenopathy 06/16/2018   Jaw deformity 06/16/2018   Prediabetes 08/01/2017   Tingling of both feet 04/18/2016   Osteopenia 01/15/2016   Mild persistent asthma 01/10/2016   Other allergic rhinitis 01/10/2016   Hyperlipidemia 08/13/2008   Hypertension  09/22/2007   Allergic rhinitis 05/26/2007    PCP: Geofm Glade PARAS, MD   REFERRING PROVIDER: Geofm Glade PARAS, MD   REFERRING DIAG: M54.50,G89.29 (ICD-10-CM) - Chronic bilateral low back pain without sciatica  THERAPY DIAG:  Other low back pain  Muscle weakness (generalized)  RATIONALE FOR EVALUATION AND TREATMENT: Rehabilitation  ONSET DATE: chronic, years ago  NEXT MD VISIT:    SUBJECTIVE:  SUBJECTIVE STATEMENT: Today is a good day.  68 y/o patient referred to PT for chronic LBP.  States she has had pain for many years in her low back.  Denies any chiropractic or injections.   Has had xrays that showed DDD and facet OA.   She gets pain sitting upright without a back support on her chair.   She cannot tolerate sititng for more than a few minutes on the mat table today for the evaluation visit and has to sit in a chair with a back.   Rolling in bed and any twisting motions are most painful.   Ironing also increases pain.   Bending and doing laundry is ok. Very active, goes to [facility] hospital gym and has a systems analyst.   Walks 2.5 to 5 miles daily.   Does standing back rotation with cable cross over machine and it hurt today.  Does planks and can hold for 1 min with her trainer.   Lives with spouse who is 16 y/o, but she does not have to do any personal care as he is mobile and healthy.    PAIN: Are you having pain? Yes: NPRS scale: 2/10 now;  1/10 best over last week Pain location: central low back Pain description: aching, sore Aggravating factors: rolling over in bed Relieving factors: heat, not rolling in bed  PERTINENT HISTORY:  HTN, hyperlipidemia, osteopenia, prediabetes, CKD 2, R breast intraductal papilloma  PRECAUTIONS: None  RED FLAGS: None  WEIGHT BEARING  RESTRICTIONS: No  FALLS:  Has patient fallen in last 6 months? No  LIVING ENVIRONMENT: Lives with: lives with their family and lives with their spouse Lives in: House/apartment Stairs: Yes: External: 1 steps; none Has following equipment at home: None  OCCUPATION: retired surveyor, quantity  PLOF: Independent with gait  PATIENT GOALS: have less back pain and be able to move in bed without pain   OBJECTIVE: (objective measures completed at initial evaluation unless otherwise dated)  DIAGNOSTIC FINDINGS:  EXAM: 4 VIEW(S) XRAY OF THE LUMBAR SPINE 08/04/2024 10:49:47 AM   COMPARISON: None available.   CLINICAL HISTORY: chronic pain w/o radiculopathy. Back pain x several years, recently pain is worse. NKI.   FINDINGS:   LUMBAR SPINE:   BONES: No acute fracture. No aggressive appearing osseous lesion. Alignment is normal.   DISCS AND DEGENERATIVE CHANGES: Mild endplate remodeling is seen throughout the lumbar spine in keeping with changes of mild degenerative disc disease. Facet arthrosis at L5-S1 noted.   SOFT TISSUES: No acute abnormality.   IMPRESSION: 1. Mild degenerative disc disease with endplate remodeling throughout the lumbar spine. 2. Facet arthrosis at L5-S1.   Electronically signed by: Dorethia Molt MD 08/05/2024 09:39 PM EDT RP Workstation: [id number]  EXAM: 3 VIEW(S) XRAY OF THE THORACIC SPINE 08/04/2024 10:49:47 AM   COMPARISON: None available.   CLINICAL HISTORY: chronic pain w/o radiculopathy. Back pain x several years, recently pain is worse. NKI.   FINDINGS:   BONES: No acute fracture. No aggressive appearing osseous lesion. Alignment is normal.   DISCS AND DEGENERATIVE CHANGES: Mild endplate remodeling within the upper mid thoracic spine in keeping with mild degenerative disc disease.   SOFT TISSUES: The visualized lungs are clear.   IMPRESSION: 1. Mild degenerative disc disease in the mid thoracic spine.    Electronically signed by: Dorethia Molt MD 08/05/2024 09:40 PM EDT RP Workstation: [id number]  PATIENT SURVEYS:  Modified Oswestry:  MODIFIED OSWESTRY DISABILITY SCALE  Date: 08/25/24 Score  Pain intensity 3 =  Pain medication provides me with moderate relief from pain.  2. Personal care (washing, dressing, etc.) 1 =  I can take care of myself normally, but it increases my pain.  3. Lifting 1 = I can lift heavy weights, but it causes increased pain.  4. Walking 0 = Pain does not prevent me from walking any distance  5. Sitting 2 =  Pain prevents me from sitting more than 1 hour.  6. Standing 1 =  I can stand as long as I want but, it increases my pain.  7. Sleeping 3 =  Even when I take pain medication, I sleep less than 4 hours.  8. Social Life 0 = My social life is normal and does not increase my pain.  9. Traveling 2 =  My pain restricts my travel over 2 hours.  10. Employment/ Homemaking 2 = I can perform most of my homemaking/job duties, but pain prevents me from performing more physically stressful activities (eg, lifting, vacuuming).  Total 16/50 = 32%   Interpretation of scores: Score Category Description  0-20% Minimal Disability The patient can cope with most living activities. Usually no treatment is indicated apart from advice on lifting, sitting and exercise  21-40% Moderate Disability The patient experiences more pain and difficulty with sitting, lifting and standing. Travel and social life are more difficult and they may be disabled from work. Personal care, sexual activity and sleeping are not grossly affected, and the patient can usually be managed by conservative means  41-60% Severe Disability Pain remains the main problem in this group, but activities of daily living are affected. These patients require a detailed investigation  61-80% Crippled Back pain impinges on all aspects of the patient's life. Positive intervention is required  81-100% Bed-bound These patients  are either bed-bound or exaggerating their symptoms  Bluford FORBES Zoe DELENA Karon DELENA, et al. Surgery versus conservative management of stable thoracolumbar fracture: the PRESTO feasibility RCT. [place] ([place]): [employer]; 2021 Nov. [facility] Technology Assessment, No. 25.62.) Appendix 3, Oswestry Disability Index category descriptors. Available from: Findjewelers.cz  Minimally Clinically Important Difference (MCID) = 12.8%  SCREENING FOR RED FLAGS: Bowel or bladder incontinence: No Spinal tumors: No Cauda equina syndrome: No Compression fracture: No Abdominal aneurysm: No  COGNITION:  Overall cognitive status: Within functional limits for tasks assessed    SENSATION: WFL  POSTURE:  Supinated feet, lower R shoulder, leans a little to the R, fairly normal lordosis Supine:  R malleolus is slightly longer, R IC is slightly higher, ASIS appear equal  PALPATION: B paraspinals, L quadratus and L SI, and increased pain with   LUMBAR ROM:   Active  Eval  Flexion Mid shins, no pain  Extension 50%; tight at end range  Right lateral flexion Mid thigh  Left lateral flexion Upper thigh, pain and stiff  Right rotation 50%, stiff, some pain  Left rotation 75%, min pain  (Blank rows = not tested)  MUSCLE LENGTH: Hamstrings: Right SLR = 80 deg; Left SLR = 75 deg Thomas test: Right 0 deg; Left 0 deg Hamstrings: tight BLE ITB: Tight LLE Piriformis: mild tight BLE Hip flexors:  mild tight BLE Quads: NT Heelcord: NT  LOWER EXTREMITY ROM:     (Blank rows = not tested)  LOWER EXTREMITY MMT:   Active  Right eval Left eval  Hip flexion 5 5  Hip extension 3+ 4-  Hip abduction 5 4-  Hip adduction    Hip internal rotation 4+ 4  Hip  external rotation 5 5  Knee flexion 5 4  Knee extension 5 5  Ankle dorsiflexion 5 4  Ankle plantarflexion    Ankle inversion    Ankle eversion     LUMBAR SPECIAL TESTS:  Straight leg raise test: Negative, Slump  test: Negative, SI Compression/distraction test: Positive, FABER test: Positive, Gaenslen's test: Negative, and Thomas test: Negative;  FABER is positive on LLE only  FUNCTIONAL TESTS:   Long sit test RLE goes long to short at malleoli;  Standing:  Lower R shoulder;  supinator, L IT was tight, R piriformis causes some back pain with stretch;  R anterior/ L posterior pelvic rotation is a possibility  Long leg traction BLE relieves her back pain considerably  GAIT: Distance walked: into clinic x 150' Assistive device utilized: None Level of assistance: Complete Independence Gait pattern: very hard heel striker with supinatory roll BLE R >L; short quick step gait pattern, minimal trunk rotation Comments:    TODAY'S TREATMENT:  09/02/24 NEUROMUSCULAR RE-EDUCATION: To improve coordination, kinesthesia, posture, and proprioception.  Bridge knees bent BOSU ball 10x5  THERAPEUTIC EXERCISE: To improve strength, endurance, ROM, and flexibility.  Bike L4x80min Seated HS stretch BLE x 1 min Supine BLE thomas stretch x 1 min- increased LBP so taken out of HEP Supine KTOS stretch BLE x 1 min Sidelying hip ABD x 20 BLE  MANUAL THERAPY: To promote normalized muscle tension, improve joint mobility and/or for pain modulation  DTM to B thoracic paraspinals in prone IASTM with foam roll to thoracic paraspinals  Moist heat in supine to mid back x 8 min  08/25/24 SELF CARE: Provided education on PT POC progression., initial HEP  MODALITIES:   HP/premod ESTIM to B paraspinals from T8 to PSIS at machine default settings x 12-18 mA intensity x 15' in sitting (declined lying on back)   PATIENT EDUCATION:  Education details: PT eval findings, anticipated POC, and initial HEP  Person educated: Patient Education method: Explanation, Demonstration, Verbal cues, Tactile cues, Handouts, and MedBridgeGO app access provided Education comprehension: verbalized understanding, verbal cues required, tactile  cues required, and needs further education  HOME EXERCISE PROGRAM: Access Code: [email or web address] URL: [email or web address] Date: 08/25/2024 Prepared by: Garnette Montclair  Exercises - Seated Hamstring Stretch  - 1 x daily - 7 x weekly - 1 sets - 2 reps - 1 min hold - Hip Flexor Stretch at Edge of Bed  - 1 x daily - 7 x weekly - 1 sets - 2 reps - 1 min hold - Supine Piriformis Stretch with Foot on Ground  - 1 x daily - 7 x weekly - 1 sets - 2 reps - 1 min hold - Sidelying Hip Abduction  - 1 x daily - 7 x weekly - 3 sets - 10 reps   ASSESSMENT:  CLINICAL IMPRESSION: Good response to treatment. Discontinued hip flexor stretch from HEP d/t increased LBP. She well responded to the stretches. Presented with increased muscle tension B in thoracic paraspinals.  Zarielle Cea Spahr is a 68 y.o. female who was referred to physical therapy for evaluation and treatment for LBP.   Patient reports onset of LBP pain beginning years ago. Pain is worse with rolling over in bed or twisting in any position.   Xrays showed DDD and facet OA.  She goes to the gym and works with a systems analyst.   She has very good leg strength with the exception of some L hip weakness.    However, she is very stiff and  has some postural deficits.  L5 P/A glides are painful as well as rotational glides in each direction but particularly to the L.  Patient has deficits in lumbar ROM, lumbar/B LE flexibility, L hamstring, hip abductor, and extensor strength, abnormal posture, and TTP with abnormal muscle tension and pain which are interfering with ADLs and are impacting quality of life.  On Modified Oswestry patient scored 16/50 demonstrating 32% or moderate disability.  Samantha Shepard will benefit from skilled PT to address above deficits to improve mobility and activity tolerance with decreased pain interference.     OBJECTIVE IMPAIRMENTS: difficulty walking, decreased ROM, decreased strength, impaired flexibility, postural  dysfunction, and pain.   ACTIVITY LIMITATIONS: lifting, bending, standing, sleeping, and locomotion level  PARTICIPATION LIMITATIONS: cleaning, laundry, and community activity  PERSONAL FACTORS: Age, Time since onset of injury/illness/exacerbation, and 1-2 comorbidities: HTN, hyperlipidemia, osteopenia, prediabetes, CKD 2, R breast intraductal papilloma are also affecting patient's functional outcome.   REHAB POTENTIAL: Good  CLINICAL DECISION MAKING: Evolving/moderate complexity  EVALUATION COMPLEXITY: Moderate   GOALS: Goals reviewed with patient? Yes  SHORT TERM GOALS: Target date: 09/22/2024   Patient will be independent with initial HEP to improve outcomes and carryover.  Baseline: 100% PT assist required for correct completion Goal status: INITIAL  2.  Patient will report 25% improvement in low back pain to improve QOL. Baseline: 8/10 worst Goal status: INITIAL  3.  Patient will improve lumbar extension and sidebending to 75% ROM in all planes Baseline:  Goal status: INITIAL   LONG TERM GOALS: Target date: 10/20/2024   Patient will be independent with ongoing/advanced HEP for self-management at home.  Baseline: no advanced HEP yet Goal status: INITIAL  2.  Patient will report 50-75% improvement in low back pain to improve QOL.  Baseline: 8/10 Goal status: INITIAL  3.  Patient to demonstrate ability to achieve and maintain good spinal alignment/posturing and body mechanics needed for daily activities. Baseline: some R lateral lean/list Goal status: INITIAL  4.  Patient will demonstrate full pain free lumbar ROM to perform ADLs.   Baseline: Refer to above lumbar ROM table Goal status: INITIAL  5.  Patient will demonstrate improved BLE strength to >/= 5/5 for improved stability and ease of mobility. Baseline: Refer to above LE MMT table Goal status: INITIAL  6. Patient will report </= 20% on Modified Oswestry (MCID = 12%) to demonstrate improved functional  ability with decreased pain interference. Baseline: 32% Goal status: INITIAL  7.  Patient will tolerate 30 min of (standing/sitting/walking) and being in any position in bed w/o increased pain to allow for  improved mobility and activity tolerance. Baseline: pain with twisting in any position, pain sitting unsupported without back support Goal status: INITIAL    PLAN:  PT FREQUENCY: 1-2x/week  PT DURATION: 8 weeks  PLANNED INTERVENTIONS: 02835- PT Re-evaluation, 97750- Physical Performance Testing, 97110-Therapeutic exercises, 97530- Therapeutic activity, [id number]- Neuromuscular re-education, 97535- Self Care, 02859- Manual therapy, [id number]- Electrical stimulation (unattended), [id number]- Ultrasound, 02987- Traction (mechanical), [id number]- Ionotophoresis 4mg /ml Dexamethasone , 79439 (1-2 muscles), 20561 (3+ muscles)- Dry Needling, Patient/Family education, Taping, Joint mobilization, Spinal mobilization, Cryotherapy, and Moist heat  PLAN FOR NEXT SESSION: see how manual therapy helped last visit; check SI/long sit and add MET pelvic rotation stretching for anterior R inominate if indicated, try long leg traction afterwards   Sol LITTIE Gaskins, PTA 09/02/2024, 10:57 AM

## 2024-09-03 ENCOUNTER — Other Ambulatory Visit: Payer: Self-pay | Admitting: Family Medicine

## 2024-09-07 ENCOUNTER — Ambulatory Visit

## 2024-09-07 DIAGNOSIS — M5459 Other low back pain: Secondary | ICD-10-CM | POA: Insufficient documentation

## 2024-09-07 DIAGNOSIS — M6281 Muscle weakness (generalized): Secondary | ICD-10-CM | POA: Insufficient documentation

## 2024-09-07 NOTE — Therapy (Signed)
 OUTPATIENT PHYSICAL THERAPY THORACOLUMBAR TREATMENT   Patient Name: Samantha Shepard MRN: [id number] DOB:09-May-1956, 68 y.o., female Today's Date: 09/07/2024  END OF SESSION:  PT End of Session - 09/07/24 1308     Visit Number 3    Date for Recertification  10/20/24    PT Start Time 1147    PT Stop Time 1231    PT Time Calculation (min) 44 min    Activity Tolerance Patient tolerated treatment well;No increased pain    Behavior During Therapy WFL for tasks assessed/performed          Past Medical History:  Diagnosis Date   Allergy    dust, cats   Anxiety    Asthma    controlled   Hyperlipidemia    Hypertension    Left breast mass    Migraines    Past Surgical History:  Procedure Laterality Date   BREAST BIOPSY  10/17/2022   MM RT RADIOACTIVE SEED LOC MAMMO GUIDE 10/17/2022 [facility]AMMOGRAP[facility]   BREAST EXCISIONAL BIOPSY Left 06/2019   benign   BREAST LUMPECTOMY WITH RADIOACTIVE SEED LOCALIZATION Left 07/08/2019   Procedure: LEFT BREAST LUMPECTOMY WITH RADIOACTIVE SEED LOCALIZATION;  Surgeon: Curvin Deward MOULD, MD;  Location: [facility];  Service: General;  Laterality: Left;   BREAST LUMPECTOMY WITH RADIOACTIVE SEED LOCALIZATION Right 10/18/2022   Procedure: RIGHT BREAST LUMPECTOMY WITH RADIOACTIVE SEED LOCALIZATION;  Surgeon: Curvin Deward III, MD;  Location:  SUR[facility]ERY C[facility];  Service: General;  Laterality: Right;   COLONOSCOPY  2009   neg; [facility]   thyroid  cyst aspirated     TONSILLECTOMY     Patient Active Problem List   Diagnosis Date Noted   Chronic lower back pain 08/04/2024   CKD (chronic kidney disease) stage 2, GFR 60-89 ml/min 02/03/2024   Not well controlled moderate persistent asthma 09/18/2022   Intraductal papilloma of right breast 08/20/2022   Agatston coronary artery calcium score of 1,   03/2022 03/17/2022   Gastroesophageal reflux disease 08/31/2020   Cough 08/31/2020   Sleep difficulties 07/29/2020    Temporomandibular joint (TMJ) pain 06/16/2018   Anxiety 06/16/2018   Cervical adenopathy 06/16/2018   Jaw deformity 06/16/2018   Prediabetes 08/01/2017   Tingling of both feet 04/18/2016   Osteopenia 01/15/2016   Mild persistent asthma 01/10/2016   Other allergic rhinitis 01/10/2016   Hyperlipidemia 08/13/2008   Hypertension 09/22/2007   Allergic rhinitis 05/26/2007    PCP: Geofm Glade PARAS, MD   REFERRING PROVIDER: Geofm Glade PARAS, MD   REFERRING DIAG: M54.50,G89.29 (ICD-10-CM) - Chronic bilateral low back pain without sciatica  THERAPY DIAG:  Other low back pain  Muscle weakness (generalized)  RATIONALE FOR EVALUATION AND TREATMENT: Rehabilitation  ONSET DATE: chronic, years ago  NEXT MD VISIT:    SUBJECTIVE:  SUBJECTIVE STATEMENT: Increased soreness since last session  69 y/o patient referred to PT for chronic LBP.  States she has had pain for many years in her low back.  Denies any chiropractic or injections.   Has had xrays that showed DDD and facet OA.   She gets pain sitting upright without a back support on her chair.   She cannot tolerate sititng for more than a few minutes on the mat table today for the evaluation visit and has to sit in a chair with a back.   Rolling in bed and any twisting motions are most painful.   Ironing also increases pain.   Bending and doing laundry is ok. Very active, goes to [facility] hospital gym and has a systems analyst.   Walks 2.5 to 5 miles daily.   Does standing back rotation with cable cross over machine and it hurt today.  Does planks and can hold for 1 min with her trainer.   Lives with spouse who is 79 y/o, but she does not have to do any personal care as he is mobile and healthy.    PAIN: Are you having pain? Yes: NPRS scale: 4/10 now;  1/10  best over last week Pain location: central low back Pain description: aching, sore Aggravating factors: rolling over in bed Relieving factors: heat, not rolling in bed  PERTINENT HISTORY:  HTN, hyperlipidemia, osteopenia, prediabetes, CKD 2, R breast intraductal papilloma  PRECAUTIONS: None  RED FLAGS: None  WEIGHT BEARING RESTRICTIONS: No  FALLS:  Has patient fallen in last 6 months? No  LIVING ENVIRONMENT: Lives with: lives with their family and lives with their spouse Lives in: House/apartment Stairs: Yes: External: 1 steps; none Has following equipment at home: None  OCCUPATION: retired surveyor, quantity  PLOF: Independent with gait  PATIENT GOALS: have less back pain and be able to move in bed without pain   OBJECTIVE: (objective measures completed at initial evaluation unless otherwise dated)  DIAGNOSTIC FINDINGS:  EXAM: 4 VIEW(S) XRAY OF THE LUMBAR SPINE 08/04/2024 10:49:47 AM   COMPARISON: None available.   CLINICAL HISTORY: chronic pain w/o radiculopathy. Back pain x several years, recently pain is worse. NKI.   FINDINGS:   LUMBAR SPINE:   BONES: No acute fracture. No aggressive appearing osseous lesion. Alignment is normal.   DISCS AND DEGENERATIVE CHANGES: Mild endplate remodeling is seen throughout the lumbar spine in keeping with changes of mild degenerative disc disease. Facet arthrosis at L5-S1 noted.   SOFT TISSUES: No acute abnormality.   IMPRESSION: 1. Mild degenerative disc disease with endplate remodeling throughout the lumbar spine. 2. Facet arthrosis at L5-S1.   Electronically signed by: Dorethia Molt MD 08/05/2024 09:39 PM EDT RP Workstation: [id number]  EXAM: 3 VIEW(S) XRAY OF THE THORACIC SPINE 08/04/2024 10:49:47 AM   COMPARISON: None available.   CLINICAL HISTORY: chronic pain w/o radiculopathy. Back pain x several years, recently pain is worse. NKI.   FINDINGS:   BONES: No acute fracture. No  aggressive appearing osseous lesion. Alignment is normal.   DISCS AND DEGENERATIVE CHANGES: Mild endplate remodeling within the upper mid thoracic spine in keeping with mild degenerative disc disease.   SOFT TISSUES: The visualized lungs are clear.   IMPRESSION: 1. Mild degenerative disc disease in the mid thoracic spine.   Electronically signed by: Dorethia Molt MD 08/05/2024 09:40 PM EDT RP Workstation: [id number]  PATIENT SURVEYS:  Modified Oswestry:  MODIFIED OSWESTRY DISABILITY SCALE  Date: 08/25/24 Score  Pain intensity 3 =  Pain medication provides me with moderate relief from pain.  2. Personal care (washing, dressing, etc.) 1 =  I can take care of myself normally, but it increases my pain.  3. Lifting 1 = I can lift heavy weights, but it causes increased pain.  4. Walking 0 = Pain does not prevent me from walking any distance  5. Sitting 2 =  Pain prevents me from sitting more than 1 hour.  6. Standing 1 =  I can stand as long as I want but, it increases my pain.  7. Sleeping 3 =  Even when I take pain medication, I sleep less than 4 hours.  8. Social Life 0 = My social life is normal and does not increase my pain.  9. Traveling 2 =  My pain restricts my travel over 2 hours.  10. Employment/ Homemaking 2 = I can perform most of my homemaking/job duties, but pain prevents me from performing more physically stressful activities (eg, lifting, vacuuming).  Total 16/50 = 32%   Interpretation of scores: Score Category Description  0-20% Minimal Disability The patient can cope with most living activities. Usually no treatment is indicated apart from advice on lifting, sitting and exercise  21-40% Moderate Disability The patient experiences more pain and difficulty with sitting, lifting and standing. Travel and social life are more difficult and they may be disabled from work. Personal care, sexual activity and sleeping are not grossly affected, and the patient can usually be  managed by conservative means  41-60% Severe Disability Pain remains the main problem in this group, but activities of daily living are affected. These patients require a detailed investigation  61-80% Crippled Back pain impinges on all aspects of the patient's life. Positive intervention is required  81-100% Bed-bound These patients are either bed-bound or exaggerating their symptoms  Bluford FORBES Zoe DELENA Karon DELENA, et al. Surgery versus conservative management of stable thoracolumbar fracture: the PRESTO feasibility RCT. [place] ([place]): [employer]; 2021 Nov. [facility] Technology Assessment, No. 25.62.) Appendix 3, Oswestry Disability Index category descriptors. Available from: Findjewelers.cz  Minimally Clinically Important Difference (MCID) = 12.8%  SCREENING FOR RED FLAGS: Bowel or bladder incontinence: No Spinal tumors: No Cauda equina syndrome: No Compression fracture: No Abdominal aneurysm: No  COGNITION:  Overall cognitive status: Within functional limits for tasks assessed    SENSATION: WFL  POSTURE:  Supinated feet, lower R shoulder, leans a little to the R, fairly normal lordosis Supine:  R malleolus is slightly longer, R IC is slightly higher, ASIS appear equal  PALPATION: B paraspinals, L quadratus and L SI, and increased pain with   LUMBAR ROM:   Active  Eval  Flexion Mid shins, no pain  Extension 50%; tight at end range  Right lateral flexion Mid thigh  Left lateral flexion Upper thigh, pain and stiff  Right rotation 50%, stiff, some pain  Left rotation 75%, min pain  (Blank rows = not tested)  MUSCLE LENGTH: Hamstrings: Right SLR = 80 deg; Left SLR = 75 deg Thomas test: Right 0 deg; Left 0 deg Hamstrings: tight BLE ITB: Tight LLE Piriformis: mild tight BLE Hip flexors:  mild tight BLE Quads: NT Heelcord: NT  LOWER EXTREMITY ROM:     (Blank rows = not tested)  LOWER EXTREMITY MMT:   Active  Right eval Left  eval  Hip flexion 5 5  Hip extension 3+ 4-  Hip abduction 5 4-  Hip adduction    Hip internal rotation 4+ 4  Hip external rotation 5 5  Knee flexion 5 4  Knee extension 5 5  Ankle dorsiflexion 5 4  Ankle plantarflexion    Ankle inversion    Ankle eversion     LUMBAR SPECIAL TESTS:  Straight leg raise test: Negative, Slump test: Negative, SI Compression/distraction test: Positive, FABER test: Positive, Gaenslen's test: Negative, and Thomas test: Negative;  FABER is positive on LLE only  FUNCTIONAL TESTS:   Long sit test RLE goes long to short at malleoli;  Standing:  Lower R shoulder;  supinator, L IT was tight, R piriformis causes some back pain with stretch;  R anterior/ L posterior pelvic rotation is a possibility  Long leg traction BLE relieves her back pain considerably  GAIT: Distance walked: into clinic x 150' Assistive device utilized: None Level of assistance: Complete Independence Gait pattern: very hard heel striker with supinatory roll BLE R >L; short quick step gait pattern, minimal trunk rotation Comments:    TODAY'S TREATMENT:  09/07/24 NEUROMUSCULAR RE-EDUCATION: To improve coordination, kinesthesia, posture, and proprioception.  Bridges x 30 Supine hip ADD with TRA x 20 Supine hip ABD with TRA x 10 blue TB Bridges blue TB x 20 B hip flexion with ball squeeze x 12 supine MET shotgun technique for pelvic misalignment, RLE was longer after supine to long sit test Able to correct after METs Standing trunk rotation at wall Thread the needle on elevated mat table to L side  THERAPEUTIC EXERCISE: To improve strength, endurance, ROM, and flexibility.  Bike L4x61min LTR both ways x 10 B  09/02/24 NEUROMUSCULAR RE-EDUCATION: To improve coordination, kinesthesia, posture, and proprioception.  Bridge knees bent BOSU ball 10x5  THERAPEUTIC EXERCISE: To improve strength, endurance, ROM, and flexibility.  Bike L4x74min Seated HS stretch BLE x 1 min Supine BLE  thomas stretch x 1 min- increased LBP so taken out of HEP Supine KTOS stretch BLE x 1 min Sidelying hip ABD x 20 BLE  MANUAL THERAPY: To promote normalized muscle tension, improve joint mobility and/or for pain modulation  DTM to B thoracic paraspinals in prone IASTM with foam roll to thoracic paraspinals  Moist heat in supine to mid back x 8 min  08/25/24 SELF CARE: Provided education on PT POC progression., initial HEP  MODALITIES:   HP/premod ESTIM to B paraspinals from T8 to PSIS at machine default settings x 12-18 mA intensity x 15' in sitting (declined lying on back)   PATIENT EDUCATION:  Education details: PT eval findings, anticipated POC, and initial HEP  Person educated: Patient Education method: Explanation, Demonstration, Verbal cues, Tactile cues, Handouts, and MedBridgeGO app access provided Education comprehension: verbalized understanding, verbal cues required, tactile cues required, and needs further education  HOME EXERCISE PROGRAM: Access Code: [email or web address] URL: [email or web address] Date: 08/25/2024 Prepared by: Garnette Montclair  Exercises - Seated Hamstring Stretch  - 1 x daily - 7 x weekly - 1 sets - 2 reps - 1 min hold - Hip Flexor Stretch at Edge of Bed  - 1 x daily - 7 x weekly - 1 sets - 2 reps - 1 min hold - Supine Piriformis Stretch with Foot on Ground  - 1 x daily - 7 x weekly - 1 sets - 2 reps - 1 min hold - Sidelying Hip Abduction  - 1 x daily - 7 x weekly - 3 sets - 10 reps   ASSESSMENT:  CLINICAL IMPRESSION: Pt showed a good response to treatment. We revamped her HEP because it was increasing her pain. Able to  correct pelvic misalignment today with shotgun techniques and work on thoraco-lumbar mobility as well. Pt continues to benefit from skilled therapy.  Melaney Tellefsen Duff is a 68 y.o. female who was referred to physical therapy for evaluation and treatment for LBP.   Patient reports onset of LBP pain beginning years ago.  Pain is worse with rolling over in bed or twisting in any position.   Xrays showed DDD and facet OA.  She goes to the gym and works with a systems analyst.   She has very good leg strength with the exception of some L hip weakness.    However, she is very stiff and has some postural deficits.  L5 P/A glides are painful as well as rotational glides in each direction but particularly to the L.  Patient has deficits in lumbar ROM, lumbar/B LE flexibility, L hamstring, hip abductor, and extensor strength, abnormal posture, and TTP with abnormal muscle tension and pain which are interfering with ADLs and are impacting quality of life.  On Modified Oswestry patient scored 16/50 demonstrating 32% or moderate disability.  Jearldean will benefit from skilled PT to address above deficits to improve mobility and activity tolerance with decreased pain interference.     OBJECTIVE IMPAIRMENTS: difficulty walking, decreased ROM, decreased strength, impaired flexibility, postural dysfunction, and pain.   ACTIVITY LIMITATIONS: lifting, bending, standing, sleeping, and locomotion level  PARTICIPATION LIMITATIONS: cleaning, laundry, and community activity  PERSONAL FACTORS: Age, Time since onset of injury/illness/exacerbation, and 1-2 comorbidities: HTN, hyperlipidemia, osteopenia, prediabetes, CKD 2, R breast intraductal papilloma are also affecting patient's functional outcome.   REHAB POTENTIAL: Good  CLINICAL DECISION MAKING: Evolving/moderate complexity  EVALUATION COMPLEXITY: Moderate   GOALS: Goals reviewed with patient? Yes  SHORT TERM GOALS: Target date: 09/22/2024   Patient will be independent with initial HEP to improve outcomes and carryover.  Baseline: 100% PT assist required for correct completion Goal status: INITIAL  2.  Patient will report 25% improvement in low back pain to improve QOL. Baseline: 8/10 worst Goal status: IN PROGRESS- increased from initial HEP, revamped today 09/07/24  3.   Patient will improve lumbar extension and sidebending to 75% ROM in all planes Baseline:  Goal status: INITIAL   LONG TERM GOALS: Target date: 10/20/2024   Patient will be independent with ongoing/advanced HEP for self-management at home.  Baseline: no advanced HEP yet Goal status: INITIAL  2.  Patient will report 50-75% improvement in low back pain to improve QOL.  Baseline: 8/10 Goal status: INITIAL  3.  Patient to demonstrate ability to achieve and maintain good spinal alignment/posturing and body mechanics needed for daily activities. Baseline: some R lateral lean/list Goal status: INITIAL  4.  Patient will demonstrate full pain free lumbar ROM to perform ADLs.   Baseline: Refer to above lumbar ROM table Goal status: INITIAL  5.  Patient will demonstrate improved BLE strength to >/= 5/5 for improved stability and ease of mobility. Baseline: Refer to above LE MMT table Goal status: INITIAL  6. Patient will report </= 20% on Modified Oswestry (MCID = 12%) to demonstrate improved functional ability with decreased pain interference. Baseline: 32% Goal status: INITIAL  7.  Patient will tolerate 30 min of (standing/sitting/walking) and being in any position in bed w/o increased pain to allow for  improved mobility and activity tolerance. Baseline: pain with twisting in any position, pain sitting unsupported without back support Goal status: INITIAL    PLAN:  PT FREQUENCY: 1-2x/week  PT DURATION: 8 weeks  PLANNED  INTERVENTIONS: 97164- PT Re-evaluation, 97750- Physical Performance Testing, 97110-Therapeutic exercises, 97530- Therapeutic activity, [id number]- Neuromuscular re-education, [phone]- Self Care, [id number]- Manual therapy, [id number]- Electrical stimulation (unattended), [phone]- Ultrasound, [id number]- Traction (mechanical), [id number]- Ionotophoresis 4mg /ml Dexamethasone , [id number] (1-2 muscles), [id number] (3+ muscles)- Dry Needling, Patient/Family education, Taping, Joint mobilization, Spinal  mobilization, Cryotherapy, and Moist heat  PLAN FOR NEXT SESSION: reassess SI/long sit and add MET pelvic rotation stretching for anterior R inominate if indicated, continue hip strengthening while offloading R SIJ   Jeremey Bascom L Salil Raineri, PTA 09/07/2024, 1:10 PM

## 2024-09-09 ENCOUNTER — Encounter: Payer: Self-pay | Admitting: Rehabilitation

## 2024-09-09 ENCOUNTER — Ambulatory Visit: Admitting: Rehabilitation

## 2024-09-09 DIAGNOSIS — M5459 Other low back pain: Secondary | ICD-10-CM | POA: Diagnosis not present

## 2024-09-09 DIAGNOSIS — M6281 Muscle weakness (generalized): Secondary | ICD-10-CM

## 2024-09-09 NOTE — Therapy (Signed)
 OUTPATIENT PHYSICAL THERAPY THORACOLUMBAR TREATMENT   Patient Name: Samantha Shepard MRN: [id number] DOB:December 15, 1955, 68 y.o., female Today's Date: 09/09/2024  END OF SESSION:  PT End of Session - 09/09/24 1102     Visit Number 4    Date for Recertification  10/20/24    PT Start Time 1101    PT Stop Time 1158    PT Time Calculation (min) 57 min    Activity Tolerance Patient tolerated treatment well;No increased pain    Behavior During Therapy WFL for tasks assessed/performed          Past Medical History:  Diagnosis Date   Allergy    dust, cats   Anxiety    Asthma    controlled   Hyperlipidemia    Hypertension    Left breast mass    Migraines    Past Surgical History:  Procedure Laterality Date   BREAST BIOPSY  10/17/2022   MM RT RADIOACTIVE SEED LOC MAMMO GUIDE 10/17/2022 [facility]   BREAST EXCISIONAL BIOPSY Left 06/2019   benign   BREAST LUMPECTOMY WITH RADIOACTIVE SEED LOCALIZATION Left 07/08/2019   Procedure: LEFT BREAST LUMPECTOMY WITH RADIOACTIVE SEED LOCALIZATION;  Surgeon: Curvin Deward MOULD, MD;  Location:  SUR[facility]ERY CENTER;  Service: General;  Laterality: Left;   BREAST LUMPECTOMY WITH RADIOACTIVE SEED LOCALIZATION Right 10/18/2022   Procedure: RIGHT BREAST LUMPECTOMY WITH RADIOACTIVE SEED LOCALIZATION;  Surgeon: Curvin Deward III, MD;  Location:  SURGERY CENTER;  Service: General;  Laterality: Right;   COLONOSCOPY  2009   neg; [facility]   thyroid  cyst aspirated     TONSILLECTOMY     Patient Active Problem List   Diagnosis Date Noted   Chronic lower back pain 08/04/2024   CKD (chronic kidney disease) stage 2, GFR 60-89 ml/min 02/03/2024   Not well controlled moderate persistent asthma 09/18/2022   Intraductal papilloma of right breast 08/20/2022   Agatston coronary artery calcium score of 1,   03/2022 03/17/2022   Gastroesophageal reflux disease 08/31/2020   Cough 08/31/2020   Sleep difficulties 07/29/2020    Temporomandibular joint (TMJ) pain 06/16/2018   Anxiety 06/16/2018   Cervical adenopathy 06/16/2018   Jaw deformity 06/16/2018   Prediabetes 08/01/2017   Tingling of both feet 04/18/2016   Osteopenia 01/15/2016   Mild persistent asthma 01/10/2016   Other allergic rhinitis 01/10/2016   Hyperlipidemia 08/13/2008   Hypertension 09/22/2007   Allergic rhinitis 05/26/2007    PCP: Geofm Glade PARAS, MD   REFERRING PROVIDER: Geofm Glade PARAS, MD   REFERRING DIAG: M54.50,G89.29 (ICD-10-CM) - Chronic bilateral low back pain without sciatica  THERAPY DIAG:  Other low back pain  Muscle weakness (generalized)  RATIONALE FOR EVALUATION AND TREATMENT: Rehabilitation  ONSET DATE: chronic, years ago  NEXT MD VISIT:    SUBJECTIVE:  SUBJECTIVE STATEMENT: Patient reports feeling better today.  Rates pain 2/10 in the L low back.   She relates that the pain runs from her L upper thoracic spine in fairly straight line down her back to the SI area, but feel it is doing better  68 y/o patient referred to PT for chronic LBP.  States she has had pain for many years in her low back.  Denies any chiropractic or injections.   Has had xrays that showed DDD and facet OA.   She gets pain sitting upright without a back support on her chair.   She cannot tolerate sititng for more than a few minutes on the mat table today for the evaluation visit and has to sit in a chair with a back.   Rolling in bed and any twisting motions are most painful.   Ironing also increases pain.   Bending and doing laundry is ok. Very active, goes to [facility] hospital gym and has a systems analyst.   Walks 2.5 to 5 miles daily.   Does standing back rotation with cable cross over machine and it hurt today.  Does planks and can hold for 1 min with her  trainer.   Lives with spouse who is 50 y/o, but she does not have to do any personal care as he is mobile and healthy.    PAIN: Are you having pain? Yes: NPRS scale: 4/10 now;  1/10 best over last week Pain location: central low back Pain description: aching, sore Aggravating factors: rolling over in bed Relieving factors: heat, not rolling in bed  PERTINENT HISTORY:  HTN, hyperlipidemia, osteopenia, prediabetes, CKD 2, R breast intraductal papilloma  PRECAUTIONS: None  RED FLAGS: None  WEIGHT BEARING RESTRICTIONS: No  FALLS:  Has patient fallen in last 6 months? No  LIVING ENVIRONMENT: Lives with: lives with their family and lives with their spouse Lives in: House/apartment Stairs: Yes: External: 1 steps; none Has following equipment at home: None  OCCUPATION: retired surveyor, quantity  PLOF: Independent with gait  PATIENT GOALS: have less back pain and be able to move in bed without pain   OBJECTIVE: (objective measures completed at initial evaluation unless otherwise dated)  DIAGNOSTIC FINDINGS:  EXAM: 4 VIEW(S) XRAY OF THE LUMBAR SPINE 08/04/2024 10:49:47 AM   COMPARISON: None available.   CLINICAL HISTORY: chronic pain w/o radiculopathy. Back pain x several years, recently pain is worse. NKI.   FINDINGS:   LUMBAR SPINE:   BONES: No acute fracture. No aggressive appearing osseous lesion. Alignment is normal.   DISCS AND DEGENERATIVE CHANGES: Mild endplate remodeling is seen throughout the lumbar spine in keeping with changes of mild degenerative disc disease. Facet arthrosis at L5-S1 noted.   SOFT TISSUES: No acute abnormality.   IMPRESSION: 1. Mild degenerative disc disease with endplate remodeling throughout the lumbar spine. 2. Facet arthrosis at L5-S1.   Electronically signed by: Dorethia Molt MD 08/05/2024 09:39 PM EDT RP Workstation: [id number]  EXAM: 3 VIEW(S) XRAY OF THE THORACIC SPINE 08/04/2024 10:49:47 AM    COMPARISON: None available.   CLINICAL HISTORY: chronic pain w/o radiculopathy. Back pain x several years, recently pain is worse. NKI.   FINDINGS:   BONES: No acute fracture. No aggressive appearing osseous lesion. Alignment is normal.   DISCS AND DEGENERATIVE CHANGES: Mild endplate remodeling within the upper mid thoracic spine in keeping with mild degenerative disc disease.   SOFT TISSUES: The visualized lungs are clear.   IMPRESSION: 1. Mild degenerative disc disease  in the mid thoracic spine.   Electronically signed by: Dorethia Molt MD 08/05/2024 09:40 PM EDT RP Workstation: [id number]  PATIENT SURVEYS:  Modified Oswestry:  MODIFIED OSWESTRY DISABILITY SCALE  Date: 08/25/24 Score  Pain intensity 3 =  Pain medication provides me with moderate relief from pain.  2. Personal care (washing, dressing, etc.) 1 =  I can take care of myself normally, but it increases my pain.  3. Lifting 1 = I can lift heavy weights, but it causes increased pain.  4. Walking 0 = Pain does not prevent me from walking any distance  5. Sitting 2 =  Pain prevents me from sitting more than 1 hour.  6. Standing 1 =  I can stand as long as I want but, it increases my pain.  7. Sleeping 3 =  Even when I take pain medication, I sleep less than 4 hours.  8. Social Life 0 = My social life is normal and does not increase my pain.  9. Traveling 2 =  My pain restricts my travel over 2 hours.  10. Employment/ Homemaking 2 = I can perform most of my homemaking/job duties, but pain prevents me from performing more physically stressful activities (eg, lifting, vacuuming).  Total 16/50 = 32%   Interpretation of scores: Score Category Description  0-20% Minimal Disability The patient can cope with most living activities. Usually no treatment is indicated apart from advice on lifting, sitting and exercise  21-40% Moderate Disability The patient experiences more pain and difficulty with sitting, lifting and  standing. Travel and social life are more difficult and they may be disabled from work. Personal care, sexual activity and sleeping are not grossly affected, and the patient can usually be managed by conservative means  41-60% Severe Disability Pain remains the main problem in this group, but activities of daily living are affected. These patients require a detailed investigation  61-80% Crippled Back pain impinges on all aspects of the patient's life. Positive intervention is required  81-100% Bed-bound These patients are either bed-bound or exaggerating their symptoms  Bluford FORBES Zoe DELENA Karon DELENA, et al. Surgery versus conservative management of stable thoracolumbar fracture: the PRESTO feasibility RCT. [place] ([place]): [employer]; 2021 Nov. [facility] Technology Assessment, No. 25.62.) Appendix 3, Oswestry Disability Index category descriptors. Available from: Findjewelers.cz  Minimally Clinically Important Difference (MCID) = 12.8%  SCREENING FOR RED FLAGS: Bowel or bladder incontinence: No Spinal tumors: No Cauda equina syndrome: No Compression fracture: No Abdominal aneurysm: No  COGNITION:  Overall cognitive status: Within functional limits for tasks assessed    SENSATION: WFL  POSTURE:  Supinated feet, lower R shoulder, leans a little to the R, fairly normal lordosis Supine:  R malleolus is slightly longer, R IC is slightly higher, ASIS appear equal  PALPATION: B paraspinals, L quadratus and L SI, and increased pain with   LUMBAR ROM:   Active  Eval  Flexion Mid shins, no pain  Extension 50%; tight at end range  Right lateral flexion Mid thigh  Left lateral flexion Upper thigh, pain and stiff  Right rotation 50%, stiff, some pain  Left rotation 75%, min pain  (Blank rows = not tested)  MUSCLE LENGTH: Hamstrings: Right SLR = 80 deg; Left SLR = 75 deg Thomas test: Right 0 deg; Left 0 deg Hamstrings: tight BLE ITB: Tight  LLE Piriformis: mild tight BLE Hip flexors:  mild tight BLE Quads: NT Heelcord: NT  LOWER EXTREMITY ROM:     (Blank rows =  not tested)  LOWER EXTREMITY MMT:   Active  Right eval Left eval  Hip flexion 5 5  Hip extension 3+ 4-  Hip abduction 5 4-  Hip adduction    Hip internal rotation 4+ 4  Hip external rotation 5 5  Knee flexion 5 4  Knee extension 5 5  Ankle dorsiflexion 5 4  Ankle plantarflexion    Ankle inversion    Ankle eversion     LUMBAR SPECIAL TESTS:  Straight leg raise test: Negative, Slump test: Negative, SI Compression/distraction test: Positive, FABER test: Positive, Gaenslen's test: Negative, and Thomas test: Negative;  FABER is positive on LLE only  FUNCTIONAL TESTS:   Long sit test RLE goes long to short at malleoli;  Standing:  Lower R shoulder;  supinator, L IT was tight, R piriformis causes some back pain with stretch;  R anterior/ L posterior pelvic rotation is a possibility  Long leg traction BLE relieves her back pain considerably  GAIT: Distance walked: into clinic x 150' Assistive device utilized: None Level of assistance: Complete Independence Gait pattern: very hard heel striker with supinatory roll BLE R >L; short quick step gait pattern, minimal trunk rotation Comments:    TODAY'S TREATMENT:  09/09/24 THERAPEUTIC EXERCISE: To improve strength and endurance.  Demonstration, verbal and tactile cues throughout for technique. Bike L4 x 6'  THERAPEUTIC ACTIVITIES: To improve functional performance.  Demonstration, verbal and tactile cues throughout for technique. Seated hamstretch x 1' x 2 BLE Supine long sit test is only very slight long to short on the RLE;  malleoli are equal in supine Supine scapular depression BUE Supine pelvic depression w/ arms full flexed and relaxed overhead x 15 each; leg one at a time Supine L hip flexor stretch w/ SKTC on RLE x 1' w/ isometric hip extension on RLE--patient reports gets L foot numbness in this  position Supine bridge w/ blue TB to knees x 20 BLE Supine SLR ham stretch by PT x 1' x 3 RLE Seated swiss ball rollouts x 10 from 11:00, 12:00, 1:00 Standing RUE wall reach w/ trunk SB stretch to the L x 1' x 2   Modified self MET for anterior R inominate w/ R SKTC w/ isometric hip ext  MANUAL THERAPY: To promote reduced pain utilizing joint mobilization. Long leg traction x 10-20 gentle grade 3-4 reps to each leg individually  MODALITIES: Static pelvic traction at neutral pull from 90/90 position at 77 lbs pull for 155 lb body weight x 5'--tolerates well, feels relief afterwards  09/07/24 NEUROMUSCULAR RE-EDUCATION: To improve coordination, kinesthesia, posture, and proprioception.  Bridges x 30 Supine hip ADD with TRA x 20 Supine hip ABD with TRA x 10 blue TB Bridges blue TB x 20 B hip flexion with ball squeeze x 12 supine MET shotgun technique for pelvic misalignment, RLE was longer after supine to long sit test Able to correct after METs Standing trunk rotation at wall Thread the needle on elevated mat table to L side  THERAPEUTIC EXERCISE: To improve strength, endurance, ROM, and flexibility.  Bike L4x3min LTR both ways x 10 B  09/02/24 NEUROMUSCULAR RE-EDUCATION: To improve coordination, kinesthesia, posture, and proprioception.  Bridge knees bent BOSU ball 10x5  THERAPEUTIC EXERCISE: To improve strength, endurance, ROM, and flexibility.  Bike L4x68min Seated HS stretch BLE x 1 min Supine BLE thomas stretch x 1 min- increased LBP so taken out of HEP Supine KTOS stretch BLE x 1 min Sidelying hip ABD x 20 BLE  MANUAL THERAPY:  To promote normalized muscle tension, improve joint mobility and/or for pain modulation  DTM to B thoracic paraspinals in prone IASTM with foam roll to thoracic paraspinals  Moist heat in supine to mid back x 8 min  08/25/24 SELF CARE: Provided education on PT POC progression., initial HEP  MODALITIES:   HP/premod ESTIM to B paraspinals from  T8 to PSIS at machine default settings x 12-18 mA intensity x 15' in sitting (declined lying on back)   PATIENT EDUCATION:  Education details: adding L sidebending stretch at wall to HEP  Person educated: Patient Education method: Explanation, Demonstration, Verbal cues, Tactile cues, Handouts, and MedBridgeGO app access provided Education comprehension: verbalized understanding, verbal cues required, tactile cues required, and needs further education  HOME EXERCISE PROGRAM: Access Code: [email or web address] URL: [email or web address] Date: 08/25/2024 Prepared by: Garnette Montclair  Exercises - Seated Hamstring Stretch  - 1 x daily - 7 x weekly - 1 sets - 2 reps - 1 min hold - Hip Flexor Stretch at Edge of Bed  - 1 x daily - 7 x weekly - 1 sets - 2 reps - 1 min hold - Supine Piriformis Stretch with Foot on Ground  - 1 x daily - 7 x weekly - 1 sets - 2 reps - 1 min hold - Sidelying Hip Abduction  - 1 x daily - 7 x weekly - 3 sets - 10 reps   ASSESSMENT:  CLINICAL IMPRESSION: Pt showed a good response to treatment. We revamped her HEP because it was increasing her pain. Able to correct pelvic misalignment today with shotgun techniques and work on thoraco-lumbar mobility as well. Pt continues to benefit from skilled therapy.  Marvine Encalade Shepard is a 68 y.o. female who was referred to physical therapy for evaluation and treatment for LBP.   Patient reports onset of LBP pain beginning years ago. Pain is worse with rolling over in bed or twisting in any position.   Xrays showed DDD and facet OA.  She goes to the gym and works with a systems analyst.   She has very good leg strength with the exception of some L hip weakness.    However, she is very stiff and has some postural deficits.  L5 P/A glides are painful as well as rotational glides in each direction but particularly to the L.  Patient has deficits in lumbar ROM, lumbar/B LE flexibility, L hamstring, hip abductor, and extensor  strength, abnormal posture, and TTP with abnormal muscle tension and pain which are interfering with ADLs and are impacting quality of life.  On Modified Oswestry patient scored 16/50 demonstrating 32% or moderate disability.  Samantha Shepard will benefit from skilled PT to address above deficits to improve mobility and activity tolerance with decreased pain interference.     OBJECTIVE IMPAIRMENTS: difficulty walking, decreased ROM, decreased strength, impaired flexibility, postural dysfunction, and pain.   ACTIVITY LIMITATIONS: lifting, bending, standing, sleeping, and locomotion level  PARTICIPATION LIMITATIONS: cleaning, laundry, and community activity  PERSONAL FACTORS: Age, Time since onset of injury/illness/exacerbation, and 1-2 comorbidities: HTN, hyperlipidemia, osteopenia, prediabetes, CKD 2, R breast intraductal papilloma are also affecting patient's functional outcome.   REHAB POTENTIAL: Good  CLINICAL DECISION MAKING: Evolving/moderate complexity  EVALUATION COMPLEXITY: Moderate   GOALS: Goals reviewed with patient? Yes  SHORT TERM GOALS: Target date: 09/22/2024   Patient will be independent with initial HEP to improve outcomes and carryover.  Baseline: 100% PT assist required for correct completion 09/09/24:  can teach back Goal status: MET  2.  Patient will report 25% improvement in low back pain to improve QOL. Baseline: 8/10 worst 09/09/24: 2/10 today Goal status: MET-  3.  Patient will improve lumbar extension and sidebending to 75% ROM in all planes Baseline:  Goal status: INITIAL   LONG TERM GOALS: Target date: 10/20/2024   Patient will be independent with ongoing/advanced HEP for self-management at home.  Baseline: no advanced HEP yet Goal status: INITIAL  2.  Patient will report 50-75% improvement in low back pain to improve QOL.  Baseline: 8/10 Goal status: IN PROGRESS  3.  Patient to demonstrate ability to achieve and maintain good spinal  alignment/posturing and body mechanics needed for daily activities. Baseline: some R lateral lean/list Goal status: INITIAL  4.  Patient will demonstrate full pain free lumbar ROM to perform ADLs.   Baseline: Refer to above lumbar ROM table Goal status: INITIAL  5.  Patient will demonstrate improved BLE strength to >/= 5/5 for improved stability and ease of mobility. Baseline: Refer to above LE MMT table Goal status: INITIAL  6. Patient will report </= 20% on Modified Oswestry (MCID = 12%) to demonstrate improved functional ability with decreased pain interference. Baseline: 32% Goal status: INITIAL  7.  Patient will tolerate 30 min of (standing/sitting/walking) and being in any position in bed w/o increased pain to allow for  improved mobility and activity tolerance. Baseline: pain with twisting in any position, pain sitting unsupported without back support Goal status: INITIAL    PLAN:  PT FREQUENCY: 1-2x/week  PT DURATION: 8 weeks  PLANNED INTERVENTIONS: 02835- PT Re-evaluation, 97750- Physical Performance Testing, 97110-Therapeutic exercises, 97530- Therapeutic activity, [id number]- Neuromuscular re-education, 97535- Self Care, 02859- Manual therapy, G0283- Electrical stimulation (unattended), [id number]- Ultrasound, 02987- Traction (mechanical), [id number]- Ionotophoresis 4mg /ml Dexamethasone , 79439 (1-2 muscles), 20561 (3+ muscles)- Dry Needling, Patient/Family education, Taping, Joint mobilization, Spinal mobilization, Cryotherapy, and Moist heat  PLAN FOR NEXT SESSION: see if traction helped, recheck lumbar ROM, check long sit test, continue w/ MFR vs modalities if she continues to get pain relief with these  Harvis Mabus, PT 09/09/2024, 2:53 PM

## 2024-09-14 ENCOUNTER — Ambulatory Visit

## 2024-09-14 DIAGNOSIS — M6281 Muscle weakness (generalized): Secondary | ICD-10-CM

## 2024-09-14 DIAGNOSIS — M5459 Other low back pain: Secondary | ICD-10-CM

## 2024-09-14 NOTE — Therapy (Signed)
 OUTPATIENT PHYSICAL THERAPY THORACOLUMBAR TREATMENT   Patient Name: Amorie Rentz MRN: [id number] DOB:05-Jul-1956, 68 y.o., female Today's Date: 09/14/2024  END OF SESSION:  PT End of Session - 09/14/24 1228     Visit Number 5    Date for Recertification  10/20/24    PT Start Time 1146    PT Stop Time 1228    PT Time Calculation (min) 42 min    Activity Tolerance Patient tolerated treatment well;No increased pain    Behavior During Therapy WFL for tasks assessed/performed           Past Medical History:  Diagnosis Date   Allergy    dust, cats   Anxiety    Asthma    controlled   Hyperlipidemia    Hypertension    Left breast mass    Migraines    Past Surgical History:  Procedure Laterality Date   BREAST BIOPSY  10/17/2022   MM RT RADIOACTIVE SEED LOC MAMMO GUIDE 10/17/2022 [facility] MAMMOGRAP[facility]   BREAST EXCISIONAL BIOPSY Left 06/2019   benign   BREAST LUMPECTOMY WITH RADIOACTIVE SEED LOCALIZATION Left 07/08/2019   Procedure: LEFT BREAST LUMPECTOMY WITH RADIOACTIVE SEED LOCALIZATION;  Surgeon: Curvin Deward MOULD, MD;  Location: [facility];  Service: General;  Laterality: Left;   BREAST LUMPECTOMY WITH RADIOACTIVE SEED LOCALIZATION Right 10/18/2022   Procedure: RIGHT BREAST LUMPECTOMY WITH RADIOACTIVE SEED LOCALIZATION;  Surgeon: Curvin Deward MOULD, MD;  Location: [facility];  Service: General;  Laterality: Right;   COLONOSCOPY  2009   neg; [facility]   thyroid  cyst aspirated     TONSILLECTOMY     Patient Active Problem List   Diagnosis Date Noted   Chronic lower back pain 08/04/2024   CKD (chronic kidney disease) stage 2, GFR 60-89 ml/min 02/03/2024   Not well controlled moderate persistent asthma 09/18/2022   Intraductal papilloma of right breast 08/20/2022   Agatston coronary artery calcium score of 1,   03/2022 03/17/2022   Gastroesophageal reflux disease 08/31/2020   Cough 08/31/2020   Sleep difficulties 07/29/2020    Temporomandibular joint (TMJ) pain 06/16/2018   Anxiety 06/16/2018   Cervical adenopathy 06/16/2018   Jaw deformity 06/16/2018   Prediabetes 08/01/2017   Tingling of both feet 04/18/2016   Osteopenia 01/15/2016   Mild persistent asthma 01/10/2016   Other allergic rhinitis 01/10/2016   Hyperlipidemia 08/13/2008   Hypertension 09/22/2007   Allergic rhinitis 05/26/2007    PCP: Geofm Glade PARAS, MD   REFERRING PROVIDER: Geofm Glade PARAS, MD   REFERRING DIAG: M54.50,G89.29 (ICD-10-CM) - Chronic bilateral low back pain without sciatica  THERAPY DIAG:  Other low back pain  Muscle weakness (generalized)  RATIONALE FOR EVALUATION AND TREATMENT: Rehabilitation  ONSET DATE: chronic, years ago  NEXT MD VISIT:    SUBJECTIVE:  SUBJECTIVE STATEMENT: Pt reports the traction seemed to help, today is a good day.  68 y/o patient referred to PT for chronic LBP.  States she has had pain for many years in her low back.  Denies any chiropractic or injections.   Has had xrays that showed DDD and facet OA.   She gets pain sitting upright without a back support on her chair.   She cannot tolerate sititng for more than a few minutes on the mat table today for the evaluation visit and has to sit in a chair with a back.   Rolling in bed and any twisting motions are most painful.   Ironing also increases pain.   Bending and doing laundry is ok. Very active, goes to [facility] hospital gym and has a systems analyst.   Walks 2.5 to 5 miles daily.   Does standing back rotation with cable cross over machine and it hurt today.  Does planks and can hold for 1 min with her trainer.   Lives with spouse who is 20 y/o, but she does not have to do any personal care as he is mobile and healthy.    PAIN: Are you having pain? Yes: NPRS  scale: 3/10 now;  1/10 best over last week Pain location: central low back Pain description: aching, sore Aggravating factors: rolling over in bed Relieving factors: heat, not rolling in bed  PERTINENT HISTORY:  HTN, hyperlipidemia, osteopenia, prediabetes, CKD 2, R breast intraductal papilloma  PRECAUTIONS: None  RED FLAGS: None  WEIGHT BEARING RESTRICTIONS: No  FALLS:  Has patient fallen in last 6 months? No  LIVING ENVIRONMENT: Lives with: lives with their family and lives with their spouse Lives in: House/apartment Stairs: Yes: External: 1 steps; none Has following equipment at home: None  OCCUPATION: retired surveyor, quantity  PLOF: Independent with gait  PATIENT GOALS: have less back pain and be able to move in bed without pain   OBJECTIVE: (objective measures completed at initial evaluation unless otherwise dated)  DIAGNOSTIC FINDINGS:  EXAM: 4 VIEW(S) XRAY OF THE LUMBAR SPINE 08/04/2024 10:49:47 AM   COMPARISON: None available.   CLINICAL HISTORY: chronic pain w/o radiculopathy. Back pain x several years, recently pain is worse. NKI.   FINDINGS:   LUMBAR SPINE:   BONES: No acute fracture. No aggressive appearing osseous lesion. Alignment is normal.   DISCS AND DEGENERATIVE CHANGES: Mild endplate remodeling is seen throughout the lumbar spine in keeping with changes of mild degenerative disc disease. Facet arthrosis at L5-S1 noted.   SOFT TISSUES: No acute abnormality.   IMPRESSION: 1. Mild degenerative disc disease with endplate remodeling throughout the lumbar spine. 2. Facet arthrosis at L5-S1.   Electronically signed by: Dorethia Molt MD 08/05/2024 09:39 PM EDT RP Workstation: [id number]  EXAM: 3 VIEW(S) XRAY OF THE THORACIC SPINE 08/04/2024 10:49:47 AM   COMPARISON: None available.   CLINICAL HISTORY: chronic pain w/o radiculopathy. Back pain x several years, recently pain is worse. NKI.   FINDINGS:   BONES: No  acute fracture. No aggressive appearing osseous lesion. Alignment is normal.   DISCS AND DEGENERATIVE CHANGES: Mild endplate remodeling within the upper mid thoracic spine in keeping with mild degenerative disc disease.   SOFT TISSUES: The visualized lungs are clear.   IMPRESSION: 1. Mild degenerative disc disease in the mid thoracic spine.   Electronically signed by: Dorethia Molt MD 08/05/2024 09:40 PM EDT RP Workstation: [id number]  PATIENT SURVEYS:  Modified Oswestry:  MODIFIED OSWESTRY DISABILITY SCALE  Date:  08/25/24 Score  Pain intensity 3 =  Pain medication provides me with moderate relief from pain.  2. Personal care (washing, dressing, etc.) 1 =  I can take care of myself normally, but it increases my pain.  3. Lifting 1 = I can lift heavy weights, but it causes increased pain.  4. Walking 0 = Pain does not prevent me from walking any distance  5. Sitting 2 =  Pain prevents me from sitting more than 1 hour.  6. Standing 1 =  I can stand as long as I want but, it increases my pain.  7. Sleeping 3 =  Even when I take pain medication, I sleep less than 4 hours.  8. Social Life 0 = My social life is normal and does not increase my pain.  9. Traveling 2 =  My pain restricts my travel over 2 hours.  10. Employment/ Homemaking 2 = I can perform most of my homemaking/job duties, but pain prevents me from performing more physically stressful activities (eg, lifting, vacuuming).  Total 16/50 = 32%   Interpretation of scores: Score Category Description  0-20% Minimal Disability The patient can cope with most living activities. Usually no treatment is indicated apart from advice on lifting, sitting and exercise  21-40% Moderate Disability The patient experiences more pain and difficulty with sitting, lifting and standing. Travel and social life are more difficult and they may be disabled from work. Personal care, sexual activity and sleeping are not grossly affected, and the patient  can usually be managed by conservative means  41-60% Severe Disability Pain remains the main problem in this group, but activities of daily living are affected. These patients require a detailed investigation  61-80% Crippled Back pain impinges on all aspects of the patient's life. Positive intervention is required  81-100% Bed-bound These patients are either bed-bound or exaggerating their symptoms  Bluford FORBES Zoe DELENA Karon DELENA, et al. Surgery versus conservative management of stable thoracolumbar fracture: the PRESTO feasibility RCT. [place] ([place]): [employer]; 2021 Nov. [facility] Technology Assessment, No. 25.62.) Appendix 3, Oswestry Disability Index category descriptors. Available from: [email or web address].cz  Minimally Clinically Important Difference (MCID) = 12.8%  SCREENING FOR RED FLAGS: Bowel or bladder incontinence: No Spinal tumors: No Cauda equina syndrome: No Compression fracture: No Abdominal aneurysm: No  COGNITION:  Overall cognitive status: Within functional limits for tasks assessed    SENSATION: WFL  POSTURE:  Supinated feet, lower R shoulder, leans a little to the R, fairly normal lordosis Supine:  R malleolus is slightly longer, R IC is slightly higher, ASIS appear equal  PALPATION: B paraspinals, L quadratus and L SI, and increased pain with   LUMBAR ROM:   Active  Eval  Flexion Mid shins, no pain  Extension 50%; tight at end range  Right lateral flexion Mid thigh  Left lateral flexion Upper thigh, pain and stiff  Right rotation 50%, stiff, some pain  Left rotation 75%, min pain  (Blank rows = not tested)  MUSCLE LENGTH: Hamstrings: Right SLR = 80 deg; Left SLR = 75 deg Thomas test: Right 0 deg; Left 0 deg Hamstrings: tight BLE ITB: Tight LLE Piriformis: mild tight BLE Hip flexors:  mild tight BLE Quads: NT Heelcord: NT  LOWER EXTREMITY ROM:     (Blank rows = not tested)  LOWER EXTREMITY MMT:   Active   Right eval Left eval  Hip flexion 5 5  Hip extension 3+ 4-  Hip abduction 5 4-  Hip adduction  Hip internal rotation 4+ 4  Hip external rotation 5 5  Knee flexion 5 4  Knee extension 5 5  Ankle dorsiflexion 5 4  Ankle plantarflexion    Ankle inversion    Ankle eversion     LUMBAR SPECIAL TESTS:  Straight leg raise test: Negative, Slump test: Negative, SI Compression/distraction test: Positive, FABER test: Positive, Gaenslen's test: Negative, and Thomas test: Negative;  FABER is positive on LLE only  FUNCTIONAL TESTS:   Long sit test RLE goes long to short at malleoli;  Standing:  Lower R shoulder;  supinator, L IT was tight, R piriformis causes some back pain with stretch;  R anterior/ L posterior pelvic rotation is a possibility  Long leg traction BLE relieves her back pain considerably  GAIT: Distance walked: into clinic x 150' Assistive device utilized: None Level of assistance: Complete Independence Gait pattern: very hard heel striker with supinatory roll BLE R >L; short quick step gait pattern, minimal trunk rotation Comments:    TODAY'S TREATMENT:  09/14/24 Bike L3x39min Leg press 55lb x 20 BLE Standing cable rows 20lb x 20 Standing shoulder extension 20lb x 20 Sidesteps RTB at ankles 2x Standing hip abduction RTB 2x10 Standing hip extension RTB 2x10 Supine LTR both ways, DKTC 09/09/24 THERAPEUTIC EXERCISE: To improve strength and endurance.  Demonstration, verbal and tactile cues throughout for technique. Bike L4 x 6'  THERAPEUTIC ACTIVITIES: To improve functional performance.  Demonstration, verbal and tactile cues throughout for technique. Seated hamstretch x 1' x 2 BLE Supine long sit test is only very slight long to short on the RLE;  malleoli are equal in supine Supine scapular depression BUE Supine pelvic depression w/ arms full flexed and relaxed overhead x 15 each; leg one at a time Supine L hip flexor stretch w/ SKTC on RLE x 1' w/ isometric hip  extension on RLE--patient reports gets L foot numbness in this position Supine bridge w/ blue TB to knees x 20 BLE Supine SLR ham stretch by PT x 1' x 3 RLE Seated swiss ball rollouts x 10 from 11:00, 12:00, 1:00 Standing RUE wall reach w/ trunk SB stretch to the L x 1' x 2   Modified self MET for anterior R inominate w/ R SKTC w/ isometric hip ext  MANUAL THERAPY: To promote reduced pain utilizing joint mobilization. Long leg traction x 10-20 gentle grade 3-4 reps to each leg individually  MODALITIES: Static pelvic traction at neutral pull from 90/90 position at 77 lbs pull for 155 lb body weight x 5'--tolerates well, feels relief afterwards  09/07/24 NEUROMUSCULAR RE-EDUCATION: To improve coordination, kinesthesia, posture, and proprioception.  Bridges x 30 Supine hip ADD with TRA x 20 Supine hip ABD with TRA x 10 blue TB Bridges blue TB x 20 B hip flexion with ball squeeze x 12 supine MET shotgun technique for pelvic misalignment, RLE was longer after supine to long sit test Able to correct after METs Standing trunk rotation at wall Thread the needle on elevated mat table to L side  THERAPEUTIC EXERCISE: To improve strength, endurance, ROM, and flexibility.  Bike L4x28min LTR both ways x 10 B  09/02/24 NEUROMUSCULAR RE-EDUCATION: To improve coordination, kinesthesia, posture, and proprioception.  Bridge knees bent BOSU ball 10x5  THERAPEUTIC EXERCISE: To improve strength, endurance, ROM, and flexibility.  Bike L4x69min Seated HS stretch BLE x 1 min Supine BLE thomas stretch x 1 min- increased LBP so taken out of HEP Supine KTOS stretch BLE x 1 min Sidelying hip ABD  x 20 BLE  MANUAL THERAPY: To promote normalized muscle tension, improve joint mobility and/or for pain modulation  DTM to B thoracic paraspinals in prone IASTM with foam roll to thoracic paraspinals  Moist heat in supine to mid back x 8 min  08/25/24 SELF CARE: Provided education on PT POC progression.,  initial HEP  MODALITIES:   HP/premod ESTIM to B paraspinals from T8 to PSIS at machine default settings x 12-18 mA intensity x 15' in sitting (declined lying on back)   PATIENT EDUCATION:  Education details: adding L sidebending stretch at wall to HEP  Person educated: Patient Education method: Explanation, Demonstration, Verbal cues, Tactile cues, Handouts, and MedBridgeGO app access provided Education comprehension: verbalized understanding, verbal cues required, tactile cues required, and needs further education  HOME EXERCISE PROGRAM: Access Code: [email or web address] URL: [email or web address] Date: 08/25/2024 Prepared by: Garnette Montclair  Exercises - Seated Hamstring Stretch  - 1 x daily - 7 x weekly - 1 sets - 2 reps - 1 min hold - Hip Flexor Stretch at Edge of Bed  - 1 x daily - 7 x weekly - 1 sets - 2 reps - 1 min hold - Supine Piriformis Stretch with Foot on Ground  - 1 x daily - 7 x weekly - 1 sets - 2 reps - 1 min hold - Sidelying Hip Abduction  - 1 x daily - 7 x weekly - 3 sets - 10 reps   ASSESSMENT:  CLINICAL IMPRESSION: Pt responded well to treatment today. Increased strengthening focus today with good response. Cuing given throughout session for form and technique. Will continue to progress as tolerated.   Emonni Depasquale Leffel is a 68 y.o. female who was referred to physical therapy for evaluation and treatment for LBP.   Patient reports onset of LBP pain beginning years ago. Pain is worse with rolling over in bed or twisting in any position.   Xrays showed DDD and facet OA.  She goes to the gym and works with a systems analyst.   She has very good leg strength with the exception of some L hip weakness.    However, she is very stiff and has some postural deficits.  L5 P/A glides are painful as well as rotational glides in each direction but particularly to the L.  Patient has deficits in lumbar ROM, lumbar/B LE flexibility, L hamstring, hip abductor, and  extensor strength, abnormal posture, and TTP with abnormal muscle tension and pain which are interfering with ADLs and are impacting quality of life.  On Modified Oswestry patient scored 16/50 demonstrating 32% or moderate disability.  Aydia will benefit from skilled PT to address above deficits to improve mobility and activity tolerance with decreased pain interference.     OBJECTIVE IMPAIRMENTS: difficulty walking, decreased ROM, decreased strength, impaired flexibility, postural dysfunction, and pain.   ACTIVITY LIMITATIONS: lifting, bending, standing, sleeping, and locomotion level  PARTICIPATION LIMITATIONS: cleaning, laundry, and community activity  PERSONAL FACTORS: Age, Time since onset of injury/illness/exacerbation, and 1-2 comorbidities: HTN, hyperlipidemia, osteopenia, prediabetes, CKD 2, R breast intraductal papilloma are also affecting patient's functional outcome.   REHAB POTENTIAL: Good  CLINICAL DECISION MAKING: Evolving/moderate complexity  EVALUATION COMPLEXITY: Moderate   GOALS: Goals reviewed with patient? Yes  SHORT TERM GOALS: Target date: 09/22/2024   Patient will be independent with initial HEP to improve outcomes and carryover.  Baseline: 100% PT assist required for correct completion 09/09/24:  can teach back Goal status: MET  2.  Patient will report 25% improvement in  low back pain to improve QOL. Baseline: 8/10 worst 09/09/24: 2/10 today Goal status: MET-  3.  Patient will improve lumbar extension and sidebending to 75% ROM in all planes Baseline:  Goal status: INITIAL   LONG TERM GOALS: Target date: 10/20/2024   Patient will be independent with ongoing/advanced HEP for self-management at home.  Baseline: no advanced HEP yet Goal status: INITIAL  2.  Patient will report 50-75% improvement in low back pain to improve QOL.  Baseline: 8/10 Goal status: IN PROGRESS  3.  Patient to demonstrate ability to achieve and maintain good spinal  alignment/posturing and body mechanics needed for daily activities. Baseline: some R lateral lean/list Goal status: INITIAL  4.  Patient will demonstrate full pain free lumbar ROM to perform ADLs.   Baseline: Refer to above lumbar ROM table Goal status: INITIAL  5.  Patient will demonstrate improved BLE strength to >/= 5/5 for improved stability and ease of mobility. Baseline: Refer to above LE MMT table Goal status: INITIAL  6. Patient will report </= 20% on Modified Oswestry (MCID = 12%) to demonstrate improved functional ability with decreased pain interference. Baseline: 32% Goal status: INITIAL  7.  Patient will tolerate 30 min of (standing/sitting/walking) and being in any position in bed w/o increased pain to allow for  improved mobility and activity tolerance. Baseline: pain with twisting in any position, pain sitting unsupported without back support Goal status: INITIAL    PLAN:  PT FREQUENCY: 1-2x/week  PT DURATION: 8 weeks  PLANNED INTERVENTIONS: 02835- PT Re-evaluation, 97750- Physical Performance Testing, 97110-Therapeutic exercises, 97530- Therapeutic activity, [id number]- Neuromuscular re-education, 97535- Self Care, 02859- Manual therapy, [id number]- Electrical stimulation (unattended), 97035- Ultrasound, 02987- Traction (mechanical), [id number]- Ionotophoresis 4mg /ml Dexamethasone , 79439 (1-2 muscles), 20561 (3+ muscles)- Dry Needling, Patient/Family education, Taping, Joint mobilization, Spinal mobilization, Cryotherapy, and Moist heat  PLAN FOR NEXT SESSION: see if traction helped, recheck lumbar ROM, check long sit test, continue w/ MFR vs modalities if she continues to get pain relief with these  Gala Padovano L Lord Lancour, PTA 09/14/2024, 12:42 PM

## 2024-09-17 ENCOUNTER — Ambulatory Visit: Admitting: Rehabilitation

## 2024-09-17 ENCOUNTER — Encounter: Payer: Self-pay | Admitting: Rehabilitation

## 2024-09-17 DIAGNOSIS — M6281 Muscle weakness (generalized): Secondary | ICD-10-CM

## 2024-09-17 DIAGNOSIS — M5459 Other low back pain: Secondary | ICD-10-CM | POA: Diagnosis not present

## 2024-09-17 NOTE — Therapy (Signed)
 OUTPATIENT PHYSICAL THERAPY THORACOLUMBAR TREATMENT   Patient Name: Meiah Zamudio MRN: [id number] DOB:01/17/1956, 68 y.o., female Today's Date: 09/17/2024  END OF SESSION:  PT End of Session - 09/17/24 1141     Visit Number 6    Date for Recertification  10/20/24    PT Start Time 1141    PT Stop Time 1222    PT Time Calculation (min) 41 min    Activity Tolerance Patient tolerated treatment well;No increased pain    Behavior During Therapy WFL for tasks assessed/performed           Past Medical History:  Diagnosis Date   Allergy    dust, cats   Anxiety    Asthma    controlled   Hyperlipidemia    Hypertension    Left breast mass    Migraines    Past Surgical History:  Procedure Laterality Date   BREAST BIOPSY  10/17/2022   MM RT RADIOACTIVE SEED LOC MAMMO GUIDE 10/17/2022 [facility] MAMMOGRAP[facility]   BREAST EXCISIONAL BIOPSY Left 06/2019   benign   BREAST LUMPECTOMY WITH RADIOACTIVE SEED LOCALIZATION Left 07/08/2019   Procedure: LEFT BREAST LUMPECTOMY WITH RADIOACTIVE SEED LOCALIZATION;  Surgeon: Curvin Deward MOULD, MD;  Location: [facility];  Service: General;  Laterality: Left;   BREAST LUMPECTOMY WITH RADIOACTIVE SEED LOCALIZATION Right 10/18/2022   Procedure: RIGHT BREAST LUMPECTOMY WITH RADIOACTIVE SEED LOCALIZATION;  Surgeon: Curvin Deward III, MD;  Location:  SUR[facility]ERY C[facility];  Service: General;  Laterality: Right;   COLONOSCOPY  2009   neg; [facility]   thyroid  cyst aspirated     TONSILLECTOMY     Patient Active Problem List   Diagnosis Date Noted   Chronic lower back pain 08/04/2024   CKD (chronic kidney disease) stage 2, GFR 60-89 ml/min 02/03/2024   Not well controlled moderate persistent asthma 09/18/2022   Intraductal papilloma of right breast 08/20/2022   Agatston coronary artery calcium score of 1,   03/2022 03/17/2022   Gastroesophageal reflux disease 08/31/2020   Cough 08/31/2020   Sleep difficulties 07/29/2020    Temporomandibular joint (TMJ) pain 06/16/2018   Anxiety 06/16/2018   Cervical adenopathy 06/16/2018   Jaw deformity 06/16/2018   Prediabetes 08/01/2017   Tingling of both feet 04/18/2016   Osteopenia 01/15/2016   Mild persistent asthma 01/10/2016   Other allergic rhinitis 01/10/2016   Hyperlipidemia 08/13/2008   Hypertension 09/22/2007   Allergic rhinitis 05/26/2007    PCP: Geofm Glade PARAS, MD   REFERRING PROVIDER: Geofm Glade PARAS, MD   REFERRING DIAG: M54.50,G89.29 (ICD-10-CM) - Chronic bilateral low back pain without sciatica  THERAPY DIAG:  Other low back pain  Muscle weakness (generalized)  RATIONALE FOR EVALUATION AND TREATMENT: Rehabilitation  ONSET DATE: chronic, years ago  NEXT MD VISIT:    SUBJECTIVE:  SUBJECTIVE STATEMENT: Patient reports doing better.   Rates pain 2/10 in the low back today.   States she is doing her HEP daily without any problems.  68 y/o patient referred to PT for chronic LBP.  States she has had pain for many years in her low back.  Denies any chiropractic or injections.   Has had xrays that showed DDD and facet OA.   She gets pain sitting upright without a back support on her chair.   She cannot tolerate sititng for more than a few minutes on the mat table today for the evaluation visit and has to sit in a chair with a back.   Rolling in bed and any twisting motions are most painful.   Ironing also increases pain.   Bending and doing laundry is ok. Very active, goes to [facility] hospital gym and has a systems analyst.   Walks 2.5 to 5 miles daily.   Does standing back rotation with cable cross over machine and it hurt today.  Does planks and can hold for 1 min with her trainer.   Lives with spouse who is 67 y/o, but she does not have to do any personal care as  he is mobile and healthy.    PAIN: Are you having pain? Yes: NPRS scale: 3/10 now;  1/10 best over last week Pain location: central low back Pain description: aching, sore Aggravating factors: rolling over in bed Relieving factors: heat, not rolling in bed  PERTINENT HISTORY:  HTN, hyperlipidemia, osteopenia, prediabetes, CKD 2, R breast intraductal papilloma  PRECAUTIONS: None  RED FLAGS: None  WEIGHT BEARING RESTRICTIONS: No  FALLS:  Has patient fallen in last 6 months? No  LIVING ENVIRONMENT: Lives with: lives with their family and lives with their spouse Lives in: House/apartment Stairs: Yes: External: 1 steps; none Has following equipment at home: None  OCCUPATION: retired surveyor, quantity  PLOF: Independent with gait  PATIENT GOALS: have less back pain and be able to move in bed without pain   OBJECTIVE: (objective measures completed at initial evaluation unless otherwise dated)  DIAGNOSTIC FINDINGS:  EXAM: 4 VIEW(S) XRAY OF THE LUMBAR SPINE 08/04/2024 10:49:47 AM   COMPARISON: None available.   CLINICAL HISTORY: chronic pain w/o radiculopathy. Back pain x several years, recently pain is worse. NKI.   FINDINGS:   LUMBAR SPINE:   BONES: No acute fracture. No aggressive appearing osseous lesion. Alignment is normal.   DISCS AND DEGENERATIVE CHANGES: Mild endplate remodeling is seen throughout the lumbar spine in keeping with changes of mild degenerative disc disease. Facet arthrosis at L5-S1 noted.   SOFT TISSUES: No acute abnormality.   IMPRESSION: 1. Mild degenerative disc disease with endplate remodeling throughout the lumbar spine. 2. Facet arthrosis at L5-S1.   Electronically signed by: Dorethia Molt MD 08/05/2024 09:39 PM EDT RP Workstation: [id number]  EXAM: 3 VIEW(S) XRAY OF THE THORACIC SPINE 08/04/2024 10:49:47 AM   COMPARISON: None available.   CLINICAL HISTORY: chronic pain w/o radiculopathy. Back pain x several  years, recently pain is worse. NKI.   FINDINGS:   BONES: No acute fracture. No aggressive appearing osseous lesion. Alignment is normal.   DISCS AND DEGENERATIVE CHANGES: Mild endplate remodeling within the upper mid thoracic spine in keeping with mild degenerative disc disease.   SOFT TISSUES: The visualized lungs are clear.   IMPRESSION: 1. Mild degenerative disc disease in the mid thoracic spine.   Electronically signed by: Dorethia Molt MD 08/05/2024 09:40 PM EDT RP Workstation:  [id number]  PATIENT SURVEYS:  Modified Oswestry:  MODIFIED OSWESTRY DISABILITY SCALE  Date: 08/25/24 Score  Pain intensity 3 =  Pain medication provides me with moderate relief from pain.  2. Personal care (washing, dressing, etc.) 1 =  I can take care of myself normally, but it increases my pain.  3. Lifting 1 = I can lift heavy weights, but it causes increased pain.  4. Walking 0 = Pain does not prevent me from walking any distance  5. Sitting 2 =  Pain prevents me from sitting more than 1 hour.  6. Standing 1 =  I can stand as long as I want but, it increases my pain.  7. Sleeping 3 =  Even when I take pain medication, I sleep less than 4 hours.  8. Social Life 0 = My social life is normal and does not increase my pain.  9. Traveling 2 =  My pain restricts my travel over 2 hours.  10. Employment/ Homemaking 2 = I can perform most of my homemaking/job duties, but pain prevents me from performing more physically stressful activities (eg, lifting, vacuuming).  Total 16/50 = 32%   Interpretation of scores: Score Category Description  0-20% Minimal Disability The patient can cope with most living activities. Usually no treatment is indicated apart from advice on lifting, sitting and exercise  21-40% Moderate Disability The patient experiences more pain and difficulty with sitting, lifting and standing. Travel and social life are more difficult and they may be disabled from work. Personal care, sexual  activity and sleeping are not grossly affected, and the patient can usually be managed by conservative means  41-60% Severe Disability Pain remains the main problem in this group, but activities of daily living are affected. These patients require a detailed investigation  61-80% Crippled Back pain impinges on all aspects of the patients life. Positive intervention is required  81-100% Bed-bound These patients are either bed-bound or exaggerating their symptoms  Bluford FORBES Zoe DELENA Karon DELENA, et al. Surgery versus conservative management of stable thoracolumbar fracture: the PRESTO feasibility RCT. [place] ([place]): [employer]; 2021 Nov. [facility] Technology Assessment, No. 25.62.) Appendix 3, Oswestry Disability Index category descriptors. Available from: Findjewelers.cz  Minimally Clinically Important Difference (MCID) = 12.8%  SCREENING FOR RED FLAGS: Bowel or bladder incontinence: No Spinal tumors: No Cauda equina syndrome: No Compression fracture: No Abdominal aneurysm: No  COGNITION:  Overall cognitive status: Within functional limits for tasks assessed    SENSATION: WFL  POSTURE:  Supinated feet, lower R shoulder, leans a little to the R, fairly normal lordosis Supine:  R malleolus is slightly longer, R IC is slightly higher, ASIS appear equal  PALPATION: B paraspinals, L quadratus and L SI, and increased pain with   LUMBAR ROM:   Active  Eval 09/17/24  Flexion Mid shins, no pain Mid shins, no pain  Extension 50%; tight at end range 75%  Right lateral flexion Mid thigh To knee  Left lateral flexion Upper thigh, pain and stiff To knee  Right rotation 50%, stiff, some pain 60%; end range discomfort  Left rotation 75%, min pain 100%, no pain  (Blank rows = not tested)  MUSCLE LENGTH: Hamstrings: Right SLR = 80 deg; Left SLR = 75 deg Thomas test: Right 0 deg; Left 0 deg Hamstrings: tight BLE ITB: Tight LLE Piriformis: mild tight  BLE Hip flexors:  mild tight BLE Quads: NT Heelcord: NT  LOWER EXTREMITY ROM:     (Blank rows = not tested)  LOWER EXTREMITY MMT:   Active  Right eval Left eval  Hip flexion 5 5  Hip extension 3+ 4-  Hip abduction 5 4-  Hip adduction    Hip internal rotation 4+ 4  Hip external rotation 5 5  Knee flexion 5 4  Knee extension 5 5  Ankle dorsiflexion 5 4  Ankle plantarflexion    Ankle inversion    Ankle eversion     LUMBAR SPECIAL TESTS:  Straight leg raise test: Negative, Slump test: Negative, SI Compression/distraction test: Positive, FABER test: Positive, Gaenslen's test: Negative, and Thomas test: Negative;  FABER is positive on LLE only  FUNCTIONAL TESTS:   Long sit test RLE goes long to short at malleoli;  Standing:  Lower R shoulder;  supinator, L IT was tight, R piriformis causes some back pain with stretch;  R anterior/ L posterior pelvic rotation is a possibility  Long leg traction BLE relieves her back pain considerably  GAIT: Distance walked: into clinic x 150' Assistive device utilized: None Level of assistance: Complete Independence Gait pattern: very hard heel striker with supinatory roll BLE R >L; short quick step gait pattern, minimal trunk rotation Comments:    TODAY'S TREATMENT:  09/17/24 THERAPEUTIC EXERCISE: To improve strength and endurance.  Demonstration, verbal and tactile cues throughout for technique. NuStep L5 x 5'  NEUROMUSCULAR RE-EDUCATION: To improve coordination, kinesthesia, posture, and proprioception. 65 cm swiss ball: Bouncing EO x 1'; EC x 1'  Marching alternately x 1'  Marching w/ alternate arm raise x 1'  Alternate knee extension x 1'  F/B rolling x 1' Supine w/ LE's on 55 cm swiss ball at 90/90  B shoulder ER RTB x 10  B shoulder HABD RTB x 10  B shoulder D2 flexion PNF x 10 each way  THERAPEUTIC ACTIVITIES: To improve functional performance.  Demonstration, verbal and tactile cues throughout for technique. Leg press 55lb  x 20 BLE Standing cable rows 20lb x 20 Standing shoulder extension 20lb x 20 R lateral shift w/ RUE wall slide/reach x 10 R QL stretch in doorway x 30 sec Child's pose stretch x 10 sec x 5 Hooklying curl up and reach x 2/10 BUE  Supine = level malleoli BLE Long sit test = no change  09/14/24 Bike L3x56min Leg press 55lb x 20 BLE Standing cable rows 20lb x 20 Standing shoulder extension 20lb x 20 Sidesteps RTB at ankles 2x Standing hip abduction RTB 2x10 Standing hip extension RTB 2x10 Supine LTR both ways, DKTC 09/09/24 THERAPEUTIC EXERCISE: To improve strength and endurance.  Demonstration, verbal and tactile cues throughout for technique. Bike L4 x 6'  THERAPEUTIC ACTIVITIES: To improve functional performance.  Demonstration, verbal and tactile cues throughout for technique. Seated hamstretch x 1' x 2 BLE Supine long sit test is only very slight long to short on the RLE;  malleoli are equal in supine Supine scapular depression BUE Supine pelvic depression w/ arms full flexed and relaxed overhead x 15 each; leg one at a time Supine L hip flexor stretch w/ SKTC on RLE x 1' w/ isometric hip extension on RLE--patient reports gets L foot numbness in this position Supine bridge w/ blue TB to knees x 20 BLE Supine SLR ham stretch by PT x 1' x 3 RLE Seated swiss ball rollouts x 10 from 11:00, 12:00, 1:00 Standing RUE wall reach w/ trunk SB stretch to the L x 1' x 2   Modified self MET for anterior R inominate w/ R SKTC w/ isometric  hip ext  MANUAL THERAPY: To promote reduced pain utilizing joint mobilization. Long leg traction x 10-20 gentle grade 3-4 reps to each leg individually  MODALITIES: Static pelvic traction at neutral pull from 90/90 position at 77 lbs pull for 155 lb body weight x 5'--tolerates well, feels relief afterwards  09/07/24 NEUROMUSCULAR RE-EDUCATION: To improve coordination, kinesthesia, posture, and proprioception.  Bridges x 30 Supine hip ADD with TRA x  20 Supine hip ABD with TRA x 10 blue TB Bridges blue TB x 20 B hip flexion with ball squeeze x 12 supine MET shotgun technique for pelvic misalignment, RLE was longer after supine to long sit test Able to correct after METs Standing trunk rotation at wall Thread the needle on elevated mat table to L side  THERAPEUTIC EXERCISE: To improve strength, endurance, ROM, and flexibility.  Bike L4x32min LTR both ways x 10 B  09/02/24 NEUROMUSCULAR RE-EDUCATION: To improve coordination, kinesthesia, posture, and proprioception.  Bridge knees bent BOSU ball 10x5  THERAPEUTIC EXERCISE: To improve strength, endurance, ROM, and flexibility.  Bike L4x2min Seated HS stretch BLE x 1 min Supine BLE thomas stretch x 1 min- increased LBP so taken out of HEP Supine KTOS stretch BLE x 1 min Sidelying hip ABD x 20 BLE  MANUAL THERAPY: To promote normalized muscle tension, improve joint mobility and/or for pain modulation  DTM to B thoracic paraspinals in prone IASTM with foam roll to thoracic paraspinals  Moist heat in supine to mid back x 8 min  08/25/24 SELF CARE: Provided education on PT POC progression., initial HEP  MODALITIES:   HP/premod ESTIM to B paraspinals from T8 to PSIS at machine default settings x 12-18 mA intensity x 15' in sitting (declined lying on back)   PATIENT EDUCATION:  Education details: adding L sidebending stretch at wall to HEP  Person educated: Patient Education method: Explanation, Demonstration, Verbal cues, Tactile cues, Handouts, and MedBridgeGO app access provided Education comprehension: verbalized understanding, verbal cues required, tactile cues required, and needs further education  HOME EXERCISE PROGRAM: Access Code: [email or web address] URL: [email or web address] Date: 09/17/2024 Prepared by: Garnette Montclair  Exercises - Supine Bridge  - 1 x daily - 7 x weekly - 3 sets - 10 reps - Supine Hip Adduction Isometric with Ball  - 1 x daily - 7 x weekly  - 3 sets - 10 reps - Hooklying Abduction with Resistance  - 1 x daily - 7 x weekly - 3 sets - 10 reps - Plank with Thoracic Rotation on Counter  - 1 x daily - 7 x weekly - 3 sets - 10 reps - Standing Quadratus Lumborum Stretch with Doorway  - 1 x daily - 7 x weekly - 1 sets - 2 reps - 1 min hold - Supine Shoulder External Rotation on Foam Roll with Theraband  - 1 x daily - 7 x weekly - 1 sets - 10 reps - Supine Shoulder External Rotation with Resistance  - 1 x daily - 7 x weekly - 1 sets - 10 reps - Supine Shoulder Horizontal Abduction with Resistance  - 1 x daily - 7 x weekly - 1 sets - 10 reps - Supine PNF D2 Flexion with Resistance  - 1 x daily - 7 x weekly - 1 sets - 10 reps - Curl Up with Reach  - 1 x daily - 7 x weekly - 2 sets - 10 reps - Side Stepping with Resistance at Thighs  - 1 x daily - 7 x weekly - 3  sets - 10 reps ASSESSMENT:  CLINICAL IMPRESSION: Since patient is feeling better we held traction today.  We can revisit this PRN.   Added in new HEP to medbridge.  Lumbar ROM is still restricted but improving.    PT remains necessary for ROM  Katelynn Heidler is a 68 y.o. female who was referred to physical therapy for evaluation and treatment for LBP.   Patient reports onset of LBP pain beginning years ago. Pain is worse with rolling over in bed or twisting in any position.   Xrays showed DDD and facet OA.  She goes to the gym and works with a systems analyst.   She has very good leg strength with the exception of some L hip weakness.    However, she is very stiff and has some postural deficits.  L5 P/A glides are painful as well as rotational glides in each direction but particularly to the L.  Patient has deficits in lumbar ROM, lumbar/B LE flexibility, L hamstring, hip abductor, and extensor strength, abnormal posture, and TTP with abnormal muscle tension and pain which are interfering with ADLs and are impacting quality of life.  On Modified Oswestry patient scored 16/50  demonstrating 32% or moderate disability.  Monet will benefit from skilled PT to address above deficits to improve mobility and activity tolerance with decreased pain interference.     OBJECTIVE IMPAIRMENTS: difficulty walking, decreased ROM, decreased strength, impaired flexibility, postural dysfunction, and pain.   ACTIVITY LIMITATIONS: lifting, bending, standing, sleeping, and locomotion level  PARTICIPATION LIMITATIONS: cleaning, laundry, and community activity  PERSONAL FACTORS: Age, Time since onset of injury/illness/exacerbation, and 1-2 comorbidities: HTN, hyperlipidemia, osteopenia, prediabetes, CKD 2, R breast intraductal papilloma are also affecting patient's functional outcome.   REHAB POTENTIAL: Good  CLINICAL DECISION MAKING: Evolving/moderate complexity  EVALUATION COMPLEXITY: Moderate   GOALS: Goals reviewed with patient? Yes  SHORT TERM GOALS: Target date: 09/22/2024   Patient will be independent with initial HEP to improve outcomes and carryover.  Baseline: 100% PT assist required for correct completion 09/09/24:  can teach back Goal status: MET  2.  Patient will report 25% improvement in low back pain to improve QOL. Baseline: 8/10 worst 09/09/24: 2/10 today Goal status: MET-  3.  Patient will improve lumbar extension and sidebending to 75% ROM in all planes Baseline: 09/17/24 updated Goal status: MET   LONG TERM GOALS: Target date: 10/20/2024   Patient will be independent with ongoing/advanced HEP for self-management at home.  Baseline: no advanced HEP yet Goal status: INITIAL  2.  Patient will report 50-75% improvement in low back pain to improve QOL.  Baseline: 8/10 Goal status: IN PROGRESS  3.  Patient to demonstrate ability to achieve and maintain good spinal alignment/posturing and body mechanics needed for daily activities. Baseline: some R lateral lean/list Goal status: INITIAL  4.  Patient will demonstrate full pain free lumbar ROM to  perform ADLs.   Baseline: Refer to above lumbar ROM table 09/17/24:  see updated tables above Goal status: IN PROGRESS  5.  Patient will demonstrate improved BLE strength to >/= 5/5 for improved stability and ease of mobility. Baseline: Refer to above LE MMT table Goal status: INITIAL  6. Patient will report </= 20% on Modified Oswestry (MCID = 12%) to demonstrate improved functional ability with decreased pain interference. Baseline: 32% Goal status: INITIAL  7.  Patient will tolerate 30 min of (standing/sitting/walking) and being in any position in bed w/o increased pain to allow for  improved  mobility and activity tolerance. Baseline: pain with twisting in any position, pain sitting unsupported without back support Goal status: INITIAL    PLAN:  PT FREQUENCY: 1-2x/week  PT DURATION: 8 weeks  PLANNED INTERVENTIONS: 97164- PT Re-evaluation, 97750- Physical Performance Testing, 97110-Therapeutic exercises, 97530- Therapeutic activity, [id number]- Neuromuscular re-education, 97535- Self Care, [id number]- Manual therapy, [id number]- Electrical stimulation (unattended), [id number]- Ultrasound, 02987- Traction (mechanical), [id number]- Ionotophoresis 4mg /ml Dexamethasone , 79439 (1-2 muscles), 20561 (3+ muscles)- Dry Needling, Patient/Family education, Taping, Joint mobilization, Spinal mobilization, Cryotherapy, and Moist heat  PLAN FOR NEXT SESSION:  See how new exercises are doing, review HEP for new additions, recheck strength, continue w/ MFR vs modalities PRN  Gentry Pilson, PT 09/17/2024, 5:06 PM

## 2024-09-21 ENCOUNTER — Ambulatory Visit

## 2024-09-21 DIAGNOSIS — M5459 Other low back pain: Secondary | ICD-10-CM | POA: Diagnosis not present

## 2024-09-21 DIAGNOSIS — M6281 Muscle weakness (generalized): Secondary | ICD-10-CM

## 2024-09-21 NOTE — Therapy (Signed)
 OUTPATIENT PHYSICAL THERAPY THORACOLUMBAR TREATMENT   Patient Name: Boneta Standre MRN: [id number] DOB:09/08/1956, 68 y.o., female Today's Date: 09/21/2024  END OF SESSION:  PT End of Session - 09/21/24 1151     Visit Number 7    Date for Recertification  10/20/24    PT Start Time 1103    PT Stop Time 1145    PT Time Calculation (min) 42 min    Activity Tolerance Patient tolerated treatment well;No increased pain    Behavior During Therapy WFL for tasks assessed/performed            Past Medical History:  Diagnosis Date   Allergy    dust, cats   Anxiety    Asthma    controlled   Hyperlipidemia    Hypertension    Left breast mass    Migraines    Past Surgical History:  Procedure Laterality Date   BREAST BIOPSY  10/17/2022   MM RT RADIOACTIVE SEED LOC MAMMO GUIDE 10/17/2022 [facility] MAMMOGRAPHY   BREAST EXCISIONAL BIOPSY Left 06/2019   benign   BREAST LUMPECTOMY WITH RADIOACTIVE SEED LOCALIZATION Left 07/08/2019   Procedure: LEFT BREAST LUMPECTOMY WITH RADIOACTIVE SEED LOCALIZATION;  Surgeon: Curvin Deward MOULD, MD;  Location: [facility];  Service: General;  Laterality: Left;   BREAST LUMPECTOMY WITH RADIOACTIVE SEED LOCALIZATION Right 10/18/2022   Procedure: RIGHT BREAST LUMPECTOMY WITH RADIOACTIVE SEED LOCALIZATION;  Surgeon: Curvin Deward III, MD;  Location: [facility];  Service: General;  Laterality: Right;   COLONOSCOPY  2009   neg; [facility]   thyroid  cyst aspirated     TONSILLECTOMY     Patient Active Problem List   Diagnosis Date Noted   Chronic lower back pain 08/04/2024   CKD (chronic kidney disease) stage 2, GFR 60-89 ml/min 02/03/2024   Not well controlled moderate persistent asthma 09/18/2022   Intraductal papilloma of right breast 08/20/2022   Agatston coronary artery calcium score of 1,   03/2022 03/17/2022   Gastroesophageal reflux disease 08/31/2020   Cough 08/31/2020   Sleep difficulties 07/29/2020    Temporomandibular joint (TMJ) pain 06/16/2018   Anxiety 06/16/2018   Cervical adenopathy 06/16/2018   Jaw deformity 06/16/2018   Prediabetes 08/01/2017   Tingling of both feet 04/18/2016   Osteopenia 01/15/2016   Mild persistent asthma 01/10/2016   Other allergic rhinitis 01/10/2016   Hyperlipidemia 08/13/2008   Hypertension 09/22/2007   Allergic rhinitis 05/26/2007    PCP: Geofm Glade PARAS, MD   REFERRING PROVIDER: Geofm Glade PARAS, MD   REFERRING DIAG: M54.50,G89.29 (ICD-10-CM) - Chronic bilateral low back pain without sciatica  THERAPY DIAG:  Other low back pain  Muscle weakness (generalized)  RATIONALE FOR EVALUATION AND TREATMENT: Rehabilitation  ONSET DATE: chronic, years ago  NEXT MD VISIT:    SUBJECTIVE:  SUBJECTIVE STATEMENT: Pt reports that she feels more pain today R TL area.   68 y/o patient referred to PT for chronic LBP.  States she has had pain for many years in her low back.  Denies any chiropractic or injections.   Has had xrays that showed DDD and facet OA.   She gets pain sitting upright without a back support on her chair.   She cannot tolerate sititng for more than a few minutes on the mat table today for the evaluation visit and has to sit in a chair with a back.   Rolling in bed and any twisting motions are most painful.   Ironing also increases pain.   Bending and doing laundry is ok. Very active, goes to [facility] hospital gym and has a systems analyst.   Walks 2.5 to 5 miles daily.   Does standing back rotation with cable cross over machine and it hurt today.  Does planks and can hold for 1 min with her trainer.   Lives with spouse who is 96 y/o, but she does not have to do any personal care as he is mobile and healthy.    PAIN: Are you having pain? Yes: NPRS scale:  4/10 now;  1/10 best over last week Pain location: central low back Pain description: aching, sore Aggravating factors: rolling over in bed Relieving factors: heat, not rolling in bed  PERTINENT HISTORY:  HTN, hyperlipidemia, osteopenia, prediabetes, CKD 2, R breast intraductal papilloma  PRECAUTIONS: None  RED FLAGS: None  WEIGHT BEARING RESTRICTIONS: No  FALLS:  Has patient fallen in last 6 months? No  LIVING ENVIRONMENT: Lives with: lives with their family and lives with their spouse Lives in: House/apartment Stairs: Yes: External: 1 steps; none Has following equipment at home: None  OCCUPATION: retired surveyor, quantity  PLOF: Independent with gait  PATIENT GOALS: have less back pain and be able to move in bed without pain   OBJECTIVE: (objective measures completed at initial evaluation unless otherwise dated)  DIAGNOSTIC FINDINGS:  EXAM: 4 VIEW(S) XRAY OF THE LUMBAR SPINE 08/04/2024 10:49:47 AM   COMPARISON: None available.   CLINICAL HISTORY: chronic pain w/o radiculopathy. Back pain x several years, recently pain is worse. NKI.   FINDINGS:   LUMBAR SPINE:   BONES: No acute fracture. No aggressive appearing osseous lesion. Alignment is normal.   DISCS AND DEGENERATIVE CHANGES: Mild endplate remodeling is seen throughout the lumbar spine in keeping with changes of mild degenerative disc disease. Facet arthrosis at L5-S1 noted.   SOFT TISSUES: No acute abnormality.   IMPRESSION: 1. Mild degenerative disc disease with endplate remodeling throughout the lumbar spine. 2. Facet arthrosis at L5-S1.   Electronically signed by: Dorethia Molt MD 08/05/2024 09:39 PM EDT RP Workstation: [id number]  EXAM: 3 VIEW(S) XRAY OF THE THORACIC SPINE 08/04/2024 10:49:47 AM   COMPARISON: None available.   CLINICAL HISTORY: chronic pain w/o radiculopathy. Back pain x several years, recently pain is worse. NKI.   FINDINGS:   BONES: No acute  fracture. No aggressive appearing osseous lesion. Alignment is normal.   DISCS AND DEGENERATIVE CHANGES: Mild endplate remodeling within the upper mid thoracic spine in keeping with mild degenerative disc disease.   SOFT TISSUES: The visualized lungs are clear.   IMPRESSION: 1. Mild degenerative disc disease in the mid thoracic spine.   Electronically signed by: Dorethia Molt MD 08/05/2024 09:40 PM EDT RP Workstation: [id number]  PATIENT SURVEYS:  Modified Oswestry:  MODIFIED OSWESTRY DISABILITY SCALE  Date:  08/25/24 Score  Pain intensity 3 =  Pain medication provides me with moderate relief from pain.  2. Personal care (washing, dressing, etc.) 1 =  I can take care of myself normally, but it increases my pain.  3. Lifting 1 = I can lift heavy weights, but it causes increased pain.  4. Walking 0 = Pain does not prevent me from walking any distance  5. Sitting 2 =  Pain prevents me from sitting more than 1 hour.  6. Standing 1 =  I can stand as long as I want but, it increases my pain.  7. Sleeping 3 =  Even when I take pain medication, I sleep less than 4 hours.  8. Social Life 0 = My social life is normal and does not increase my pain.  9. Traveling 2 =  My pain restricts my travel over 2 hours.  10. Employment/ Homemaking 2 = I can perform most of my homemaking/job duties, but pain prevents me from performing more physically stressful activities (eg, lifting, vacuuming).  Total 16/50 = 32%   Interpretation of scores: Score Category Description  0-20% Minimal Disability The patient can cope with most living activities. Usually no treatment is indicated apart from advice on lifting, sitting and exercise  21-40% Moderate Disability The patient experiences more pain and difficulty with sitting, lifting and standing. Travel and social life are more difficult and they may be disabled from work. Personal care, sexual activity and sleeping are not grossly affected, and the patient can  usually be managed by conservative means  41-60% Severe Disability Pain remains the main problem in this group, but activities of daily living are affected. These patients require a detailed investigation  61-80% Crippled Back pain impinges on all aspects of the patients life. Positive intervention is required  81-100% Bed-bound These patients are either bed-bound or exaggerating their symptoms  Bluford FORBES Zoe DELENA Karon DELENA, et al. Surgery versus conservative management of stable thoracolumbar fracture: the PRESTO feasibility RCT. [place] ([place]): [employer]; 2021 Nov. [facility] Technology Assessment, No. 25.62.) Appendix 3, Oswestry Disability Index category descriptors. Available from: [email or web address].cz  Minimally Clinically Important Difference (MCID) = 12.8%  SCREENING FOR RED FLAGS: Bowel or bladder incontinence: No Spinal tumors: No Cauda equina syndrome: No Compression fracture: No Abdominal aneurysm: No  COGNITION:  Overall cognitive status: Within functional limits for tasks assessed    SENSATION: WFL  POSTURE:  Supinated feet, lower R shoulder, leans a little to the R, fairly normal lordosis Supine:  R malleolus is slightly longer, R IC is slightly higher, ASIS appear equal  PALPATION: B paraspinals, L quadratus and L SI, and increased pain with   LUMBAR ROM:   Active  Eval 09/17/24  Flexion Mid shins, no pain Mid shins, no pain  Extension 50%; tight at end range 75%  Right lateral flexion Mid thigh To knee  Left lateral flexion Upper thigh, pain and stiff To knee  Right rotation 50%, stiff, some pain 60%; end range discomfort  Left rotation 75%, min pain 100%, no pain  (Blank rows = not tested)  MUSCLE LENGTH: Hamstrings: Right SLR = 80 deg; Left SLR = 75 deg Thomas test: Right 0 deg; Left 0 deg Hamstrings: tight BLE ITB: Tight LLE Piriformis: mild tight BLE Hip flexors:  mild tight BLE Quads: NT Heelcord: NT  LOWER  EXTREMITY ROM:     (Blank rows = not tested)  LOWER EXTREMITY MMT:   Active  Right eval Left eval  Hip  flexion 5 5  Hip extension 3+ 4-  Hip abduction 5 4-  Hip adduction    Hip internal rotation 4+ 4  Hip external rotation 5 5  Knee flexion 5 4  Knee extension 5 5  Ankle dorsiflexion 5 4  Ankle plantarflexion    Ankle inversion    Ankle eversion     LUMBAR SPECIAL TESTS:  Straight leg raise test: Negative, Slump test: Negative, SI Compression/distraction test: Positive, FABER test: Positive, Gaenslen's test: Negative, and Thomas test: Negative;  FABER is positive on LLE only  FUNCTIONAL TESTS:   Long sit test RLE goes long to short at malleoli;  Standing:  Lower R shoulder;  supinator, L IT was tight, R piriformis causes some back pain with stretch;  R anterior/ L posterior pelvic rotation is a possibility  Long leg traction BLE relieves her back pain considerably  GAIT: Distance walked: into clinic x 150' Assistive device utilized: None Level of assistance: Complete Independence Gait pattern: very hard heel striker with supinatory roll BLE R >L; short quick step gait pattern, minimal trunk rotation Comments:    TODAY'S TREATMENT:  09/21/24 Bike L4x51min QL/lat stretch in doorway 2x30 sec IASTM foam roll to R lats with some manual pressure in L S/L  Seated L mod pigeon + R TL rotation 2x30 sec Standing lat pull downs 7lb x 20 with cable  Standing R shoulder ext 7lb with cable x 12; x 8 with 5lb  09/17/24 THERAPEUTIC EXERCISE: To improve strength and endurance.  Demonstration, verbal and tactile cues throughout for technique. NuStep L5 x 5'  NEUROMUSCULAR RE-EDUCATION: To improve coordination, kinesthesia, posture, and proprioception. 65 cm swiss ball: Bouncing EO x 1'; EC x 1'  Marching alternately x 1'  Marching w/ alternate arm raise x 1'  Alternate knee extension x 1'  F/B rolling x 1' Supine w/ LE's on 55 cm swiss ball at 90/90  B shoulder ER RTB x 10  B  shoulder HABD RTB x 10  B shoulder D2 flexion PNF x 10 each way  THERAPEUTIC ACTIVITIES: To improve functional performance.  Demonstration, verbal and tactile cues throughout for technique. Leg press 55lb x 20 BLE Standing cable rows 20lb x 20 Standing shoulder extension 20lb x 20 R lateral shift w/ RUE wall slide/reach x 10 R QL stretch in doorway x 30 sec Child's pose stretch x 10 sec x 5 Hooklying curl up and reach x 2/10 BUE  Supine = level malleoli BLE Long sit test = no change  09/14/24 Bike L3x45min Leg press 55lb x 20 BLE Standing cable rows 20lb x 20 Standing shoulder extension 20lb x 20 Sidesteps RTB at ankles 2x Standing hip abduction RTB 2x10 Standing hip extension RTB 2x10 Supine LTR both ways, DKTC 09/09/24 THERAPEUTIC EXERCISE: To improve strength and endurance.  Demonstration, verbal and tactile cues throughout for technique. Bike L4 x 6'  THERAPEUTIC ACTIVITIES: To improve functional performance.  Demonstration, verbal and tactile cues throughout for technique. Seated hamstretch x 1' x 2 BLE Supine long sit test is only very slight long to short on the RLE;  malleoli are equal in supine Supine scapular depression BUE Supine pelvic depression w/ arms full flexed and relaxed overhead x 15 each; leg one at a time Supine L hip flexor stretch w/ SKTC on RLE x 1' w/ isometric hip extension on RLE--patient reports gets L foot numbness in this position Supine bridge w/ blue TB to knees x 20 BLE Supine SLR ham stretch by PT  x 1' x 3 RLE Seated swiss ball rollouts x 10 from 11:00, 12:00, 1:00 Standing RUE wall reach w/ trunk SB stretch to the L x 1' x 2   Modified self MET for anterior R inominate w/ R SKTC w/ isometric hip ext  MANUAL THERAPY: To promote reduced pain utilizing joint mobilization. Long leg traction x 10-20 gentle grade 3-4 reps to each leg individually  MODALITIES: Static pelvic traction at neutral pull from 90/90 position at 77 lbs pull for 155 lb  body weight x 5'--tolerates well, feels relief afterwards  09/07/24 NEUROMUSCULAR RE-EDUCATION: To improve coordination, kinesthesia, posture, and proprioception.  Bridges x 30 Supine hip ADD with TRA x 20 Supine hip ABD with TRA x 10 blue TB Bridges blue TB x 20 B hip flexion with ball squeeze x 12 supine MET shotgun technique for pelvic misalignment, RLE was longer after supine to long sit test Able to correct after METs Standing trunk rotation at wall Thread the needle on elevated mat table to L side  THERAPEUTIC EXERCISE: To improve strength, endurance, ROM, and flexibility.  Bike L4x66min LTR both ways x 10 B  09/02/24 NEUROMUSCULAR RE-EDUCATION: To improve coordination, kinesthesia, posture, and proprioception.  Bridge knees bent BOSU ball 10x5  THERAPEUTIC EXERCISE: To improve strength, endurance, ROM, and flexibility.  Bike L4x51min Seated HS stretch BLE x 1 min Supine BLE thomas stretch x 1 min- increased LBP so taken out of HEP Supine KTOS stretch BLE x 1 min Sidelying hip ABD x 20 BLE  MANUAL THERAPY: To promote normalized muscle tension, improve joint mobility and/or for pain modulation  DTM to B thoracic paraspinals in prone IASTM with foam roll to thoracic paraspinals  Moist heat in supine to mid back x 8 min  08/25/24 SELF CARE: Provided education on PT POC progression., initial HEP  MODALITIES:   HP/premod ESTIM to B paraspinals from T8 to PSIS at machine default settings x 12-18 mA intensity x 15' in sitting (declined lying on back)   PATIENT EDUCATION:  Education details: adding L sidebending stretch at wall to HEP  Person educated: Patient Education method: Explanation, Demonstration, Verbal cues, Tactile cues, Handouts, and MedBridgeGO app access provided Education comprehension: verbalized understanding, verbal cues required, tactile cues required, and needs further education  HOME EXERCISE PROGRAM: Access Code: [email or web address] URL:  [email or web address] Date: 09/17/2024 Prepared by: Garnette Montclair  Exercises - Supine Bridge  - 1 x daily - 7 x weekly - 3 sets - 10 reps - Supine Hip Adduction Isometric with Ball  - 1 x daily - 7 x weekly - 3 sets - 10 reps - Hooklying Abduction with Resistance  - 1 x daily - 7 x weekly - 3 sets - 10 reps - Plank with Thoracic Rotation on Counter  - 1 x daily - 7 x weekly - 3 sets - 10 reps - Standing Quadratus Lumborum Stretch with Doorway  - 1 x daily - 7 x weekly - 1 sets - 2 reps - 1 min hold - Supine Shoulder External Rotation on Foam Roll with Theraband  - 1 x daily - 7 x weekly - 1 sets - 10 reps - Supine Shoulder External Rotation with Resistance  - 1 x daily - 7 x weekly - 1 sets - 10 reps - Supine Shoulder Horizontal Abduction with Resistance  - 1 x daily - 7 x weekly - 1 sets - 10 reps - Supine PNF D2 Flexion with Resistance  - 1 x daily - 7 x weekly -  1 sets - 10 reps - Curl Up with Reach  - 1 x daily - 7 x weekly - 2 sets - 10 reps - Side Stepping with Resistance at Thighs  - 1 x daily - 7 x weekly - 3 sets - 10 reps ASSESSMENT:  CLINICAL IMPRESSION: Pt presents with what seems like R latissimus tightness, which was well addressed with MT and stretching/strengthening. Suggested she continue with stretching for lats in doorway as provided last visit. PT remains necessary for ROM  Azaleah Usman is a 68 y.o. female who was referred to physical therapy for evaluation and treatment for LBP.   Patient reports onset of LBP pain beginning years ago. Pain is worse with rolling over in bed or twisting in any position.   Xrays showed DDD and facet OA.  She goes to the gym and works with a systems analyst.   She has very good leg strength with the exception of some L hip weakness.    However, she is very stiff and has some postural deficits.  L5 P/A glides are painful as well as rotational glides in each direction but particularly to the L.  Patient has deficits  in lumbar ROM, lumbar/B LE flexibility, L hamstring, hip abductor, and extensor strength, abnormal posture, and TTP with abnormal muscle tension and pain which are interfering with ADLs and are impacting quality of life.  On Modified Oswestry patient scored 16/50 demonstrating 32% or moderate disability.  Grissel will benefit from skilled PT to address above deficits to improve mobility and activity tolerance with decreased pain interference.     OBJECTIVE IMPAIRMENTS: difficulty walking, decreased ROM, decreased strength, impaired flexibility, postural dysfunction, and pain.   ACTIVITY LIMITATIONS: lifting, bending, standing, sleeping, and locomotion level  PARTICIPATION LIMITATIONS: cleaning, laundry, and community activity  PERSONAL FACTORS: Age, Time since onset of injury/illness/exacerbation, and 1-2 comorbidities: HTN, hyperlipidemia, osteopenia, prediabetes, CKD 2, R breast intraductal papilloma are also affecting patient's functional outcome.   REHAB POTENTIAL: Good  CLINICAL DECISION MAKING: Evolving/moderate complexity  EVALUATION COMPLEXITY: Moderate   GOALS: Goals reviewed with patient? Yes  SHORT TERM GOALS: Target date: 09/22/2024   Patient will be independent with initial HEP to improve outcomes and carryover.  Baseline: 100% PT assist required for correct completion 09/09/24:  can teach back Goal status: MET  2.  Patient will report 25% improvement in low back pain to improve QOL. Baseline: 8/10 worst 09/09/24: 2/10 today Goal status: MET-  3.  Patient will improve lumbar extension and sidebending to 75% ROM in all planes Baseline: 09/17/24 updated Goal status: MET   LONG TERM GOALS: Target date: 10/20/2024   Patient will be independent with ongoing/advanced HEP for self-management at home.  Baseline: no advanced HEP yet Goal status: INITIAL  2.  Patient will report 50-75% improvement in low back pain to improve QOL.  Baseline: 8/10 Goal status: IN  PROGRESS  3.  Patient to demonstrate ability to achieve and maintain good spinal alignment/posturing and body mechanics needed for daily activities. Baseline: some R lateral lean/list Goal status: INITIAL  4.  Patient will demonstrate full pain free lumbar ROM to perform ADLs.   Baseline: Refer to above lumbar ROM table 09/17/24:  see updated tables above Goal status: IN PROGRESS  5.  Patient will demonstrate improved BLE strength to >/= 5/5 for improved stability and ease of mobility. Baseline: Refer to above LE MMT table Goal status: INITIAL  6. Patient will report </= 20% on Modified Oswestry (MCID = 12%)  to demonstrate improved functional ability with decreased pain interference. Baseline: 32% Goal status: INITIAL  7.  Patient will tolerate 30 min of (standing/sitting/walking) and being in any position in bed w/o increased pain to allow for  improved mobility and activity tolerance. Baseline: pain with twisting in any position, pain sitting unsupported without back support Goal status: INITIAL    PLAN:  PT FREQUENCY: 1-2x/week  PT DURATION: 8 weeks  PLANNED INTERVENTIONS: 97164- PT Re-evaluation, 97750- Physical Performance Testing, 97110-Therapeutic exercises, 97530- Therapeutic activity, [id number]- Neuromuscular re-education, 97535- Self Care, 02859- Manual therapy, G0283- Electrical stimulation (unattended), [id number]- Ultrasound, 02987- Traction (mechanical), [id number]- Ionotophoresis 4mg /ml Dexamethasone , 79439 (1-2 muscles), 20561 (3+ muscles)- Dry Needling, Patient/Family education, Taping, Joint mobilization, Spinal mobilization, Cryotherapy, and Moist heat  PLAN FOR NEXT SESSION:  See how new exercises are doing, review HEP for new additions, recheck strength, continue w/ MFR vs modalities PRN  Zarah Carbon L Yvetta Drotar, PTA 09/21/2024, 12:11 PM

## 2024-09-22 ENCOUNTER — Ambulatory Visit: Admitting: Family Medicine

## 2024-09-22 ENCOUNTER — Other Ambulatory Visit: Payer: Self-pay

## 2024-09-22 ENCOUNTER — Encounter: Payer: Self-pay | Admitting: Family Medicine

## 2024-09-22 VITALS — BP 128/64 | HR 81 | Temp 98.1°F | Resp 20 | Ht 62.0 in | Wt 156.5 lb

## 2024-09-22 DIAGNOSIS — J3089 Other allergic rhinitis: Secondary | ICD-10-CM | POA: Diagnosis not present

## 2024-09-22 DIAGNOSIS — K219 Gastro-esophageal reflux disease without esophagitis: Secondary | ICD-10-CM

## 2024-09-22 DIAGNOSIS — J4541 Moderate persistent asthma with (acute) exacerbation: Secondary | ICD-10-CM | POA: Diagnosis not present

## 2024-09-22 MED ORDER — PREDNISONE 10 MG PO TABS: ORAL_TABLET | ORAL | 0 refills | Status: AC

## 2024-09-22 NOTE — Patient Instructions (Signed)
 Asthma- not well controlled Begin prednisone  10 mg tablets. Take 2 tablets twice a day for 3 days, then take 2 tablets once a day for 1 day, then take 1 tablet on the 5th day, then stop  Continue albuterol  2 puffs once every 4 hours as needed You may use albuterol  2 puffs 5-15 minutes before activity to decrease cough or wheeze Continue Breztri  taking 2 puffs twice a day with spacer. Rinse mouth out after.  Samples of and along with a spacer  Asthma control goals:  Full participation in all desired activities (may need albuterol  before activity) Albuterol  use two time or less a week on average (not counting use with activity) Cough interfering with sleep two time or less a month Oral steroids no more than once a year No hospitalizations   Allergic rhinitis Contiue Xyzal  (levocetirizine) 2.5-5 mg once a day as needed for runny nose/itching Begin Flonase  2 sprays in each nostril once a day for a stuffy nose Continue ipratroprium nasal spray 2 sprays in each nostril twice a day as needed for a runny nose.  Try using this nasal spray more consistently to see if this helps with the drainage down your throat.  Caution as this can dry you out. Consider saline nasal rinses as needed for nasal symptoms. Use this before any medicated nasal sprays for best result Consider nasal saline gel if needed for dry nostrils Consider updating your environmental allergy testing.  Remember to stop your antihistamines for 3 days before your testing appointment  Reflux Continue dietary and lifestyle modifications  Increase omeprazole 20 mg to twice a day for the next 30 days. Note any decrease in your cough Continue follow-up with your GI specialist as recommended  Call the clinic if this treatment plan is not working well for you  Follow up in 3 months or sooner if needed.

## 2024-09-22 NOTE — Progress Notes (Signed)
 [address] [place] [address] Dept: [phone]  FOLLOW UP NOTE  Patient ID: Samantha Shepard, female    DOB: 01/06/1956  Age: 68 y.o. MRN: [id number] Date of Office Visit: 09/22/2024  Assessment  Chief Complaint: Follow-up (I month follow up changed from breo to breztri  not always cpompliant due to dry mouth)  HPI Samantha Shepard is a 68 year old female who presents to the clinic for follow-up visit.  She was last seen in this clinic on 08/21/2024 by Wanda Craze, FNP, for evaluation of asthma, allergic rhinitis, and reflux.  Discussed the use of AI scribe software for clinical note transcription with the patient, who gave verbal consent to proceed.  History of Present Illness Samantha Shepard is a 68 year old female who presents with a persistent cough and nasal drainage.   She has a persistent cough that has been ongoing for about 30-40 years, described as 'cough, cough, cough all the time.' The cough is sometimes accompanied by a sensation of mucus being 'hung in my throat.' She uses ipratropium nasal spray, which gives her a 'horrible taste' and dries her mouth out.   She reports her asthma has been moderately well-controlled with occasional shortness of breath especially with activity and cough producing thick mucus that is hard to move out of her throat.  She continues Breztri  only as needed and occasionally uses albuterol  for relief of symptoms.  She does report significant relief of symptoms while continuing on Breztri .    Allergic rhinitis is reported as moderately well-controlled with occasional clear rhinorrhea, nasal congestion, and copious thick postnasal drainage.  She reports his nasal drainage is not seasonal and occurs most of the year.  She continues Xyzal  5 mg once a day and ipratropium at least daily.  She occasionally uses Flonase  nasal spray and is not currently using nasal saline rinses.  She continues nasal saline gel as needed  with some relief of nasal dryness.   She has a history of reflux and is currently taking omeprazole once daily. She has not tried taking it twice a day and is unsure if her reflux is contributing to her symptoms. Her symptoms, including the cough and nasal drainage, have not been fully resolved by any treatments she has tried. She is scheduled to see Dr. Candi, GI specialist, on 11/09/2024.   Her current medications are listed in the chart.   Drug Allergies:  Allergies[1]  Physical Exam: BP 128/64   Pulse 81   Temp 98.1 F (36.7 C) (Oral)   Resp 20   Ht 5' 2 (1.575 m)   Wt 156 lb 8 oz (71 kg)   SpO2 99%   BMI 28.62 kg/m    Physical Exam Vitals reviewed.  Constitutional:      Appearance: Normal appearance.  HENT:     Head: Normocephalic and atraumatic.     Right Ear: Tympanic membrane normal.     Left Ear: Tympanic membrane normal.     Nose:     Comments: Bilateral nares slightly erythematous with thin clear nasal drainage noted. Pharynx slightly erythematous with no exudate. Ears normal. Eyes normal.    Mouth/Throat:     Pharynx: Oropharynx is clear.  Eyes:     Conjunctiva/sclera: Conjunctivae normal.  Cardiovascular:     Rate and Rhythm: Normal rate and regular rhythm.     Heart sounds: Normal heart sounds. No murmur heard. Pulmonary:     Effort: Pulmonary effort is normal.     Breath  sounds: Normal breath sounds.     Comments: Lungs clear to auscultation Musculoskeletal:        General: Normal range of motion.     Cervical back: Normal range of motion and neck supple.  Skin:    General: Skin is warm and dry.  Neurological:     Mental Status: She is alert and oriented to person, place, and time.  Psychiatric:        Mood and Affect: Mood normal.        Behavior: Behavior normal.        Thought Content: Thought content normal.        Judgment: Judgment normal.     Diagnostics: FVC 1.89 which is 71% of predicted value, FEV1 1.61 which is 78% of predicted value.   Spirometry indicates normal ventilatory function with reduced FVC.  This is consistent with previous spirometry readings.  Assessment and Plan: 1. Not well controlled moderate persistent asthma with acute exacerbation   2. Other allergic rhinitis   3. Gastroesophageal reflux disease, unspecified whether esophagitis present     Meds ordered this encounter  Medications   predniSONE  (DELTASONE ) 10 MG tablet    Sig: Begin prednisone  10 mg tablets. Take 2 tablets twice a day for 3 days, then take 2 tablets once a day for 1 day, then take 1 tablet on the 5th day, then stop    Dispense:  15 tablet    Refill:  0    Patient Instructions  Asthma- not well controlled Begin prednisone  10 mg tablets. Take 2 tablets twice a day for 3 days, then take 2 tablets once a day for 1 day, then take 1 tablet on the 5th day, then stop  Continue albuterol  2 puffs once every 4 hours as needed You may use albuterol  2 puffs 5-15 minutes before activity to decrease cough or wheeze Continue Breztri  taking 2 puffs twice a day with spacer. Rinse mouth out after.  Samples of and along with a spacer  Asthma control goals:  Full participation in all desired activities (may need albuterol  before activity) Albuterol  use two time or less a week on average (not counting use with activity) Cough interfering with sleep two time or less a month Oral steroids no more than once a year No hospitalizations   Allergic rhinitis Contiue Xyzal  (levocetirizine) 2.5-5 mg once a day as needed for runny nose/itching Begin Flonase  2 sprays in each nostril once a day for a stuffy nose Continue ipratroprium nasal spray 2 sprays in each nostril twice a day as needed for a runny nose.  Try using this nasal spray more consistently to see if this helps with the drainage down your throat.  Caution as this can dry you out. Consider saline nasal rinses as needed for nasal symptoms. Use this before any medicated nasal sprays for best  result Consider nasal saline gel if needed for dry nostrils Consider updating your environmental allergy testing.  Remember to stop your antihistamines for 3 days before your testing appointment  Reflux Continue dietary and lifestyle modifications  Increase omeprazole 20 mg to twice a day for the next 30 days. Note any decrease in your cough Continue follow-up with your GI specialist as recommended  Call the clinic if this treatment plan is not working well for you  Follow up in 3 months or sooner if needed.   Return in about 3 months (around 12/21/2024), or if symptoms worsen or fail to improve.    Thank you for  the opportunity to care for this patient.  Please do not hesitate to contact me with questions.  Arlean Mutter, FNP [facility]          [1]  Allergies Allergen Reactions   Sulfonamide Derivatives     REACTION: rash   Codeine     REACTION: Excitability--can't sleep   Augmentin  [Amoxicillin -Pot Clavulanate] Diarrhea

## 2024-09-24 ENCOUNTER — Ambulatory Visit: Admitting: Rehabilitation

## 2024-10-12 ENCOUNTER — Ambulatory Visit: Attending: Internal Medicine | Admitting: Rehabilitation

## 2024-10-12 ENCOUNTER — Encounter: Payer: Self-pay | Admitting: Rehabilitation

## 2024-10-12 DIAGNOSIS — M5459 Other low back pain: Secondary | ICD-10-CM | POA: Insufficient documentation

## 2024-10-12 DIAGNOSIS — M6281 Muscle weakness (generalized): Secondary | ICD-10-CM | POA: Diagnosis present

## 2024-10-12 NOTE — Therapy (Signed)
 " OUTPATIENT PHYSICAL THERAPY THORACOLUMBAR TREATMENT /RECERTIFICATION    Patient Name: Samantha Shepard MRN: [id number] DOB:31-May-1956, 69 y.o., female Today's Date: 10/12/2024  END OF SESSION:  PT End of Session - 10/12/24 1147     Visit Number 8    Date for Recertification  10/20/24    PT Start Time 1144    PT Stop Time 1233    PT Time Calculation (min) 49 min    Activity Tolerance Patient tolerated treatment well;No increased pain    Behavior During Therapy WFL for tasks assessed/performed            Past Medical History:  Diagnosis Date   Allergy    dust, cats   Anxiety    Asthma    controlled   Hyperlipidemia    Hypertension    Left breast mass    Migraines    Past Surgical History:  Procedure Laterality Date   BREAST BIOPSY  10/17/2022   MM RT RADIOACTIVE SEED LOC MAMMO GUIDE 10/17/2022 [facility] MAMMOGRAPHY   BREAST EXCISIONAL BIOPSY Left 06/2019   benign   BREAST LUMPECTOMY WITH RADIOACTIVE SEED LOCALIZATION Left 07/08/2019   Procedure: LEFT BREAST LUMPECTOMY WITH RADIOACTIVE SEED LOCALIZATION;  Surgeon: Curvin Deward MOULD, MD;  Location: [facility];  Service: General;  Laterality: Left;   BREAST LUMPECTOMY WITH RADIOACTIVE SEED LOCALIZATION Right 10/18/2022   Procedure: RIGHT BREAST LUMPECTOMY WITH RADIOACTIVE SEED LOCALIZATION;  Surgeon: Curvin Deward III, MD;  Location: [facility];  Service: General;  Laterality: Right;   COLONOSCOPY  2009   neg; [facility]   thyroid  cyst aspirated     TONSILLECTOMY     Patient Active Problem List   Diagnosis Date Noted   Chronic lower back pain 08/04/2024   CKD (chronic kidney disease) stage 2, GFR 60-89 ml/min 02/03/2024   Not well controlled moderate persistent asthma 09/18/2022   Intraductal papilloma of right breast 08/20/2022   Agatston coronary artery calcium score of 1,   03/2022 03/17/2022   Gastroesophageal reflux disease 08/31/2020   Cough 08/31/2020   Sleep difficulties  07/29/2020   Temporomandibular joint (TMJ) pain 06/16/2018   Anxiety 06/16/2018   Cervical adenopathy 06/16/2018   Jaw deformity 06/16/2018   Prediabetes 08/01/2017   Tingling of both feet 04/18/2016   Osteopenia 01/15/2016   Mild persistent asthma 01/10/2016   Other allergic rhinitis 01/10/2016   Hyperlipidemia 08/13/2008   Hypertension 09/22/2007   Allergic rhinitis 05/26/2007    PCP: Geofm Glade PARAS, MD   REFERRING PROVIDER: Geofm Glade PARAS, MD   REFERRING DIAG: M54.50,G89.29 (ICD-10-CM) - Chronic bilateral low back pain without sciatica  THERAPY DIAG:  Other low back pain - Plan: PT plan of care cert/re-cert  Muscle weakness (generalized) - Plan: PT plan of care cert/re-cert  RATIONALE FOR EVALUATION AND TREATMENT: Rehabilitation  ONSET DATE: chronic, years ago  NEXT MD VISIT:    SUBJECTIVE:  SUBJECTIVE STATEMENT: Pt reports 1/10 now;   Worst over the last week was 7/10.   Relates that pain is more to the R side  69 y/o patient referred to PT for chronic LBP.  States she has had pain for many years in her low back.  Denies any chiropractic or injections.   Has had xrays that showed DDD and facet OA.   She gets pain sitting upright without a back support on her chair.   She cannot tolerate sititng for more than a few minutes on the mat table today for the evaluation visit and has to sit in a chair with a back.   Rolling in bed and any twisting motions are most painful.   Ironing also increases pain.   Bending and doing laundry is ok. Very active, goes to [facility] gym and has a systems analyst.   Walks 2.5 to 5 miles daily.   Does standing back rotation with cable cross over machine and it hurt today.  Does planks and can hold for 1 min with her trainer.   Lives with spouse who  is 60 y/o, but she does not have to do any personal care as he is mobile and healthy.    PAIN: Are you having pain? Yes: NPRS scale: 4/10 now;  1/10 best over last week Pain location: central low back Pain description: aching, sore Aggravating factors: rolling over in bed Relieving factors: heat, not rolling in bed  PERTINENT HISTORY:  HTN, hyperlipidemia, osteopenia, prediabetes, CKD 2, R breast intraductal papilloma  PRECAUTIONS: None  RED FLAGS: None  WEIGHT BEARING RESTRICTIONS: No  FALLS:  Has patient fallen in last 6 months? No  LIVING ENVIRONMENT: Lives with: lives with their family and lives with their spouse Lives in: House/apartment Stairs: Yes: External: 1 steps; none Has following equipment at home: None  OCCUPATION: retired surveyor, quantity  PLOF: Independent with gait  PATIENT GOALS: have less back pain and be able to move in bed without pain   OBJECTIVE: (objective measures completed at initial evaluation unless otherwise dated)  DIAGNOSTIC FINDINGS:  EXAM: 4 VIEW(S) XRAY OF THE LUMBAR SPINE 08/04/2024 10:49:47 AM   COMPARISON: None available.   CLINICAL HISTORY: chronic pain w/o radiculopathy. Back pain x several years, recently pain is worse. NKI.   FINDINGS:   LUMBAR SPINE:   BONES: No acute fracture. No aggressive appearing osseous lesion. Alignment is normal.   DISCS AND DEGENERATIVE CHANGES: Mild endplate remodeling is seen throughout the lumbar spine in keeping with changes of mild degenerative disc disease. Facet arthrosis at L5-S1 noted.   SOFT TISSUES: No acute abnormality.   IMPRESSION: 1. Mild degenerative disc disease with endplate remodeling throughout the lumbar spine. 2. Facet arthrosis at L5-S1.   Electronically signed by: Dorethia Molt MD 08/05/2024 09:39 PM EDT RP Workstation: [id number]  EXAM: 3 VIEW(S) XRAY OF THE THORACIC SPINE 08/04/2024 10:49:47 AM   COMPARISON: None available.   CLINICAL  HISTORY: chronic pain w/o radiculopathy. Back pain x several years, recently pain is worse. NKI.   FINDINGS:   BONES: No acute fracture. No aggressive appearing osseous lesion. Alignment is normal.   DISCS AND DEGENERATIVE CHANGES: Mild endplate remodeling within the upper mid thoracic spine in keeping with mild degenerative disc disease.   SOFT TISSUES: The visualized lungs are clear.   IMPRESSION: 1. Mild degenerative disc disease in the mid thoracic spine.   Electronically signed by: Dorethia Molt MD 08/05/2024 09:40 PM EDT RP Workstation: [id number]  PATIENT SURVEYS:  Modified Oswestry:  MODIFIED OSWESTRY DISABILITY SCALE 10/12/24  Date: 08/25/24 Score   Pain intensity 3 =  Pain medication provides me with moderate relief from pain. 2  2. Personal care (washing, dressing, etc.) 1 =  I can take care of myself normally, but it increases my pain. 0  3. Lifting 1 = I can lift heavy weights, but it causes increased pain. 0  4. Walking 0 = Pain does not prevent me from walking any distance 1  5. Sitting 2 =  Pain prevents me from sitting more than 1 hour. 3  6. Standing 1 =  I can stand as long as I want but, it increases my pain. 3  7. Sleeping 3 =  Even when I take pain medication, I sleep less than 4 hours. 3  8. Social Life 0 = My social life is normal and does not increase my pain. 1  9. Traveling 2 =  My pain restricts my travel over 2 hours. 1  10. Employment/ Homemaking 2 = I can perform most of my homemaking/job duties, but pain prevents me from performing more physically stressful activities (eg, lifting, vacuuming). 1  Total 16/50 = 32% 15/50 = 30%   Interpretation of scores: Score Category Description  0-20% Minimal Disability The patient can cope with most living activities. Usually no treatment is indicated apart from advice on lifting, sitting and exercise  21-40% Moderate Disability The patient experiences more pain and difficulty with sitting, lifting and  standing. Travel and social life are more difficult and they may be disabled from work. Personal care, sexual activity and sleeping are not grossly affected, and the patient can usually be managed by conservative means  41-60% Severe Disability Pain remains the main problem in this group, but activities of daily living are affected. These patients require a detailed investigation  61-80% Crippled Back pain impinges on all aspects of the patients life. Positive intervention is required  81-100% Bed-bound These patients are either bed-bound or exaggerating their symptoms  Bluford FORBES Zoe DELENA Karon DELENA, et al. Surgery versus conservative management of stable thoracolumbar fracture: the PRESTO feasibility RCT. [place] ([place]): [employer]; 2021 Nov. [facility] Technology Assessment, No. 25.62.) Appendix 3, Oswestry Disability Index category descriptors. Available from: Findjewelers.cz  Minimally Clinically Important Difference (MCID) = 12.8%  SCREENING FOR RED FLAGS: Bowel or bladder incontinence: No Spinal tumors: No Cauda equina syndrome: No Compression fracture: No Abdominal aneurysm: No  COGNITION:  Overall cognitive status: Within functional limits for tasks assessed    SENSATION: WFL  POSTURE:  Supinated feet, lower R shoulder, leans a little to the R, fairly normal lordosis Supine:  R malleolus is slightly longer, R IC is slightly higher, ASIS appear equal  PALPATION: B paraspinals, L quadratus and L SI, and increased pain with   LUMBAR ROM:   Active  Eval 09/17/24 10/12/24  Flexion Mid shins, no pain Mid shins, no pain To ankles  Extension 50%; tight at end range 75% 85%  Right lateral flexion Mid thigh To knee To mid thigh  Left lateral flexion Upper thigh, pain and stiff To knee To knee  Right rotation 50%, stiff, some pain 60%; end range discomfort 75%  Left rotation 75%, min pain 100%, no pain 100%  (Blank rows = not tested)  MUSCLE  LENGTH: Hamstrings: Right SLR = 80 deg; Left SLR = 75 deg Thomas test: Right 0 deg; Left 0 deg Hamstrings: tight BLE ITB: Tight LLE Piriformis: mild tight  BLE Hip flexors:  mild tight BLE Quads: NT Heelcord: NT  LOWER EXTREMITY ROM:     (Blank rows = not tested)  LOWER EXTREMITY MMT:   Active  Right eval Left eval R/L 10/12/24  Hip flexion 5 5 5/5  Hip extension 3+ 4- 4-/4-  Hip abduction 5 4- 5/4  Hip adduction     Hip internal rotation 4+ 4 5/4+  Hip external rotation 5 5 5/5  Knee flexion 5 4 4+/4+  Knee extension 5 5 5/5  Ankle dorsiflexion 5 4 5/4+  Ankle plantarflexion   5/5  Ankle inversion     Ankle eversion      LUMBAR SPECIAL TESTS:  Straight leg raise test: Negative, Slump test: Negative, SI Compression/distraction test: Positive, FABER test: Positive, Gaenslen's test: Negative, and Thomas test: Negative;  FABER is positive on LLE only  FUNCTIONAL TESTS:   Long sit test RLE goes long to short at malleoli;  Standing:  Lower R shoulder;  supinator, L IT was tight, R piriformis causes some back pain with stretch;  R anterior/ L posterior pelvic rotation is a possibility  Long leg traction BLE relieves her back pain considerably  GAIT: Distance walked: into clinic x 150' Assistive device utilized: None Level of assistance: Complete Independence Gait pattern: very hard heel striker with supinatory roll BLE R >L; short quick step gait pattern, minimal trunk rotation Comments:    TODAY'S TREATMENT:  10/12/24 THERAPEUTIC EXERCISE: To improve strength and endurance.  Demonstration, verbal and tactile cues throughout for technique. Bike L4 x 6'  THERAPEUTIC ACTIVITIES: To improve functional performance.  Demonstration, verbal and tactile cues throughout for technique. SLR hamstretch x 1' x 3 BLE Supine KTOS piriformis stretch x 1' x 3 BLE Supine hip flexor stretch x 1' x 2 BLE Seated FABER piriformis stretch x 1' BLE Prayer stretch w/ 10 sec holds x 6 Standing  calf stretch on 1/2 foam roller x 1';   Soleus x 1' BLE Sidebending latissimus/QL stretch holding doorframe BUE x 20 sec x 2 each side Prone hip extension w/ TA x 2/10 BLE Counter plank w/ TA and thoracic rotation/shoulder HABD x 10 each side Rechecked strength and ROM  09/21/24 Bike L4x1min QL/lat stretch in doorway 2x30 sec IASTM foam roll to R lats with some manual pressure in L S/L  Seated L mod pigeon + R TL rotation 2x30 sec Standing lat pull downs 7lb x 20 with cable  Standing R shoulder ext 7lb with cable x 12; x 8 with 5lb  09/17/24 THERAPEUTIC EXERCISE: To improve strength and endurance.  Demonstration, verbal and tactile cues throughout for technique. NuStep L5 x 5'  NEUROMUSCULAR RE-EDUCATION: To improve coordination, kinesthesia, posture, and proprioception. 65 cm swiss ball: Bouncing EO x 1'; EC x 1'  Marching alternately x 1'  Marching w/ alternate arm raise x 1'  Alternate knee extension x 1'  F/B rolling x 1' Supine w/ LE's on 55 cm swiss ball at 90/90  B shoulder ER RTB x 10  B shoulder HABD RTB x 10  B shoulder D2 flexion PNF x 10 each way  THERAPEUTIC ACTIVITIES: To improve functional performance.  Demonstration, verbal and tactile cues throughout for technique. Leg press 55lb x 20 BLE Standing cable rows 20lb x 20 Standing shoulder extension 20lb x 20 R lateral shift w/ RUE wall slide/reach x 10 R QL stretch in doorway x 30 sec Child's pose stretch x 10 sec x 5 Hooklying curl up and reach x 2/10  BUE  Supine = level malleoli BLE Long sit test = no change  09/14/24 Bike L3x60min Leg press 55lb x 20 BLE Standing cable rows 20lb x 20 Standing shoulder extension 20lb x 20 Sidesteps RTB at ankles 2x Standing hip abduction RTB 2x10 Standing hip extension RTB 2x10 Supine LTR both ways, DKTC 09/09/24 THERAPEUTIC EXERCISE: To improve strength and endurance.  Demonstration, verbal and tactile cues throughout for technique. Bike L4 x 6'  THERAPEUTIC  ACTIVITIES: To improve functional performance.  Demonstration, verbal and tactile cues throughout for technique. Seated hamstretch x 1' x 2 BLE Supine long sit test is only very slight long to short on the RLE;  malleoli are equal in supine Supine scapular depression BUE Supine pelvic depression w/ arms full flexed and relaxed overhead x 15 each; leg one at a time Supine L hip flexor stretch w/ SKTC on RLE x 1' w/ isometric hip extension on RLE--patient reports gets L foot numbness in this position Supine bridge w/ blue TB to knees x 20 BLE Supine SLR ham stretch by PT x 1' x 3 RLE Seated swiss ball rollouts x 10 from 11:00, 12:00, 1:00 Standing RUE wall reach w/ trunk SB stretch to the L x 1' x 2   Modified self MET for anterior R inominate w/ R SKTC w/ isometric hip ext  MANUAL THERAPY: To promote reduced pain utilizing joint mobilization. Long leg traction x 10-20 gentle grade 3-4 reps to each leg individually  MODALITIES: Static pelvic traction at neutral pull from 90/90 position at 77 lbs pull for 155 lb body weight x 5'--tolerates well, feels relief afterwards  09/07/24 NEUROMUSCULAR RE-EDUCATION: To improve coordination, kinesthesia, posture, and proprioception.  Bridges x 30 Supine hip ADD with TRA x 20 Supine hip ABD with TRA x 10 blue TB Bridges blue TB x 20 B hip flexion with ball squeeze x 12 supine MET shotgun technique for pelvic misalignment, RLE was longer after supine to long sit test Able to correct after METs Standing trunk rotation at wall Thread the needle on elevated mat table to L side  THERAPEUTIC EXERCISE: To improve strength, endurance, ROM, and flexibility.  Bike L4x69min LTR both ways x 10 B  09/02/24 NEUROMUSCULAR RE-EDUCATION: To improve coordination, kinesthesia, posture, and proprioception.  Bridge knees bent BOSU ball 10x5  THERAPEUTIC EXERCISE: To improve strength, endurance, ROM, and flexibility.  Bike L4x42min Seated HS stretch BLE x 1  min Supine BLE thomas stretch x 1 min- increased LBP so taken out of HEP Supine KTOS stretch BLE x 1 min Sidelying hip ABD x 20 BLE  MANUAL THERAPY: To promote normalized muscle tension, improve joint mobility and/or for pain modulation  DTM to B thoracic paraspinals in prone IASTM with foam roll to thoracic paraspinals  Moist heat in supine to mid back x 8 min  08/25/24 SELF CARE: Provided education on PT POC progression., initial HEP  MODALITIES:   HP/premod ESTIM to B paraspinals from T8 to PSIS at machine default settings x 12-18 mA intensity x 15' in sitting (declined lying on back)   PATIENT EDUCATION:  Education details: adding L sidebending stretch at wall to HEP  Person educated: Patient Education method: Explanation, Demonstration, Verbal cues, Tactile cues, Handouts, and MedBridgeGO app access provided Education comprehension: verbalized understanding, verbal cues required, tactile cues required, and needs further education  HOME EXERCISE PROGRAM: ASSESSMENT:  Access Code: [email or web address] URL: [email or web address] Date: 10/12/2024 Prepared by: Garnette Montclair  Exercises - Supine Bridge  - 1 x daily -  7 x weekly - 3 sets - 10 reps - Hooklying Abduction with Resistance  - 1 x daily - 7 x weekly - 3 sets - 10 reps - Plank with Thoracic Rotation on Counter  - 1 x daily - 7 x weekly - 3 sets - 10 reps - Supine Shoulder External Rotation on Foam Roll with Theraband  - 1 x daily - 7 x weekly - 1 sets - 10 reps - Supine Shoulder External Rotation with Resistance  - 1 x daily - 7 x weekly - 1 sets - 10 reps - Supine Shoulder Horizontal Abduction with Resistance  - 1 x daily - 7 x weekly - 1 sets - 10 reps - Supine PNF D2 Flexion with Resistance  - 1 x daily - 7 x weekly - 1 sets - 10 reps - Curl Up with Reach  - 1 x daily - 7 x weekly - 2 sets - 10 reps - Child's Pose Stretch  - 1 x daily - 7 x weekly - 1 sets - 5 reps - 10 sec hold - Standing Quadratus Lumborum  Stretch with Doorway  - 1 x daily - 7 x weekly - 1 sets - 2 reps - 1 min hold - Standing Gastroc Stretch on Step  - 1 x daily - 7 x weekly - 1 sets - 2 reps - 1 min hold - Seated Hamstring Stretch  - 1 x daily - 7 x weekly - 1 sets - 2 reps - 1 min hold - Seated Table Hamstring Stretch  - 1 x daily - 7 x weekly - 3 sets - 10 reps - Seated Figure 4 Piriformis Stretch  - 1 x daily - 7 x weekly - 1 sets - 2 reps - 1 min hold - Supine Piriformis Stretch with Foot on Ground  - 1 x daily - 7 x weekly - 1 sets - 2 reps   CLINICAL IMPRESSION: RECERTIFICATION NOTE:   Patient has been seen x 8 PT visits over the last 2 months for LBP.   She has made good progress, but has not yet attained the goals that we have set for her.  However, she has good rehab potential to meet her goals.   She has been busy over the holidays and hasn't had as much time for her home exercises, so last week or 2 have been a little more painful for her.   However her strength has improved by 1/2 muscle grade on manual muscle testing to BLE.   Lumbar ROM has also improved modestly, but remains restricted with R sidebending and rotation.  She needs to focus a lot on her flexibility which is fairly restricted as well as abdominal, back and hip extensor strengthening.  She tends to fall into a lot of excessive lumbar lordosis with stabilization exercises. PT remains necessary for ROM, strength, functional stability, pain, and HEP deficits.  Continue per POC  EVAL:  Samantha Shepard is a 69 y.o. female who was referred to physical therapy for evaluation and treatment for LBP.   Patient reports onset of LBP pain beginning years ago. Pain is worse with rolling over in bed or twisting in any position.   Xrays showed DDD and facet OA.  She goes to the gym and works with a systems analyst.   She has very good leg strength with the exception of some L hip weakness.    However, she is very stiff and has some postural deficits.  L5 P/A  glides  are painful as well as rotational glides in each direction but particularly to the L.  Patient has deficits in lumbar ROM, lumbar/B LE flexibility, L hamstring, hip abductor, and extensor strength, abnormal posture, and TTP with abnormal muscle tension and pain which are interfering with ADLs and are impacting quality of life.  On Modified Oswestry patient scored 16/50 demonstrating 32% or moderate disability.  Samantha Shepard will benefit from skilled PT to address above deficits to improve mobility and activity tolerance with decreased pain interference.     OBJECTIVE IMPAIRMENTS: difficulty walking, decreased ROM, decreased strength, impaired flexibility, postural dysfunction, and pain.   ACTIVITY LIMITATIONS: lifting, bending, standing, sleeping, and locomotion level  PARTICIPATION LIMITATIONS: cleaning, laundry, and community activity  PERSONAL FACTORS: Age, Time since onset of injury/illness/exacerbation, and 1-2 comorbidities: HTN, hyperlipidemia, osteopenia, prediabetes, CKD 2, R breast intraductal papilloma are also affecting patient's functional outcome.   REHAB POTENTIAL: Good  CLINICAL DECISION MAKING: Evolving/moderate complexity  EVALUATION COMPLEXITY: Moderate   GOALS: Goals reviewed with patient? Yes  SHORT TERM GOALS: Target date: 11/09/2024   Patient will be independent with initial HEP to improve outcomes and carryover.  Baseline: 100% PT assist required for correct completion 09/09/24:  can teach back Goal status: MET  2.  Patient will report 25% improvement in low back pain to improve QOL. Baseline: 8/10 worst 09/09/24: 2/10 today Goal status: MET-  3.  Patient will improve lumbar extension and sidebending to 75% ROM in all planes Baseline: 09/17/24 updated Goal status: MET   LONG TERM GOALS: Target date: 12/07/2024   Patient will be independent with ongoing/advanced HEP for self-management at home.  Baseline: no advanced HEP yet Goal status: INITIAL  2.  Patient  will report 50-75% improvement in low back pain to improve QOL.  Baseline: 8/10 10/12/24:  7/10 worst last week;  1/10 today Goal status: IN PROGRESS  3.  Patient to demonstrate ability to achieve and maintain good spinal alignment/posturing and body mechanics needed for daily activities. Baseline: some R lateral lean/list 10/12/24:  improving Goal status: IN PROGRESS  4.  Patient will demonstrate full pain free lumbar ROM to perform ADLs.   Baseline: Refer to above lumbar ROM table 09/17/24:  see updated tables above 10/12/24:  see updated tables above Goal status: IN PROGRESS  5.  Patient will demonstrate improved BLE strength to >/= 5/5 for improved stability and ease of mobility. Baseline: Refer to above LE MMT table 10/12/24:  updated tables above Goal status: IN PROGRESS  6. Patient will report </= 20% on Modified Oswestry (MCID = 12%) to demonstrate improved functional ability with decreased pain interference. Baseline: 32% 10/12/24:  30% Goal status: IN PROGRESS  7.  Patient will tolerate 30 min of (standing/sitting/walking) and being in any position in bed w/o increased pain to allow for  improved mobility and activity tolerance. Baseline: pain with twisting in any position, pain sitting unsupported without back support Goal status: INITIAL    PLAN:  PT FREQUENCY: 1-2x/week  PT DURATION: 8 weeks  PLANNED INTERVENTIONS: 02835- PT Re-evaluation, 97750- Physical Performance Testing, 97110-Therapeutic exercises, 97530- Therapeutic activity, [id number]- Neuromuscular re-education, 97535- Self Care, 02859- Manual therapy, [id number]- Electrical stimulation (unattended), [id number]- Ultrasound, 02987- Traction (mechanical), [id number]- Ionotophoresis 4mg /ml Dexamethasone , 79439 (1-2 muscles), 20561 (3+ muscles)- Dry Needling, Patient/Family education, Taping, Joint mobilization, Spinal mobilization, Cryotherapy, and Moist heat  PLAN FOR NEXT SESSION:  lots of stabilization with TA and/or PPT; quadruped  activities;  lots of stretching review;   MFR/modalities to back  for pain PRN  Ravis Herne, PT 10/12/2024, 1:09 PM  "

## 2024-10-15 ENCOUNTER — Ambulatory Visit

## 2024-10-15 DIAGNOSIS — M5459 Other low back pain: Secondary | ICD-10-CM | POA: Diagnosis not present

## 2024-10-15 DIAGNOSIS — M6281 Muscle weakness (generalized): Secondary | ICD-10-CM

## 2024-10-15 NOTE — Therapy (Signed)
 " OUTPATIENT PHYSICAL THERAPY THORACOLUMBAR TREATMENT /RECERTIFICATION    Patient Name: Samantha Shepard: [id number] DOB:Mar 06, 1956, 69 y.o., female Today's Date: 10/15/2024  END OF SESSION:  PT End of Session - 10/15/24 1145     Visit Number 9    Date for Recertification  12/07/04    PT Start Time 1105    PT Stop Time 1145    PT Time Calculation (min) 40 min    Activity Tolerance Patient tolerated treatment well;No increased pain    Behavior During Therapy WFL for tasks assessed/performed             Past Medical History:  Diagnosis Date   Allergy    dust, cats   Anxiety    Asthma    controlled   Hyperlipidemia    Hypertension    Left breast mass    Migraines    Past Surgical History:  Procedure Laterality Date   BREAST BIOPSY  10/17/2022   MM RT RADIOACTIVE SEED LOC MAMMO GUIDE 10/17/2022 [facility] MAMMOGRAPHY   BREAST EXCISIONAL BIOPSY Left 06/2019   benign   BREAST LUMPECTOMY WITH RADIOACTIVE SEED LOCALIZATION Left 07/08/2019   Procedure: LEFT BREAST LUMPECTOMY WITH RADIOACTIVE SEED LOCALIZATION;  Surgeon: Curvin Deward MOULD, MD;  Location: [facility];  Service: General;  Laterality: Left;   BREAST LUMPECTOMY WITH RADIOACTIVE SEED LOCALIZATION Right 10/18/2022   Procedure: RIGHT BREAST LUMPECTOMY WITH RADIOACTIVE SEED LOCALIZATION;  Surgeon: Curvin Deward III, MD;  Location: [facility];  Service: General;  Laterality: Right;   COLONOSCOPY  2009   neg; [facility]   thyroid  cyst aspirated     TONSILLECTOMY     Patient Active Problem List   Diagnosis Date Noted   Chronic lower back pain 08/04/2024   CKD (chronic kidney disease) stage 2, GFR 60-89 ml/min 02/03/2024   Not well controlled moderate persistent asthma 09/18/2022   Intraductal papilloma of right breast 08/20/2022   Agatston coronary artery calcium score of 1,   03/2022 03/17/2022   Gastroesophageal reflux disease 08/31/2020   Cough 08/31/2020   Sleep difficulties  07/29/2020   Temporomandibular joint (TMJ) pain 06/16/2018   Anxiety 06/16/2018   Cervical adenopathy 06/16/2018   Jaw deformity 06/16/2018   Prediabetes 08/01/2017   Tingling of both feet 04/18/2016   Osteopenia 01/15/2016   Mild persistent asthma 01/10/2016   Other allergic rhinitis 01/10/2016   Hyperlipidemia 08/13/2008   Hypertension 09/22/2007   Allergic rhinitis 05/26/2007    PCP: Geofm Glade PARAS, MD   REFERRING PROVIDER: Geofm Glade PARAS, MD   REFERRING DIAG: M54.50,G89.29 (ICD-10-CM) - Chronic bilateral low back pain without sciatica  THERAPY DIAG:  Other low back pain  Muscle weakness (generalized)  RATIONALE FOR EVALUATION AND TREATMENT: Rehabilitation  ONSET DATE: chronic, years ago  NEXT MD VISIT:    SUBJECTIVE:  SUBJECTIVE STATEMENT: Pt reports increased pain yesterday, did a lot yesterday  69 y/o patient referred to PT for chronic LBP.  States she has had pain for many years in her low back.  Denies any chiropractic or injections.   Has had xrays that showed DDD and facet OA.   She gets pain sitting upright without a back support on her chair.   She cannot tolerate sititng for more than a few minutes on the mat table today for the evaluation visit and has to sit in a chair with a back.   Rolling in bed and any twisting motions are most painful.   Ironing also increases pain.   Bending and doing laundry is ok. Very active, goes to [facility] and has a systems analyst.   Walks 2.5 to 5 miles daily.   Does standing back rotation with cable cross over machine and it hurt today.  Does planks and can hold for 1 min with her trainer.   Lives with spouse who is 76 y/o, but she does not have to do any personal care as he is mobile and healthy.    PAIN: Are you having pain?  Yes: NPRS scale: 3/10 now;  1/10 best over last week Pain location: central low back Pain description: aching, sore Aggravating factors: rolling over in bed Relieving factors: heat, not rolling in bed  PERTINENT HISTORY:  HTN, hyperlipidemia, osteopenia, prediabetes, CKD 2, R breast intraductal papilloma  PRECAUTIONS: None  RED FLAGS: None  WEIGHT BEARING RESTRICTIONS: No  FALLS:  Has patient fallen in last 6 months? No  LIVING ENVIRONMENT: Lives with: lives with their family and lives with their spouse Lives in: House/apartment Stairs: Yes: External: 1 steps; none Has following equipment at home: None  OCCUPATION: retired surveyor, quantity  PLOF: Independent with gait  PATIENT GOALS: have less back pain and be able to move in bed without pain   OBJECTIVE: (objective measures completed at initial evaluation unless otherwise dated)  DIAGNOSTIC FINDINGS:  EXAM: 4 VIEW(S) XRAY OF THE LUMBAR SPINE 08/04/2024 10:49:47 AM   COMPARISON: None available.   CLINICAL HISTORY: chronic pain w/o radiculopathy. Back pain x several years, recently pain is worse. NKI.   FINDINGS:   LUMBAR SPINE:   BONES: No acute fracture. No aggressive appearing osseous lesion. Alignment is normal.   DISCS AND DEGENERATIVE CHANGES: Mild endplate remodeling is seen throughout the lumbar spine in keeping with changes of mild degenerative disc disease. Facet arthrosis at L5-S1 noted.   SOFT TISSUES: No acute abnormality.   IMPRESSION: 1. Mild degenerative disc disease with endplate remodeling throughout the lumbar spine. 2. Facet arthrosis at L5-S1.   Electronically signed by: Dorethia Molt MD 08/05/2024 09:39 PM EDT RP Workstation: [id number]  EXAM: 3 VIEW(S) XRAY OF THE THORACIC SPINE 08/04/2024 10:49:47 AM   COMPARISON: None available.   CLINICAL HISTORY: chronic pain w/o radiculopathy. Back pain x several years, recently pain is worse. NKI.   FINDINGS:    BONES: No acute fracture. No aggressive appearing osseous lesion. Alignment is normal.   DISCS AND DEGENERATIVE CHANGES: Mild endplate remodeling within the upper mid thoracic spine in keeping with mild degenerative disc disease.   SOFT TISSUES: The visualized lungs are clear.   IMPRESSION: 1. Mild degenerative disc disease in the mid thoracic spine.   Electronically signed by: Dorethia Molt MD 08/05/2024 09:40 PM EDT RP Workstation: [id number]  PATIENT SURVEYS:  Modified Oswestry:  MODIFIED OSWESTRY DISABILITY SCALE 10/12/24  Date: 08/25/24 Score  Pain intensity 3 =  Pain medication provides me with moderate relief from pain. 2  2. Personal care (washing, dressing, etc.) 1 =  I can take care of myself normally, but it increases my pain. 0  3. Lifting 1 = I can lift heavy weights, but it causes increased pain. 0  4. Walking 0 = Pain does not prevent me from walking any distance 1  5. Sitting 2 =  Pain prevents me from sitting more than 1 hour. 3  6. Standing 1 =  I can stand as long as I want but, it increases my pain. 3  7. Sleeping 3 =  Even when I take pain medication, I sleep less than 4 hours. 3  8. Social Life 0 = My social life is normal and does not increase my pain. 1  9. Traveling 2 =  My pain restricts my travel over 2 hours. 1  10. Employment/ Homemaking 2 = I can perform most of my homemaking/job duties, but pain prevents me from performing more physically stressful activities (eg, lifting, vacuuming). 1  Total 16/50 = 32% 15/50 = 30%   Interpretation of scores: Score Category Description  0-20% Minimal Disability The patient can cope with most living activities. Usually no treatment is indicated apart from advice on lifting, sitting and exercise  21-40% Moderate Disability The patient experiences more pain and difficulty with sitting, lifting and standing. Travel and social life are more difficult and they may be disabled from work. Personal care, sexual activity and  sleeping are not grossly affected, and the patient can usually be managed by conservative means  41-60% Severe Disability Pain remains the main problem in this group, but activities of daily living are affected. These patients require a detailed investigation  61-80% Crippled Back pain impinges on all aspects of the patients life. Positive intervention is required  81-100% Bed-bound These patients are either bed-bound or exaggerating their symptoms  Bluford FORBES Zoe DELENA Karon DELENA, et al. Surgery versus conservative management of stable thoracolumbar fracture: the PRESTO feasibility RCT. [place] ([place]): [employer]; 2021 Nov. [facility] [employer] Assessment, No. 25.62.) Appendix 3, Oswestry Disability Index category descriptors. Available from: Findjewelers.cz  Minimally Clinically Important Difference (MCID) = 12.8%  SCREENING FOR RED FLAGS: Bowel or bladder incontinence: No Spinal tumors: No Cauda equina syndrome: No Compression fracture: No Abdominal aneurysm: No  COGNITION:  Overall cognitive status: Within functional limits for tasks assessed    SENSATION: WFL  POSTURE:  Supinated feet, lower R shoulder, leans a little to the R, fairly normal lordosis Supine:  R malleolus is slightly longer, R IC is slightly higher, ASIS appear equal  PALPATION: B paraspinals, L quadratus and L SI, and increased pain with   LUMBAR ROM:   Active  Eval 09/17/24 10/12/24  Flexion Mid shins, no pain Mid shins, no pain To ankles  Extension 50%; tight at end range 75% 85%  Right lateral flexion Mid thigh To knee To mid thigh  Left lateral flexion Upper thigh, pain and stiff To knee To knee  Right rotation 50%, stiff, some pain 60%; end range discomfort 75%  Left rotation 75%, min pain 100%, no pain 100%  (Blank rows = not tested)  MUSCLE LENGTH: Hamstrings: Right SLR = 80 deg; Left SLR = 75 deg Thomas test: Right 0 deg; Left 0 deg Hamstrings: tight  BLE ITB: Tight LLE Piriformis: mild tight BLE Hip flexors:  mild tight BLE Quads: NT Heelcord: NT  LOWER EXTREMITY ROM:     (  Blank rows = not tested)  LOWER EXTREMITY MMT:   Active  Right eval Left eval R/L 10/12/24  Hip flexion 5 5 5/5  Hip extension 3+ 4- 4-/4-  Hip abduction 5 4- 5/4  Hip adduction     Hip internal rotation 4+ 4 5/4+  Hip external rotation 5 5 5/5  Knee flexion 5 4 4+/4+  Knee extension 5 5 5/5  Ankle dorsiflexion 5 4 5/4+  Ankle plantarflexion   5/5  Ankle inversion     Ankle eversion      LUMBAR SPECIAL TESTS:  Straight leg raise test: Negative, Slump test: Negative, SI Compression/distraction test: Positive, FABER test: Positive, Gaenslen's test: Negative, and Thomas test: Negative;  FABER is positive on LLE only  FUNCTIONAL TESTS:   Long sit test RLE goes long to short at malleoli;  Standing:  Lower R shoulder;  supinator, L IT was tight, R piriformis causes some back pain with stretch;  R anterior/ L posterior pelvic rotation is a possibility  Long leg traction BLE relieves her back pain considerably  GAIT: Distance walked: into clinic x 150' Assistive device utilized: None Level of assistance: Complete Independence Gait pattern: very hard heel striker with supinatory roll BLE R >L; short quick step gait pattern, minimal trunk rotation Comments:    TODAY'S TREATMENT:  10/15/24 TPR to R lumbar paraspinals, QL, glute med; Foam roll IASTM as well  Seated BLE KTOS stretch 2x92min  Counter needle threads B UE x 10 Kneeling over orange pball: I,T,Y x 12 reps each Standing pallof press GTB x 20 each direction Standing side bends 10lb kettle bell B  10/12/24 THERAPEUTIC EXERCISE: To improve strength and endurance.  Demonstration, verbal and tactile cues throughout for technique. Bike L4 x 6'  THERAPEUTIC ACTIVITIES: To improve functional performance.  Demonstration, verbal and tactile cues throughout for technique. SLR hamstretch x 1' x 3  BLE Supine KTOS piriformis stretch x 1' x 3 BLE Supine hip flexor stretch x 1' x 2 BLE Seated FABER piriformis stretch x 1' BLE Prayer stretch w/ 10 sec holds x 6 Standing calf stretch on 1/2 foam roller x 1';   Soleus x 1' BLE Sidebending latissimus/QL stretch holding doorframe BUE x 20 sec x 2 each side Prone hip extension w/ TA x 2/10 BLE Counter plank w/ TA and thoracic rotation/shoulder HABD x 10 each side Rechecked strength and ROM  09/21/24 Bike L4x61min QL/lat stretch in doorway 2x30 sec IASTM foam roll to R lats with some manual pressure in L S/L  Seated L mod pigeon + R TL rotation 2x30 sec Standing lat pull downs 7lb x 20 with cable  Standing R shoulder ext 7lb with cable x 12; x 8 with 5lb  09/17/24 THERAPEUTIC EXERCISE: To improve strength and endurance.  Demonstration, verbal and tactile cues throughout for technique. NuStep L5 x 5'  NEUROMUSCULAR RE-EDUCATION: To improve coordination, kinesthesia, posture, and proprioception. 65 cm swiss ball: Bouncing EO x 1'; EC x 1'  Marching alternately x 1'  Marching w/ alternate arm raise x 1'  Alternate knee extension x 1'  F/B rolling x 1' Supine w/ LE's on 55 cm swiss ball at 90/90  B shoulder ER RTB x 10  B shoulder HABD RTB x 10  B shoulder D2 flexion PNF x 10 each way  THERAPEUTIC ACTIVITIES: To improve functional performance.  Demonstration, verbal and tactile cues throughout for technique. Leg press 55lb x 20 BLE Standing cable rows 20lb x 20 Standing shoulder extension 20lb  x 20 R lateral shift w/ RUE wall slide/reach x 10 R QL stretch in doorway x 30 sec Child's pose stretch x 10 sec x 5 Hooklying curl up and reach x 2/10 BUE  Supine = level malleoli BLE Long sit test = no change  09/14/24 Bike L3x47min Leg press 55lb x 20 BLE Standing cable rows 20lb x 20 Standing shoulder extension 20lb x 20 Sidesteps RTB at ankles 2x Standing hip abduction RTB 2x10 Standing hip extension RTB 2x10 Supine LTR both  ways, DKTC 09/09/24 THERAPEUTIC EXERCISE: To improve strength and endurance.  Demonstration, verbal and tactile cues throughout for technique. Bike L4 x 6'  THERAPEUTIC ACTIVITIES: To improve functional performance.  Demonstration, verbal and tactile cues throughout for technique. Seated hamstretch x 1' x 2 BLE Supine long sit test is only very slight long to short on the RLE;  malleoli are equal in supine Supine scapular depression BUE Supine pelvic depression w/ arms full flexed and relaxed overhead x 15 each; leg one at a time Supine L hip flexor stretch w/ SKTC on RLE x 1' w/ isometric hip extension on RLE--patient reports gets L foot numbness in this position Supine bridge w/ blue TB to knees x 20 BLE Supine SLR ham stretch by PT x 1' x 3 RLE Seated swiss ball rollouts x 10 from 11:00, 12:00, 1:00 Standing RUE wall reach w/ trunk SB stretch to the L x 1' x 2   Modified self MET for anterior R inominate w/ R SKTC w/ isometric hip ext  MANUAL THERAPY: To promote reduced pain utilizing joint mobilization. Long leg traction x 10-20 gentle grade 3-4 reps to each leg individually  MODALITIES: Static pelvic traction at neutral pull from 90/90 position at 77 lbs pull for 155 lb body weight x 5'--tolerates well, feels relief afterwards  09/07/24 NEUROMUSCULAR RE-EDUCATION: To improve coordination, kinesthesia, posture, and proprioception.  Bridges x 30 Supine hip ADD with TRA x 20 Supine hip ABD with TRA x 10 blue TB Bridges blue TB x 20 B hip flexion with ball squeeze x 12 supine MET shotgun technique for pelvic misalignment, RLE was longer after supine to long sit test Able to correct after METs Standing trunk rotation at wall Thread the needle on elevated mat table to L side  THERAPEUTIC EXERCISE: To improve strength, endurance, ROM, and flexibility.  Bike L4x69min LTR both ways x 10 B  09/02/24 NEUROMUSCULAR RE-EDUCATION: To improve coordination, kinesthesia, posture, and  proprioception.  Bridge knees bent BOSU ball 10x5  THERAPEUTIC EXERCISE: To improve strength, endurance, ROM, and flexibility.  Bike L4x81min Seated HS stretch BLE x 1 min Supine BLE thomas stretch x 1 min- increased LBP so taken out of HEP Supine KTOS stretch BLE x 1 min Sidelying hip ABD x 20 BLE  MANUAL THERAPY: To promote normalized muscle tension, improve joint mobility and/or for pain modulation  DTM to B thoracic paraspinals in prone IASTM with foam roll to thoracic paraspinals  Moist heat in supine to mid back x 8 min  08/25/24 SELF CARE: Provided education on PT POC progression., initial HEP  MODALITIES:   HP/premod ESTIM to B paraspinals from T8 to PSIS at machine default settings x 12-18 mA intensity x 15' in sitting (declined lying on back)   PATIENT EDUCATION:  Education details: adding L sidebending stretch at wall to HEP  Person educated: Patient Education method: Explanation, Demonstration, Verbal cues, Tactile cues, Handouts, and MedBridgeGO app access provided Education comprehension: verbalized understanding, verbal cues required, tactile  cues required, and needs further education  HOME EXERCISE PROGRAM: ASSESSMENT:  Access Code: [email or web address] URL: [email or web address] Date: 10/12/2024 Prepared by: Garnette Montclair  Exercises - Supine Bridge  - 1 x daily - 7 x weekly - 3 sets - 10 reps - Hooklying Abduction with Resistance  - 1 x daily - 7 x weekly - 3 sets - 10 reps - Plank with Thoracic Rotation on Counter  - 1 x daily - 7 x weekly - 3 sets - 10 reps - Supine Shoulder External Rotation on Foam Roll with Theraband  - 1 x daily - 7 x weekly - 1 sets - 10 reps - Supine Shoulder External Rotation with Resistance  - 1 x daily - 7 x weekly - 1 sets - 10 reps - Supine Shoulder Horizontal Abduction with Resistance  - 1 x daily - 7 x weekly - 1 sets - 10 reps - Supine PNF D2 Flexion with Resistance  - 1 x daily - 7 x weekly - 1 sets - 10 reps - Curl  Up with Reach  - 1 x daily - 7 x weekly - 2 sets - 10 reps - Child's Pose Stretch  - 1 x daily - 7 x weekly - 1 sets - 5 reps - 10 sec hold - Standing Quadratus Lumborum Stretch with Doorway  - 1 x daily - 7 x weekly - 1 sets - 2 reps - 1 min hold - Standing Gastroc Stretch on Step  - 1 x daily - 7 x weekly - 1 sets - 2 reps - 1 min hold - Seated Hamstring Stretch  - 1 x daily - 7 x weekly - 1 sets - 2 reps - 1 min hold - Seated Table Hamstring Stretch  - 1 x daily - 7 x weekly - 3 sets - 10 reps - Seated Figure 4 Piriformis Stretch  - 1 x daily - 7 x weekly - 1 sets - 2 reps - 1 min hold - Supine Piriformis Stretch with Foot on Ground  - 1 x daily - 7 x weekly - 1 sets - 2 reps   CLINICAL IMPRESSION: Progressed with manual TPR in many different spots today along the R lumbar and sacral region. Got some relief from MT today then continued with lumbar stabilization and stretching with good response. Will need new insurance authorization after next visit, recert was done last time. PT remains necessary for ROM, strength, functional stability, pain, and HEP deficits.  Continue per POC  EVAL:  Samantha Shepard is a 69 y.o. female who was referred to physical therapy for evaluation and treatment for LBP.   Patient reports onset of LBP pain beginning years ago. Pain is worse with rolling over in bed or twisting in any position.   Xrays showed DDD and facet OA.  She goes to the gym and works with a systems analyst.   She has very good leg strength with the exception of some L hip weakness.    However, she is very stiff and has some postural deficits.  L5 P/A glides are painful as well as rotational glides in each direction but particularly to the L.  Patient has deficits in lumbar ROM, lumbar/B LE flexibility, L hamstring, hip abductor, and extensor strength, abnormal posture, and TTP with abnormal muscle tension and pain which are interfering with ADLs and are impacting quality of life.  On Modified  Oswestry patient scored 16/50 demonstrating 32% or moderate disability.  Samantha Shepard will benefit from skilled PT  to address above deficits to improve mobility and activity tolerance with decreased pain interference.     OBJECTIVE IMPAIRMENTS: difficulty walking, decreased ROM, decreased strength, impaired flexibility, postural dysfunction, and pain.   ACTIVITY LIMITATIONS: lifting, bending, standing, sleeping, and locomotion level  PARTICIPATION LIMITATIONS: cleaning, laundry, and community activity  PERSONAL FACTORS: Age, Time since onset of injury/illness/exacerbation, and 1-2 comorbidities: HTN, hyperlipidemia, osteopenia, prediabetes, CKD 2, R breast intraductal papilloma are also affecting patient's functional outcome.   REHAB POTENTIAL: Good  CLINICAL DECISION MAKING: Evolving/moderate complexity  EVALUATION COMPLEXITY: Moderate   GOALS: Goals reviewed with patient? Yes  SHORT TERM GOALS: Target date: 11/09/2024   Patient will be independent with initial HEP to improve outcomes and carryover.  Baseline: 100% PT assist required for correct completion 09/09/24:  can teach back Goal status: MET  2.  Patient will report 25% improvement in low back pain to improve QOL. Baseline: 8/10 worst 09/09/24: 2/10 today Goal status: MET-  3.  Patient will improve lumbar extension and sidebending to 75% ROM in all planes Baseline: 09/17/24 updated Goal status: MET   LONG TERM GOALS: Target date: 12/07/2024   Patient will be independent with ongoing/advanced HEP for self-management at home.  Baseline: no advanced HEP yet Goal status: INITIAL  2.  Patient will report 50-75% improvement in low back pain to improve QOL.  Baseline: 8/10 10/12/24:  7/10 worst last week;  1/10 today Goal status: IN PROGRESS  3.  Patient to demonstrate ability to achieve and maintain good spinal alignment/posturing and body mechanics needed for daily activities. Baseline: some R lateral lean/list 10/12/24:   improving Goal status: IN PROGRESS  4.  Patient will demonstrate full pain free lumbar ROM to perform ADLs.   Baseline: Refer to above lumbar ROM table 09/17/24:  see updated tables above 10/12/24:  see updated tables above Goal status: IN PROGRESS  5.  Patient will demonstrate improved BLE strength to >/= 5/5 for improved stability and ease of mobility. Baseline: Refer to above LE MMT table 10/12/24:  updated tables above Goal status: IN PROGRESS  6. Patient will report </= 20% on Modified Oswestry (MCID = 12%) to demonstrate improved functional ability with decreased pain interference. Baseline: 32% 10/12/24:  30% Goal status: IN PROGRESS  7.  Patient will tolerate 30 min of (standing/sitting/walking) and being in any position in bed w/o increased pain to allow for  improved mobility and activity tolerance. Baseline: pain with twisting in any position, pain sitting unsupported without back support Goal status: INITIAL    PLAN:  PT FREQUENCY: 1-2x/week  PT DURATION: 8 weeks  PLANNED INTERVENTIONS: 02835- PT Re-evaluation, 97750- Physical Performance Testing, 97110-Therapeutic exercises, 97530- Therapeutic activity, [id number]- Neuromuscular re-education, 97535- Self Care, 02859- Manual therapy, G0283- Electrical stimulation (unattended), [id number]- Ultrasound, 02987- Traction (mechanical), [id number]- Ionotophoresis 4mg /ml Dexamethasone , 79439 (1-2 muscles), 20561 (3+ muscles)- Dry Needling, Patient/Family education, Taping, Joint mobilization, Spinal mobilization, Cryotherapy, and Moist heat  PLAN FOR NEXT SESSION:  lots of stabilization with TA and/or PPT; quadruped activities;  lots of stretching review;   MFR/modalities to back for pain PRN  Samantha Shepard, PTA 10/15/2024, 11:56 AM  "

## 2024-10-19 ENCOUNTER — Ambulatory Visit: Admitting: Rehabilitation

## 2024-10-22 ENCOUNTER — Ambulatory Visit

## 2024-10-22 DIAGNOSIS — M5459 Other low back pain: Secondary | ICD-10-CM

## 2024-10-22 DIAGNOSIS — M6281 Muscle weakness (generalized): Secondary | ICD-10-CM

## 2024-10-22 NOTE — Therapy (Signed)
 " OUTPATIENT PHYSICAL THERAPY THORACOLUMBAR TREATMENT /RECERTIFICATION    Patient Name: Samantha Shepard MRN: [id number] DOB:12/09/1955, 69 y.o., female Today's Date: 10/22/2024  END OF SESSION:  PT End of Session - 10/22/24 1144     Visit Number 10    Date for Recertification  12/07/04    Progress Note Due on Visit 18    PT Start Time 1104    PT Stop Time 1144    PT Time Calculation (min) 40 min    Activity Tolerance Patient tolerated treatment well;No increased pain    Behavior During Therapy WFL for tasks assessed/performed              Past Medical History:  Diagnosis Date   Allergy    dust, cats   Anxiety    Asthma    controlled   Hyperlipidemia    Hypertension    Left breast mass    Migraines    Past Surgical History:  Procedure Laterality Date   BREAST BIOPSY  10/17/2022   MM RT RADIOACTIVE SEED LOC MAMMO GUIDE 10/17/2022 [facility]MO[facility]AP[facility]   BREAST EXCISIONAL BIOPSY Left 06/2019   benign   BREAST LUMPECTOMY WITH RADIOACTIVE SEED LOCALIZATION Left 07/08/2019   Procedure: LEFT BREAST LUMPECTOMY WITH RADIOACTIVE SEED LOCALIZATION;  Surgeon: Curvin Deward MOULD, MD;  Location: [facility];  Service: General;  Laterality: Left;   BREAST LUMPECTOMY WITH RADIOACTIVE SEED LOCALIZATION Right 10/18/2022   Procedure: RIGHT BREAST LUMPECTOMY WITH RADIOACTIVE SEED LOCALIZATION;  Surgeon: Curvin Deward III, MD;  Location: [facility];  Service: General;  Laterality: Right;   COLONOSCOPY  2009   neg; [facility]   thyroid  cyst aspirated     TONSILLECTOMY     Patient Active Problem List   Diagnosis Date Noted   Chronic lower back pain 08/04/2024   CKD (chronic kidney disease) stage 2, GFR 60-89 ml/min 02/03/2024   Not well controlled moderate persistent asthma 09/18/2022   Intraductal papilloma of right breast 08/20/2022   Agatston coronary artery calcium score of 1,   03/2022 03/17/2022   Gastroesophageal reflux disease 08/31/2020   Cough  08/31/2020   Sleep difficulties 07/29/2020   Temporomandibular joint (TMJ) pain 06/16/2018   Anxiety 06/16/2018   Cervical adenopathy 06/16/2018   Jaw deformity 06/16/2018   Prediabetes 08/01/2017   Tingling of both feet 04/18/2016   Osteopenia 01/15/2016   Mild persistent asthma 01/10/2016   Other allergic rhinitis 01/10/2016   Hyperlipidemia 08/13/2008   Hypertension 09/22/2007   Allergic rhinitis 05/26/2007    PCP: Geofm Glade PARAS, MD   REFERRING PROVIDER: Geofm Glade PARAS, MD   REFERRING DIAG: M54.50,G89.29 (ICD-10-CM) - Chronic bilateral low back pain without sciatica  THERAPY DIAG:  Other low back pain  Muscle weakness (generalized)  RATIONALE FOR EVALUATION AND TREATMENT: Rehabilitation  ONSET DATE: chronic, years ago  NEXT MD VISIT:    SUBJECTIVE:  SUBJECTIVE STATEMENT: Pt reports increased pain yesterday, did a lot yesterday, she was sick over the weekend with flu, started to feel better two days ago so has not done any exercise since Friday  68 y/o patient referred to PT for chronic LBP.  States she has had pain for many years in her low back.  Denies any chiropractic or injections.   Has had xrays that showed DDD and facet OA.   She gets pain sitting upright without a back support on her chair.   She cannot tolerate sititng for more than a few minutes on the mat table today for the evaluation visit and has to sit in a chair with a back.   Rolling in bed and any twisting motions are most painful.   Ironing also increases pain.   Bending and doing laundry is ok. Very active, goes to [facility] hospital gym and has a systems analyst.   Walks 2.5 to 5 miles daily.   Does standing back rotation with cable cross over machine and it hurt today.  Does planks and can hold for 1 min with her  trainer.   Lives with spouse who is 7 y/o, but she does not have to do any personal care as he is mobile and healthy.    PAIN: Are you having pain? Yes: NPRS scale: 3/10 now;  1/10 best over last week Pain location: central low back Pain description: aching, sore Aggravating factors: rolling over in bed Relieving factors: heat, not rolling in bed  PERTINENT HISTORY:  HTN, hyperlipidemia, osteopenia, prediabetes, CKD 2, R breast intraductal papilloma  PRECAUTIONS: None  RED FLAGS: None  WEIGHT BEARING RESTRICTIONS: No  FALLS:  Has patient fallen in last 6 months? No  LIVING ENVIRONMENT: Lives with: lives with their family and lives with their spouse Lives in: House/apartment Stairs: Yes: External: 1 steps; none Has following equipment at home: None  OCCUPATION: retired surveyor, quantity  PLOF: Independent with gait  PATIENT GOALS: have less back pain and be able to move in bed without pain   OBJECTIVE: (objective measures completed at initial evaluation unless otherwise dated)  DIAGNOSTIC FINDINGS:  EXAM: 4 VIEW(S) XRAY OF THE LUMBAR SPINE 08/04/2024 10:49:47 AM   COMPARISON: None available.   CLINICAL HISTORY: chronic pain w/o radiculopathy. Back pain x several years, recently pain is worse. NKI.   FINDINGS:   LUMBAR SPINE:   BONES: No acute fracture. No aggressive appearing osseous lesion. Alignment is normal.   DISCS AND DEGENERATIVE CHANGES: Mild endplate remodeling is seen throughout the lumbar spine in keeping with changes of mild degenerative disc disease. Facet arthrosis at L5-S1 noted.   SOFT TISSUES: No acute abnormality.   IMPRESSION: 1. Mild degenerative disc disease with endplate remodeling throughout the lumbar spine. 2. Facet arthrosis at L5-S1.   Electronically signed by: Dorethia Molt MD 08/05/2024 09:39 PM EDT RP Workstation: [id number]  EXAM: 3 VIEW(S) XRAY OF THE THORACIC SPINE 08/04/2024 10:49:47 AM    COMPARISON: None available.   CLINICAL HISTORY: chronic pain w/o radiculopathy. Back pain x several years, recently pain is worse. NKI.   FINDINGS:   BONES: No acute fracture. No aggressive appearing osseous lesion. Alignment is normal.   DISCS AND DEGENERATIVE CHANGES: Mild endplate remodeling within the upper mid thoracic spine in keeping with mild degenerative disc disease.   SOFT TISSUES: The visualized lungs are clear.   IMPRESSION: 1. Mild degenerative disc disease in the mid thoracic spine.   Electronically signed by: Dorethia Molt MD  08/05/2024 09:40 PM EDT RP Workstation: [id number]  PATIENT SURVEYS:  Modified Oswestry:  MODIFIED OSWESTRY DISABILITY SCALE 10/12/24  Date: 08/25/24 Score   Pain intensity 3 =  Pain medication provides me with moderate relief from pain. 2  2. Personal care (washing, dressing, etc.) 1 =  I can take care of myself normally, but it increases my pain. 0  3. Lifting 1 = I can lift heavy weights, but it causes increased pain. 0  4. Walking 0 = Pain does not prevent me from walking any distance 1  5. Sitting 2 =  Pain prevents me from sitting more than 1 hour. 3  6. Standing 1 =  I can stand as long as I want but, it increases my pain. 3  7. Sleeping 3 =  Even when I take pain medication, I sleep less than 4 hours. 3  8. Social Life 0 = My social life is normal and does not increase my pain. 1  9. Traveling 2 =  My pain restricts my travel over 2 hours. 1  10. Employment/ Homemaking 2 = I can perform most of my homemaking/job duties, but pain prevents me from performing more physically stressful activities (eg, lifting, vacuuming). 1  Total 16/50 = 32% 15/50 = 30%   Interpretation of scores: Score Category Description  0-20% Minimal Disability The patient can cope with most living activities. Usually no treatment is indicated apart from advice on lifting, sitting and exercise  21-40% Moderate Disability The patient experiences more pain and  difficulty with sitting, lifting and standing. Travel and social life are more difficult and they may be disabled from work. Personal care, sexual activity and sleeping are not grossly affected, and the patient can usually be managed by conservative means  41-60% Severe Disability Pain remains the main problem in this group, but activities of daily living are affected. These patients require a detailed investigation  61-80% Crippled Back pain impinges on all aspects of the patients life. Positive intervention is required  81-100% Bed-bound These patients are either bed-bound or exaggerating their symptoms  Bluford FORBES Zoe DELENA Karon DELENA, et al. Surgery versus conservative management of stable thoracolumbar fracture: the PRESTO feasibility RCT. [place] ([place]): [employer]; 2021 Nov. [facility] Technology Assessment, No. 25.62.) Appendix 3, Oswestry Disability Index category descriptors. Available from: Findjewelers.cz  Minimally Clinically Important Difference (MCID) = 12.8%  SCREENING FOR RED FLAGS: Bowel or bladder incontinence: No Spinal tumors: No Cauda equina syndrome: No Compression fracture: No Abdominal aneurysm: No  COGNITION:  Overall cognitive status: Within functional limits for tasks assessed    SENSATION: WFL  POSTURE:  Supinated feet, lower R shoulder, leans a little to the R, fairly normal lordosis Supine:  R malleolus is slightly longer, R IC is slightly higher, ASIS appear equal  PALPATION: B paraspinals, L quadratus and L SI, and increased pain with   LUMBAR ROM:   Active  Eval 09/17/24 10/12/24  Flexion Mid shins, no pain Mid shins, no pain To ankles  Extension 50%; tight at end range 75% 85%  Right lateral flexion Mid thigh To knee To mid thigh  Left lateral flexion Upper thigh, pain and stiff To knee To knee  Right rotation 50%, stiff, some pain 60%; end range discomfort 75%  Left rotation 75%, min pain 100%, no pain 100%   (Blank rows = not tested)  MUSCLE LENGTH: Hamstrings: Right SLR = 80 deg; Left SLR = 75 deg Thomas test: Right 0 deg; Left 0 deg Hamstrings:  tight BLE ITB: Tight LLE Piriformis: mild tight BLE Hip flexors:  mild tight BLE Quads: NT Heelcord: NT  LOWER EXTREMITY ROM:     (Blank rows = not tested)  LOWER EXTREMITY MMT:   Active  Right eval Left eval R/L 10/12/24 R/L 10/22/24  Hip flexion 5 5 5/5   Hip extension 3+ 4- 4-/4- 4-/4-  Hip abduction 5 4- 5/4 5/4+  Hip adduction      Hip internal rotation 4+ 4 5/4+   Hip external rotation 5 5 5/5   Knee flexion 5 4 4+/4+ 5/5  Knee extension 5 5 5/5 5/5  Ankle dorsiflexion 5 4 5/4+   Ankle plantarflexion   5/5   Ankle inversion      Ankle eversion       LUMBAR SPECIAL TESTS:  Straight leg raise test: Negative, Slump test: Negative, SI Compression/distraction test: Positive, FABER test: Positive, Gaenslen's test: Negative, and Thomas test: Negative;  FABER is positive on LLE only  FUNCTIONAL TESTS:   Long sit test RLE goes long to short at malleoli;  Standing:  Lower R shoulder;  supinator, L IT was tight, R piriformis causes some back pain with stretch;  R anterior/ L posterior pelvic rotation is a possibility  Long leg traction BLE relieves her back pain considerably  GAIT: Distance walked: into clinic x 150' Assistive device utilized: None Level of assistance: Complete Independence Gait pattern: very hard heel striker with supinatory roll BLE R >L; short quick step gait pattern, minimal trunk rotation Comments:    TODAY'S TREATMENT:  10/22/24 Nustep L5x4min UE/LE Tested LE strength STM to L levator, rhomboids, UT Seated on green swiss ball: Bouncing x 1 min Ant/post x 1 min Lateral WS x 1 min Pelvic circles x both ways Marching BLE x 10 Seated LAQ BLE x 10 Alternating UE/LE raise  10/15/24 TPR to R lumbar paraspinals, QL, glute med; Foam roll IASTM as well  Seated BLE KTOS stretch 2x42min  Counter needle  threads B UE x 10 Kneeling over orange pball: I,T,Y x 12 reps each Standing pallof press GTB x 20 each direction Standing side bends 10lb kettle bell B  10/12/24 THERAPEUTIC EXERCISE: To improve strength and endurance.  Demonstration, verbal and tactile cues throughout for technique. Bike L4 x 6'  THERAPEUTIC ACTIVITIES: To improve functional performance.  Demonstration, verbal and tactile cues throughout for technique. SLR hamstretch x 1' x 3 BLE Supine KTOS piriformis stretch x 1' x 3 BLE Supine hip flexor stretch x 1' x 2 BLE Seated FABER piriformis stretch x 1' BLE Prayer stretch w/ 10 sec holds x 6 Standing calf stretch on 1/2 foam roller x 1';   Soleus x 1' BLE Sidebending latissimus/QL stretch holding doorframe BUE x 20 sec x 2 each side Prone hip extension w/ TA x 2/10 BLE Counter plank w/ TA and thoracic rotation/shoulder HABD x 10 each side Rechecked strength and ROM  09/21/24 Bike L4x21min QL/lat stretch in doorway 2x30 sec IASTM foam roll to R lats with some manual pressure in L S/L  Seated L mod pigeon + R TL rotation 2x30 sec Standing lat pull downs 7lb x 20 with cable  Standing R shoulder ext 7lb with cable x 12; x 8 with 5lb  09/17/24 THERAPEUTIC EXERCISE: To improve strength and endurance.  Demonstration, verbal and tactile cues throughout for technique. NuStep L5 x 5'  NEUROMUSCULAR RE-EDUCATION: To improve coordination, kinesthesia, posture, and proprioception. 65 cm swiss ball: Bouncing EO x 1'; EC x  1'  Marching alternately x 1'  Marching w/ alternate arm raise x 1'  Alternate knee extension x 1'  F/B rolling x 1' Supine w/ LE's on 55 cm swiss ball at 90/90  B shoulder ER RTB x 10  B shoulder HABD RTB x 10  B shoulder D2 flexion PNF x 10 each way  THERAPEUTIC ACTIVITIES: To improve functional performance.  Demonstration, verbal and tactile cues throughout for technique. Leg press 55lb x 20 BLE Standing cable rows 20lb x 20 Standing shoulder extension  20lb x 20 R lateral shift w/ RUE wall slide/reach x 10 R QL stretch in doorway x 30 sec Child's pose stretch x 10 sec x 5 Hooklying curl up and reach x 2/10 BUE  Supine = level malleoli BLE Long sit test = no change  09/14/24 Bike L3x39min Leg press 55lb x 20 BLE Standing cable rows 20lb x 20 Standing shoulder extension 20lb x 20 Sidesteps RTB at ankles 2x Standing hip abduction RTB 2x10 Standing hip extension RTB 2x10 Supine LTR both ways, DKTC 09/09/24 THERAPEUTIC EXERCISE: To improve strength and endurance.  Demonstration, verbal and tactile cues throughout for technique. Bike L4 x 6'  THERAPEUTIC ACTIVITIES: To improve functional performance.  Demonstration, verbal and tactile cues throughout for technique. Seated hamstretch x 1' x 2 BLE Supine long sit test is only very slight long to short on the RLE;  malleoli are equal in supine Supine scapular depression BUE Supine pelvic depression w/ arms full flexed and relaxed overhead x 15 each; leg one at a time Supine L hip flexor stretch w/ SKTC on RLE x 1' w/ isometric hip extension on RLE--patient reports gets L foot numbness in this position Supine bridge w/ blue TB to knees x 20 BLE Supine SLR ham stretch by PT x 1' x 3 RLE Seated swiss ball rollouts x 10 from 11:00, 12:00, 1:00 Standing RUE wall reach w/ trunk SB stretch to the L x 1' x 2   Modified self MET for anterior R inominate w/ R SKTC w/ isometric hip ext  MANUAL THERAPY: To promote reduced pain utilizing joint mobilization. Long leg traction x 10-20 gentle grade 3-4 reps to each leg individually  MODALITIES: Static pelvic traction at neutral pull from 90/90 position at 77 lbs pull for 155 lb body weight x 5'--tolerates well, feels relief afterwards  09/07/24 NEUROMUSCULAR RE-EDUCATION: To improve coordination, kinesthesia, posture, and proprioception.  Bridges x 30 Supine hip ADD with TRA x 20 Supine hip ABD with TRA x 10 blue TB Bridges blue TB x 20 B hip  flexion with ball squeeze x 12 supine MET shotgun technique for pelvic misalignment, RLE was longer after supine to long sit test Able to correct after METs Standing trunk rotation at wall Thread the needle on elevated mat table to L side  THERAPEUTIC EXERCISE: To improve strength, endurance, ROM, and flexibility.  Bike L4x70min LTR both ways x 10 B  09/02/24 NEUROMUSCULAR RE-EDUCATION: To improve coordination, kinesthesia, posture, and proprioception.  Bridge knees bent BOSU ball 10x5  THERAPEUTIC EXERCISE: To improve strength, endurance, ROM, and flexibility.  Bike L4x4min Seated HS stretch BLE x 1 min Supine BLE thomas stretch x 1 min- increased LBP so taken out of HEP Supine KTOS stretch BLE x 1 min Sidelying hip ABD x 20 BLE  MANUAL THERAPY: To promote normalized muscle tension, improve joint mobility and/or for pain modulation  DTM to B thoracic paraspinals in prone IASTM with foam roll to thoracic paraspinals  Moist  heat in supine to mid back x 8 min  08/25/24 SELF CARE: Provided education on PT POC progression., initial HEP  MODALITIES:   HP/premod ESTIM to B paraspinals from T8 to PSIS at machine default settings x 12-18 mA intensity x 15' in sitting (declined lying on back)   PATIENT EDUCATION:  Education details: adding L sidebending stretch at wall to HEP  Person educated: Patient Education method: Explanation, Demonstration, Verbal cues, Tactile cues, Handouts, and MedBridgeGO app access provided Education comprehension: verbalized understanding, verbal cues required, tactile cues required, and needs further education  HOME EXERCISE PROGRAM: ASSESSMENT:  Access Code: [email or web address] URL: [email or web address] Date: 10/12/2024 Prepared by: Garnette Montclair  Exercises - Supine Bridge  - 1 x daily - 7 x weekly - 3 sets - 10 reps - Hooklying Abduction with Resistance  - 1 x daily - 7 x weekly - 3 sets - 10 reps - Plank with Thoracic Rotation on  Counter  - 1 x daily - 7 x weekly - 3 sets - 10 reps - Supine Shoulder External Rotation on Foam Roll with Theraband  - 1 x daily - 7 x weekly - 1 sets - 10 reps - Supine Shoulder External Rotation with Resistance  - 1 x daily - 7 x weekly - 1 sets - 10 reps - Supine Shoulder Horizontal Abduction with Resistance  - 1 x daily - 7 x weekly - 1 sets - 10 reps - Supine PNF D2 Flexion with Resistance  - 1 x daily - 7 x weekly - 1 sets - 10 reps - Curl Up with Reach  - 1 x daily - 7 x weekly - 2 sets - 10 reps - Child's Pose Stretch  - 1 x daily - 7 x weekly - 1 sets - 5 reps - 10 sec hold - Standing Quadratus Lumborum Stretch with Doorway  - 1 x daily - 7 x weekly - 1 sets - 2 reps - 1 min hold - Standing Gastroc Stretch on Step  - 1 x daily - 7 x weekly - 1 sets - 2 reps - 1 min hold - Seated Hamstring Stretch  - 1 x daily - 7 x weekly - 1 sets - 2 reps - 1 min hold - Seated Table Hamstring Stretch  - 1 x daily - 7 x weekly - 3 sets - 10 reps - Seated Figure 4 Piriformis Stretch  - 1 x daily - 7 x weekly - 1 sets - 2 reps - 1 min hold - Supine Piriformis Stretch with Foot on Ground  - 1 x daily - 7 x weekly - 1 sets - 2 reps   CLINICAL IMPRESSION: Pt arrived noting TTP and demonstrating trigger points in her UT area, this limited her ability to participate so I did MT to this area. Strength has improved a bit from last time, her weakness continues in the gluteus maximus which is crucial for lumbar support. ROM was reassessed last week, showing improvements. PT remains necessary for ROM, strength, functional stability, pain, and HEP deficits.  Continue per POC  EVAL:  Jaline Pincock is a 69 y.o. female who was referred to physical therapy for evaluation and treatment for LBP.   Patient reports onset of LBP pain beginning years ago. Pain is worse with rolling over in bed or twisting in any position.   Xrays showed DDD and facet OA.  She goes to the gym and works with a systems analyst.   She  has very good  leg strength with the exception of some L hip weakness.    However, she is very stiff and has some postural deficits.  L5 P/A glides are painful as well as rotational glides in each direction but particularly to the L.  Patient has deficits in lumbar ROM, lumbar/B LE flexibility, L hamstring, hip abductor, and extensor strength, abnormal posture, and TTP with abnormal muscle tension and pain which are interfering with ADLs and are impacting quality of life.  On Modified Oswestry patient scored 16/50 demonstrating 32% or moderate disability.  Jaszmine will benefit from skilled PT to address above deficits to improve mobility and activity tolerance with decreased pain interference.     OBJECTIVE IMPAIRMENTS: difficulty walking, decreased ROM, decreased strength, impaired flexibility, postural dysfunction, and pain.   ACTIVITY LIMITATIONS: lifting, bending, standing, sleeping, and locomotion level  PARTICIPATION LIMITATIONS: cleaning, laundry, and community activity  PERSONAL FACTORS: Age, Time since onset of injury/illness/exacerbation, and 1-2 comorbidities: HTN, hyperlipidemia, osteopenia, prediabetes, CKD 2, R breast intraductal papilloma are also affecting patient's functional outcome.   REHAB POTENTIAL: Good  CLINICAL DECISION MAKING: Evolving/moderate complexity  EVALUATION COMPLEXITY: Moderate   GOALS: Goals reviewed with patient? Yes  SHORT TERM GOALS: Target date: 11/09/2024   Patient will be independent with initial HEP to improve outcomes and carryover.  Baseline: 100% PT assist required for correct completion 09/09/24:  can teach back Goal status: MET  2.  Patient will report 25% improvement in low back pain to improve QOL. Baseline: 8/10 worst 09/09/24: 2/10 today Goal status: MET-  3.  Patient will improve lumbar extension and sidebending to 75% ROM in all planes Baseline: 09/17/24 updated Goal status: MET   LONG TERM GOALS: Target date:  12/07/2024   Patient will be independent with ongoing/advanced HEP for self-management at home.  Baseline: no advanced HEP yet Goal status: INITIAL  2.  Patient will report 50-75% improvement in low back pain to improve QOL.  Baseline: 8/10 10/12/24:  7/10 worst last week;  1/10 today Goal status: IN PROGRESS  3.  Patient to demonstrate ability to achieve and maintain good spinal alignment/posturing and body mechanics needed for daily activities. Baseline: some R lateral lean/list 10/12/24:  improving Goal status: IN PROGRESS  4.  Patient will demonstrate full pain free lumbar ROM to perform ADLs.   Baseline: Refer to above lumbar ROM table 09/17/24:  see updated tables above 10/12/24:  see updated tables above Goal status: IN PROGRESS  5.  Patient will demonstrate improved BLE strength to >/= 5/5 for improved stability and ease of mobility. Baseline: Refer to above LE MMT table 10/12/24:  updated tables above Goal status: IN PROGRESS- 10/22/24 improving   6. Patient will report </= 20% on Modified Oswestry (MCID = 12%) to demonstrate improved functional ability with decreased pain interference. Baseline: 32% 10/12/24:  30% Goal status: IN PROGRESS  7.  Patient will tolerate 30 min of (standing/sitting/walking) and being in any position in bed w/o increased pain to allow for  improved mobility and activity tolerance. Baseline: pain with twisting in any position, pain sitting unsupported without back support Goal status: IN PROGRESS- can walk for 30 min w/o pain,     PLAN:  PT FREQUENCY: 1-2x/week  PT DURATION: 8 weeks  PLANNED INTERVENTIONS: 97164- PT Re-evaluation, 97750- Physical Performance Testing, 97110-Therapeutic exercises, 97530- Therapeutic activity, [id number]- Neuromuscular re-education, 97535- Self Care, [id number]- Manual therapy, [id number]- Electrical stimulation (unattended), [id number]- Ultrasound, 02987- Traction (mechanical), [id number]- Ionotophoresis 4mg /ml Dexamethasone , 79439 (1-2  muscles), 20561 (3+ muscles)- Dry  Needling, Patient/Family education, Taping, Joint mobilization, Spinal mobilization, Cryotherapy, and Moist heat  PLAN FOR NEXT SESSION:  lots of stabilization with TA and/or PPT; quadruped activities;  lots of stretching review;   MFR/modalities to back for pain PRN  Ayuub Penley L Morayo Leven, PTA 10/22/2024, 11:55 AM  "

## 2024-10-25 NOTE — Therapy (Signed)
 " OUTPATIENT PHYSICAL THERAPY THORACOLUMBAR TREATMENT /RECERTIFICATION    Patient Name: Samantha Shepard MRN: [id number] DOB:01/29/1956, 69 y.o., female Today's Date: 10/25/2024  END OF SESSION:        Past Medical History:  Diagnosis Date   Allergy    dust, cats   Anxiety    Asthma    controlled   Hyperlipidemia    Hypertension    Left breast mass    Migraines    Past Surgical History:  Procedure Laterality Date   BREAST BIOPSY  10/17/2022   MM RT RADIOACTIVE SEED LOC MAMMO GUIDE 10/17/2022 [facility]AMMOGRAP[facility]   BREAST EXCISIONAL BIOPSY Left 06/2019   benign   BREAST LUMPECTOMY WITH RADIOACTIVE SEED LOCALIZATION Left 07/08/2019   Procedure: LEFT BREAST LUMPECTOMY WITH RADIOACTIVE SEED LOCALIZATION;  Surgeon: Curvin Deward MOULD, MD;  Location: [facility];  Service: General;  Laterality: Left;   BREAST LUMPECTOMY WITH RADIOACTIVE SEED LOCALIZATION Right 10/18/2022   Procedure: RIGHT BREAST LUMPECTOMY WITH RADIOACTIVE SEED LOCALIZATION;  Surgeon: Curvin Deward MOULD, MD;  Location: [facility];  Service: General;  Laterality: Right;   COLONOSCOPY  2009   neg; [facility]   thyroid  cyst aspirated     TONSILLECTOMY     Patient Active Problem List   Diagnosis Date Noted   Chronic lower back pain 08/04/2024   CKD (chronic kidney disease) stage 2, GFR 60-89 ml/min 02/03/2024   Not well controlled moderate persistent asthma 09/18/2022   Intraductal papilloma of right breast 08/20/2022   Agatston coronary artery calcium score of 1,   03/2022 03/17/2022   Gastroesophageal reflux disease 08/31/2020   Cough 08/31/2020   Sleep difficulties 07/29/2020   Temporomandibular joint (TMJ) pain 06/16/2018   Anxiety 06/16/2018   Cervical adenopathy 06/16/2018   Jaw deformity 06/16/2018   Prediabetes 08/01/2017   Tingling of both feet 04/18/2016   Osteopenia 01/15/2016   Mild persistent asthma 01/10/2016   Other allergic rhinitis 01/10/2016   Hyperlipidemia  08/13/2008   Hypertension 09/22/2007   Allergic rhinitis 05/26/2007    PCP: Geofm Glade PARAS, MD   REFERRING PROVIDER: Geofm Glade PARAS, MD   REFERRING DIAG: M54.50,G89.29 (ICD-10-CM) - Chronic bilateral low back pain without sciatica  THERAPY DIAG:  No diagnosis found.  RATIONALE FOR EVALUATION AND TREATMENT: Rehabilitation  ONSET DATE: chronic, years ago  NEXT MD VISIT:    SUBJECTIVE:  SUBJECTIVE STATEMENT: Pt reports increased pain yesterday, did a lot yesterday, she was sick over the weekend with flu, started to feel better two days ago so has not done any exercise since Friday  68 y/o patient referred to PT for chronic LBP.  States she has had pain for many years in her low back.  Denies any chiropractic or injections.   Has had xrays that showed DDD and facet OA.   She gets pain sitting upright without a back support on her chair.   She cannot tolerate sititng for more than a few minutes on the mat table today for the evaluation visit and has to sit in a chair with a back.   Rolling in bed and any twisting motions are most painful.   Ironing also increases pain.   Bending and doing laundry is ok. Very active, goes to [facility] hospital gym and has a systems analyst.   Walks 2.5 to 5 miles daily.   Does standing back rotation with cable cross over machine and it hurt today.  Does planks and can hold for 1 min with her trainer.   Lives with spouse who is 35 y/o, but she does not have to do any personal care as he is mobile and healthy.    PAIN: Are you having pain? Yes: NPRS scale: 3/10 now;  1/10 best over last week Pain location: central low back Pain description: aching, sore Aggravating factors: rolling over in bed Relieving factors: heat, not rolling in bed  PERTINENT HISTORY:  HTN,  hyperlipidemia, osteopenia, prediabetes, CKD 2, R breast intraductal papilloma  PRECAUTIONS: None  RED FLAGS: None  WEIGHT BEARING RESTRICTIONS: No  FALLS:  Has patient fallen in last 6 months? No  LIVING ENVIRONMENT: Lives with: lives with their family and lives with their spouse Lives in: House/apartment Stairs: Yes: External: 1 steps; none Has following equipment at home: None  OCCUPATION: retired surveyor, quantity  PLOF: Independent with gait  PATIENT GOALS: have less back pain and be able to move in bed without pain   OBJECTIVE: (objective measures completed at initial evaluation unless otherwise dated)  DIAGNOSTIC FINDINGS:  EXAM: 4 VIEW(S) XRAY OF THE LUMBAR SPINE 08/04/2024 10:49:47 AM   COMPARISON: None available.   CLINICAL HISTORY: chronic pain w/o radiculopathy. Back pain x several years, recently pain is worse. NKI.   FINDINGS:   LUMBAR SPINE:   BONES: No acute fracture. No aggressive appearing osseous lesion. Alignment is normal.   DISCS AND DEGENERATIVE CHANGES: Mild endplate remodeling is seen throughout the lumbar spine in keeping with changes of mild degenerative disc disease. Facet arthrosis at L5-S1 noted.   SOFT TISSUES: No acute abnormality.   IMPRESSION: 1. Mild degenerative disc disease with endplate remodeling throughout the lumbar spine. 2. Facet arthrosis at L5-S1.   Electronically signed by: Dorethia Molt MD 08/05/2024 09:39 PM EDT RP Workstation: [id number]  EXAM: 3 VIEW(S) XRAY OF THE THORACIC SPINE 08/04/2024 10:49:47 AM   COMPARISON: None available.   CLINICAL HISTORY: chronic pain w/o radiculopathy. Back pain x several years, recently pain is worse. NKI.   FINDINGS:   BONES: No acute fracture. No aggressive appearing osseous lesion. Alignment is normal.   DISCS AND DEGENERATIVE CHANGES: Mild endplate remodeling within the upper mid thoracic spine in keeping with mild degenerative disc disease.    SOFT TISSUES: The visualized lungs are clear.   IMPRESSION: 1. Mild degenerative disc disease in the mid thoracic spine.   Electronically signed by: Dorethia Molt MD  08/05/2024 09:40 PM EDT RP Workstation: [id number]  PATIENT SURVEYS:  Modified Oswestry:  MODIFIED OSWESTRY DISABILITY SCALE 10/12/24  Date: 08/25/24 Score   Pain intensity 3 =  Pain medication provides me with moderate relief from pain. 2  2. Personal care (washing, dressing, etc.) 1 =  I can take care of myself normally, but it increases my pain. 0  3. Lifting 1 = I can lift heavy weights, but it causes increased pain. 0  4. Walking 0 = Pain does not prevent me from walking any distance 1  5. Sitting 2 =  Pain prevents me from sitting more than 1 hour. 3  6. Standing 1 =  I can stand as long as I want but, it increases my pain. 3  7. Sleeping 3 =  Even when I take pain medication, I sleep less than 4 hours. 3  8. Social Life 0 = My social life is normal and does not increase my pain. 1  9. Traveling 2 =  My pain restricts my travel over 2 hours. 1  10. Employment/ Homemaking 2 = I can perform most of my homemaking/job duties, but pain prevents me from performing more physically stressful activities (eg, lifting, vacuuming). 1  Total 16/50 = 32% 15/50 = 30%   Interpretation of scores: Score Category Description  0-20% Minimal Disability The patient can cope with most living activities. Usually no treatment is indicated apart from advice on lifting, sitting and exercise  21-40% Moderate Disability The patient experiences more pain and difficulty with sitting, lifting and standing. Travel and social life are more difficult and they may be disabled from work. Personal care, sexual activity and sleeping are not grossly affected, and the patient can usually be managed by conservative means  41-60% Severe Disability Pain remains the main problem in this group, but activities of daily living are affected. These patients require a  detailed investigation  61-80% Crippled Back pain impinges on all aspects of the patients life. Positive intervention is required  81-100% Bed-bound These patients are either bed-bound or exaggerating their symptoms  Bluford FORBES Zoe DELENA Karon DELENA, et al. Surgery versus conservative management of stable thoracolumbar fracture: the PRESTO feasibility RCT. [place] ([place]): [employer]; 2021 Nov. [facility] Technology Assessment, No. 25.62.) Appendix 3, Oswestry Disability Index category descriptors. Available from: [email or web address].cz  Minimally Clinically Important Difference (MCID) = 12.8%  SCREENING FOR RED FLAGS: Bowel or bladder incontinence: No Spinal tumors: No Cauda equina syndrome: No Compression fracture: No Abdominal aneurysm: No  COGNITION:  Overall cognitive status: Within functional limits for tasks assessed    SENSATION: WFL  POSTURE:  Supinated feet, lower R shoulder, leans a little to the R, fairly normal lordosis Supine:  R malleolus is slightly longer, R IC is slightly higher, ASIS appear equal  PALPATION: B paraspinals, L quadratus and L SI, and increased pain with   LUMBAR ROM:   Active  Eval 09/17/24 10/12/24  Flexion Mid shins, no pain Mid shins, no pain To ankles  Extension 50%; tight at end range 75% 85%  Right lateral flexion Mid thigh To knee To mid thigh  Left lateral flexion Upper thigh, pain and stiff To knee To knee  Right rotation 50%, stiff, some pain 60%; end range discomfort 75%  Left rotation 75%, min pain 100%, no pain 100%  (Blank rows = not tested)  MUSCLE LENGTH: Hamstrings: Right SLR = 80 deg; Left SLR = 75 deg Thomas test: Right 0 deg; Left 0 deg Hamstrings: tight  BLE ITB: Tight LLE Piriformis: mild tight BLE Hip flexors:  mild tight BLE Quads: NT Heelcord: NT  LOWER EXTREMITY ROM:     (Blank rows = not tested)  LOWER EXTREMITY MMT:   Active  Right eval Left eval R/L 10/12/24 R/L 10/22/24   Hip flexion 5 5 5/5   Hip extension 3+ 4- 4-/4- 4-/4-  Hip abduction 5 4- 5/4 5/4+  Hip adduction      Hip internal rotation 4+ 4 5/4+   Hip external rotation 5 5 5/5   Knee flexion 5 4 4+/4+ 5/5  Knee extension 5 5 5/5 5/5  Ankle dorsiflexion 5 4 5/4+   Ankle plantarflexion   5/5   Ankle inversion      Ankle eversion       LUMBAR SPECIAL TESTS:  Straight leg raise test: Negative, Slump test: Negative, SI Compression/distraction test: Positive, FABER test: Positive, Gaenslen's test: Negative, and Thomas test: Negative;  FABER is positive on LLE only  FUNCTIONAL TESTS:   Long sit test RLE goes long to short at malleoli;  Standing:  Lower R shoulder;  supinator, L IT was tight, R piriformis causes some back pain with stretch;  R anterior/ L posterior pelvic rotation is a possibility  Long leg traction BLE relieves her back pain considerably  GAIT: Distance walked: into clinic x 150' Assistive device utilized: None Level of assistance: Complete Independence Gait pattern: very hard heel striker with supinatory roll BLE R >L; short quick step gait pattern, minimal trunk rotation Comments:    TODAY'S TREATMENT:  10/22/24 Nustep L5x68min UE/LE Tested LE strength STM to L levator, rhomboids, UT Seated on green swiss ball: Bouncing x 1 min Ant/post x 1 min Lateral WS x 1 min Pelvic circles x both ways Marching BLE x 10 Seated LAQ BLE x 10 Alternating UE/LE raise  10/15/24 TPR to R lumbar paraspinals, QL, glute med; Foam roll IASTM as well  Seated BLE KTOS stretch 2x4min  Counter needle threads B UE x 10 Kneeling over orange pball: I,T,Y x 12 reps each Standing pallof press GTB x 20 each direction Standing side bends 10lb kettle bell B  10/12/24 THERAPEUTIC EXERCISE: To improve strength and endurance.  Demonstration, verbal and tactile cues throughout for technique. Bike L4 x 6'  THERAPEUTIC ACTIVITIES: To improve functional performance.  Demonstration, verbal and  tactile cues throughout for technique. SLR hamstretch x 1' x 3 BLE Supine KTOS piriformis stretch x 1' x 3 BLE Supine hip flexor stretch x 1' x 2 BLE Seated FABER piriformis stretch x 1' BLE Prayer stretch w/ 10 sec holds x 6 Standing calf stretch on 1/2 foam roller x 1';   Soleus x 1' BLE Sidebending latissimus/QL stretch holding doorframe BUE x 20 sec x 2 each side Prone hip extension w/ TA x 2/10 BLE Counter plank w/ TA and thoracic rotation/shoulder HABD x 10 each side Rechecked strength and ROM  09/21/24 Bike L4x54min QL/lat stretch in doorway 2x30 sec IASTM foam roll to R lats with some manual pressure in L S/L  Seated L mod pigeon + R TL rotation 2x30 sec Standing lat pull downs 7lb x 20 with cable  Standing R shoulder ext 7lb with cable x 12; x 8 with 5lb  09/17/24 THERAPEUTIC EXERCISE: To improve strength and endurance.  Demonstration, verbal and tactile cues throughout for technique. NuStep L5 x 5'  NEUROMUSCULAR RE-EDUCATION: To improve coordination, kinesthesia, posture, and proprioception. 65 cm swiss ball: Bouncing EO x 1'; EC x  1'  Marching alternately x 1'  Marching w/ alternate arm raise x 1'  Alternate knee extension x 1'  F/B rolling x 1' Supine w/ LE's on 55 cm swiss ball at 90/90  B shoulder ER RTB x 10  B shoulder HABD RTB x 10  B shoulder D2 flexion PNF x 10 each way  THERAPEUTIC ACTIVITIES: To improve functional performance.  Demonstration, verbal and tactile cues throughout for technique. Leg press 55lb x 20 BLE Standing cable rows 20lb x 20 Standing shoulder extension 20lb x 20 R lateral shift w/ RUE wall slide/reach x 10 R QL stretch in doorway x 30 sec Child's pose stretch x 10 sec x 5 Hooklying curl up and reach x 2/10 BUE  Supine = level malleoli BLE Long sit test = no change  09/14/24 Bike L3x22min Leg press 55lb x 20 BLE Standing cable rows 20lb x 20 Standing shoulder extension 20lb x 20 Sidesteps RTB at ankles 2x Standing hip  abduction RTB 2x10 Standing hip extension RTB 2x10 Supine LTR both ways, DKTC 09/09/24 THERAPEUTIC EXERCISE: To improve strength and endurance.  Demonstration, verbal and tactile cues throughout for technique. Bike L4 x 6'  THERAPEUTIC ACTIVITIES: To improve functional performance.  Demonstration, verbal and tactile cues throughout for technique. Seated hamstretch x 1' x 2 BLE Supine long sit test is only very slight long to short on the RLE;  malleoli are equal in supine Supine scapular depression BUE Supine pelvic depression w/ arms full flexed and relaxed overhead x 15 each; leg one at a time Supine L hip flexor stretch w/ SKTC on RLE x 1' w/ isometric hip extension on RLE--patient reports gets L foot numbness in this position Supine bridge w/ blue TB to knees x 20 BLE Supine SLR ham stretch by PT x 1' x 3 RLE Seated swiss ball rollouts x 10 from 11:00, 12:00, 1:00 Standing RUE wall reach w/ trunk SB stretch to the L x 1' x 2   Modified self MET for anterior R inominate w/ R SKTC w/ isometric hip ext  MANUAL THERAPY: To promote reduced pain utilizing joint mobilization. Long leg traction x 10-20 gentle grade 3-4 reps to each leg individually  MODALITIES: Static pelvic traction at neutral pull from 90/90 position at 77 lbs pull for 155 lb body weight x 5'--tolerates well, feels relief afterwards  09/07/24 NEUROMUSCULAR RE-EDUCATION: To improve coordination, kinesthesia, posture, and proprioception.  Bridges x 30 Supine hip ADD with TRA x 20 Supine hip ABD with TRA x 10 blue TB Bridges blue TB x 20 B hip flexion with ball squeeze x 12 supine MET shotgun technique for pelvic misalignment, RLE was longer after supine to long sit test Able to correct after METs Standing trunk rotation at wall Thread the needle on elevated mat table to L side  THERAPEUTIC EXERCISE: To improve strength, endurance, ROM, and flexibility.  Bike L4x63min LTR both ways x 10 B  09/02/24 NEUROMUSCULAR  RE-EDUCATION: To improve coordination, kinesthesia, posture, and proprioception.  Bridge knees bent BOSU ball 10x5  THERAPEUTIC EXERCISE: To improve strength, endurance, ROM, and flexibility.  Bike L4x56min Seated HS stretch BLE x 1 min Supine BLE thomas stretch x 1 min- increased LBP so taken out of HEP Supine KTOS stretch BLE x 1 min Sidelying hip ABD x 20 BLE  MANUAL THERAPY: To promote normalized muscle tension, improve joint mobility and/or for pain modulation  DTM to B thoracic paraspinals in prone IASTM with foam roll to thoracic paraspinals  Moist  heat in supine to mid back x 8 min  08/25/24 SELF CARE: Provided education on PT POC progression., initial HEP  MODALITIES:   HP/premod ESTIM to B paraspinals from T8 to PSIS at machine default settings x 12-18 mA intensity x 15' in sitting (declined lying on back)   PATIENT EDUCATION:  Education details: adding L sidebending stretch at wall to HEP  Person educated: Patient Education method: Explanation, Demonstration, Verbal cues, Tactile cues, Handouts, and MedBridgeGO app access provided Education comprehension: verbalized understanding, verbal cues required, tactile cues required, and needs further education  HOME EXERCISE PROGRAM: ASSESSMENT:  Access Code: [email or web address] URL: [email or web address] Date: 10/12/2024 Prepared by: Garnette Montclair  Exercises - Supine Bridge  - 1 x daily - 7 x weekly - 3 sets - 10 reps - Hooklying Abduction with Resistance  - 1 x daily - 7 x weekly - 3 sets - 10 reps - Plank with Thoracic Rotation on Counter  - 1 x daily - 7 x weekly - 3 sets - 10 reps - Supine Shoulder External Rotation on Foam Roll with Theraband  - 1 x daily - 7 x weekly - 1 sets - 10 reps - Supine Shoulder External Rotation with Resistance  - 1 x daily - 7 x weekly - 1 sets - 10 reps - Supine Shoulder Horizontal Abduction with Resistance  - 1 x daily - 7 x weekly - 1 sets - 10 reps - Supine PNF D2 Flexion with  Resistance  - 1 x daily - 7 x weekly - 1 sets - 10 reps - Curl Up with Reach  - 1 x daily - 7 x weekly - 2 sets - 10 reps - Child's Pose Stretch  - 1 x daily - 7 x weekly - 1 sets - 5 reps - 10 sec hold - Standing Quadratus Lumborum Stretch with Doorway  - 1 x daily - 7 x weekly - 1 sets - 2 reps - 1 min hold - Standing Gastroc Stretch on Step  - 1 x daily - 7 x weekly - 1 sets - 2 reps - 1 min hold - Seated Hamstring Stretch  - 1 x daily - 7 x weekly - 1 sets - 2 reps - 1 min hold - Seated Table Hamstring Stretch  - 1 x daily - 7 x weekly - 3 sets - 10 reps - Seated Figure 4 Piriformis Stretch  - 1 x daily - 7 x weekly - 1 sets - 2 reps - 1 min hold - Supine Piriformis Stretch with Foot on Ground  - 1 x daily - 7 x weekly - 1 sets - 2 reps   CLINICAL IMPRESSION: Pt arrived noting TTP and demonstrating trigger points in her UT area, this limited her ability to participate so I did MT to this area. Strength has improved a bit from last time, her weakness continues in the gluteus maximus which is crucial for lumbar support. ROM was reassessed last week, showing improvements. PT remains necessary for ROM, strength, functional stability, pain, and HEP deficits.  Continue per POC  EVAL:  Samantha Shepard is a 69 y.o. female who was referred to physical therapy for evaluation and treatment for LBP.   Patient reports onset of LBP pain beginning years ago. Pain is worse with rolling over in bed or twisting in any position.   Xrays showed DDD and facet OA.  She goes to the gym and works with a systems analyst.   She has very good leg  strength with the exception of some L hip weakness.    However, she is very stiff and has some postural deficits.  L5 P/A glides are painful as well as rotational glides in each direction but particularly to the L.  Patient has deficits in lumbar ROM, lumbar/B LE flexibility, L hamstring, hip abductor, and extensor strength, abnormal posture, and TTP with abnormal muscle  tension and pain which are interfering with ADLs and are impacting quality of life.  On Modified Oswestry patient scored 16/50 demonstrating 32% or moderate disability.  Samantha Shepard will benefit from skilled PT to address above deficits to improve mobility and activity tolerance with decreased pain interference.     OBJECTIVE IMPAIRMENTS: difficulty walking, decreased ROM, decreased strength, impaired flexibility, postural dysfunction, and pain.   ACTIVITY LIMITATIONS: lifting, bending, standing, sleeping, and locomotion level  PARTICIPATION LIMITATIONS: cleaning, laundry, and community activity  PERSONAL FACTORS: Age, Time since onset of injury/illness/exacerbation, and 1-2 comorbidities: HTN, hyperlipidemia, osteopenia, prediabetes, CKD 2, R breast intraductal papilloma are also affecting patient's functional outcome.   REHAB POTENTIAL: Good  CLINICAL DECISION MAKING: Evolving/moderate complexity  EVALUATION COMPLEXITY: Moderate   GOALS: Goals reviewed with patient? Yes  SHORT TERM GOALS: Target date: 11/09/2024   Patient will be independent with initial HEP to improve outcomes and carryover.  Baseline: 100% PT assist required for correct completion 09/09/24:  can teach back Goal status: MET  2.  Patient will report 25% improvement in low back pain to improve QOL. Baseline: 8/10 worst 09/09/24: 2/10 today Goal status: MET-  3.  Patient will improve lumbar extension and sidebending to 75% ROM in all planes Baseline: 09/17/24 updated Goal status: MET   LONG TERM GOALS: Target date: 12/07/2024   Patient will be independent with ongoing/advanced HEP for self-management at home.  Baseline: no advanced HEP yet Goal status: INITIAL  2.  Patient will report 50-75% improvement in low back pain to improve QOL.  Baseline: 8/10 10/12/24:  7/10 worst last week;  1/10 today Goal status: IN PROGRESS  3.  Patient to demonstrate ability to achieve and maintain good spinal alignment/posturing  and body mechanics needed for daily activities. Baseline: some R lateral lean/list 10/12/24:  improving Goal status: IN PROGRESS  4.  Patient will demonstrate full pain free lumbar ROM to perform ADLs.   Baseline: Refer to above lumbar ROM table 09/17/24:  see updated tables above 10/12/24:  see updated tables above Goal status: IN PROGRESS  5.  Patient will demonstrate improved BLE strength to >/= 5/5 for improved stability and ease of mobility. Baseline: Refer to above LE MMT table 10/12/24:  updated tables above Goal status: IN PROGRESS- 10/22/24 improving   6. Patient will report </= 20% on Modified Oswestry (MCID = 12%) to demonstrate improved functional ability with decreased pain interference. Baseline: 32% 10/12/24:  30% Goal status: IN PROGRESS  7.  Patient will tolerate 30 min of (standing/sitting/walking) and being in any position in bed w/o increased pain to allow for  improved mobility and activity tolerance. Baseline: pain with twisting in any position, pain sitting unsupported without back support Goal status: IN PROGRESS- can walk for 30 min w/o pain,     PLAN:  PT FREQUENCY: 1-2x/week  PT DURATION: 8 weeks  PLANNED INTERVENTIONS: 97164- PT Re-evaluation, 97750- Physical Performance Testing, 97110-Therapeutic exercises, 97530- Therapeutic activity, [id number]- Neuromuscular re-education, 97535- Self Care, [id number]- Manual therapy, H9716- Electrical stimulation (unattended), [id number]- Ultrasound, 02987- Traction (mechanical), [id number]- Ionotophoresis 4mg /ml Dexamethasone , 79439 (1-2 muscles), 20561 (3+ muscles)- Dry Needling,  Patient/Family education, Taping, Joint mobilization, Spinal mobilization, Cryotherapy, and Moist heat  PLAN FOR NEXT SESSION:  lots of stabilization with TA and/or PPT; quadruped activities;  lots of stretching review;   MFR/modalities to back for pain PRN  Keyion Knack, PT 10/25/2024, 7:20 PM  "

## 2024-10-26 ENCOUNTER — Encounter: Payer: Self-pay | Admitting: Rehabilitation

## 2024-10-26 ENCOUNTER — Ambulatory Visit: Admitting: Rehabilitation

## 2024-10-26 DIAGNOSIS — M5459 Other low back pain: Secondary | ICD-10-CM

## 2024-10-26 DIAGNOSIS — M6281 Muscle weakness (generalized): Secondary | ICD-10-CM

## 2024-10-29 ENCOUNTER — Ambulatory Visit

## 2024-10-29 DIAGNOSIS — M6281 Muscle weakness (generalized): Secondary | ICD-10-CM

## 2024-10-29 DIAGNOSIS — M5459 Other low back pain: Secondary | ICD-10-CM | POA: Diagnosis not present

## 2024-10-29 NOTE — Therapy (Signed)
 " OUTPATIENT PHYSICAL THERAPY THORACOLUMBAR TREATMENT    Patient Name: Samantha Shepard MRN: [id number] DOB:Mar 23, 1956, 69 y.o., female Today's Date: 10/29/2024  END OF SESSION:  PT End of Session - 10/29/24 1205     Visit Number 12    Date for Recertification  12/07/24    Progress Note Due on Visit 18    PT Start Time 1105    PT Stop Time 1147    PT Time Calculation (min) 42 min    Activity Tolerance Patient tolerated treatment well;No increased pain    Behavior During Therapy WFL for tasks assessed/performed                Past Medical History:  Diagnosis Date   Allergy    dust, cats   Anxiety    Asthma    controlled   Hyperlipidemia    Hypertension    Left breast mass    Migraines    Past Surgical History:  Procedure Laterality Date   BREAST BIOPSY  10/17/2022   MM RT RADIOACTIVE SEED LOC MAMMO GUIDE 10/17/2022 [facility]AMMOGRAP[facility]   BREAST EXCISIONAL BIOPSY Left 06/2019   benign   BREAST LUMPECTOMY WITH RADIOACTIVE SEED LOCALIZATION Left 07/08/2019   Procedure: LEFT BREAST LUMPECTOMY WITH RADIOACTIVE SEED LOCALIZATION;  Surgeon: Curvin Deward MOULD, MD;  Location: [facility];  Service: General;  Laterality: Left;   BREAST LUMPECTOMY WITH RADIOACTIVE SEED LOCALIZATION Right 10/18/2022   Procedure: RIGHT BREAST LUMPECTOMY WITH RADIOACTIVE SEED LOCALIZATION;  Surgeon: Curvin Deward III, MD;  Location: [facility];  Service: General;  Laterality: Right;   COLONOSCOPY  2009   neg; [facility]   thyroid  cyst aspirated     TONSILLECTOMY     Patient Active Problem List   Diagnosis Date Noted   Chronic lower back pain 08/04/2024   CKD (chronic kidney disease) stage 2, GFR 60-89 ml/min 02/03/2024   Not well controlled moderate persistent asthma 09/18/2022   Intraductal papilloma of right breast 08/20/2022   Agatston coronary artery calcium score of 1,   03/2022 03/17/2022   Gastroesophageal reflux disease 08/31/2020   Cough 08/31/2020    Sleep difficulties 07/29/2020   Temporomandibular joint (TMJ) pain 06/16/2018   Anxiety 06/16/2018   Cervical adenopathy 06/16/2018   Jaw deformity 06/16/2018   Prediabetes 08/01/2017   Tingling of both feet 04/18/2016   Osteopenia 01/15/2016   Mild persistent asthma 01/10/2016   Other allergic rhinitis 01/10/2016   Hyperlipidemia 08/13/2008   Hypertension 09/22/2007   Allergic rhinitis 05/26/2007    PCP: Geofm Glade PARAS, MD   REFERRING PROVIDER: Geofm Glade PARAS, MD   REFERRING DIAG: M54.50,G89.29 (ICD-10-CM) - Chronic bilateral low back pain without sciatica  THERAPY DIAG:  Other low back pain  Muscle weakness (generalized)  RATIONALE FOR EVALUATION AND TREATMENT: Rehabilitation  ONSET DATE: chronic, years ago  NEXT MD VISIT:    SUBJECTIVE:  SUBJECTIVE STATEMENT: Patient is having more upper back and upper trap pain. States she still has the lower back pain, but it is doing better.  Bigger issue is upper back.  States treatment last time did help.    69 y/o patient referred to PT for chronic LBP.  States she has had pain for many years in her low back.  Denies any chiropractic or injections.   Has had xrays that showed DDD and facet OA.   She gets pain sitting upright without a back support on her chair.   She cannot tolerate sititng for more than a few minutes on the mat table today for the evaluation visit and has to sit in a chair with a back.   Rolling in bed and any twisting motions are most painful.   Ironing also increases pain.   Bending and doing laundry is ok. Very active, goes to [facility] gym and has a systems analyst.   Walks 2.5 to 5 miles daily.   Does standing back rotation with cable cross over machine and it hurt today.  Does planks and can hold for 1 min with her  trainer.   Lives with spouse who is 56 y/o, but she does not have to do any personal care as he is mobile and healthy.    PAIN: Are you having pain? Yes: NPRS scale: 3/10 now;  1/10 best over last week Pain location: central low back Pain description: aching, sore Aggravating factors: rolling over in bed Relieving factors: heat, not rolling in bed  PERTINENT HISTORY:  HTN, hyperlipidemia, osteopenia, prediabetes, CKD 2, R breast intraductal papilloma  PRECAUTIONS: None  RED FLAGS: None  WEIGHT BEARING RESTRICTIONS: No  FALLS:  Has patient fallen in last 6 months? No  LIVING ENVIRONMENT: Lives with: lives with their family and lives with their spouse Lives in: House/apartment Stairs: Yes: External: 1 steps; none Has following equipment at home: None  OCCUPATION: retired surveyor, quantity  PLOF: Independent with gait  PATIENT GOALS: have less back pain and be able to move in bed without pain   OBJECTIVE: (objective measures completed at initial evaluation unless otherwise dated)  DIAGNOSTIC FINDINGS:  EXAM: 4 VIEW(S) XRAY OF THE LUMBAR SPINE 08/04/2024 10:49:47 AM   COMPARISON: None available.   CLINICAL HISTORY: chronic pain w/o radiculopathy. Back pain x several years, recently pain is worse. NKI.   FINDINGS:   LUMBAR SPINE:   BONES: No acute fracture. No aggressive appearing osseous lesion. Alignment is normal.   DISCS AND DEGENERATIVE CHANGES: Mild endplate remodeling is seen throughout the lumbar spine in keeping with changes of mild degenerative disc disease. Facet arthrosis at L5-S1 noted.   SOFT TISSUES: No acute abnormality.   IMPRESSION: 1. Mild degenerative disc disease with endplate remodeling throughout the lumbar spine. 2. Facet arthrosis at L5-S1.   Electronically signed by: Dorethia Molt MD 08/05/2024 09:39 PM EDT RP Workstation: [id number]  EXAM: 3 VIEW(S) XRAY OF THE THORACIC SPINE 08/04/2024 10:49:47 AM    COMPARISON: None available.   CLINICAL HISTORY: chronic pain w/o radiculopathy. Back pain x several years, recently pain is worse. NKI.   FINDINGS:   BONES: No acute fracture. No aggressive appearing osseous lesion. Alignment is normal.   DISCS AND DEGENERATIVE CHANGES: Mild endplate remodeling within the upper mid thoracic spine in keeping with mild degenerative disc disease.   SOFT TISSUES: The visualized lungs are clear.   IMPRESSION: 1. Mild degenerative disc disease in the mid thoracic spine.  Electronically signed by: Dorethia Molt MD 08/05/2024 09:40 PM EDT RP Workstation: [id number]  PATIENT SURVEYS:  Modified Oswestry:  MODIFIED OSWESTRY DISABILITY SCALE 10/12/24  Date: 08/25/24 Score   Pain intensity 3 =  Pain medication provides me with moderate relief from pain. 2  2. Personal care (washing, dressing, etc.) 1 =  I can take care of myself normally, but it increases my pain. 0  3. Lifting 1 = I can lift heavy weights, but it causes increased pain. 0  4. Walking 0 = Pain does not prevent me from walking any distance 1  5. Sitting 2 =  Pain prevents me from sitting more than 1 hour. 3  6. Standing 1 =  I can stand as long as I want but, it increases my pain. 3  7. Sleeping 3 =  Even when I take pain medication, I sleep less than 4 hours. 3  8. Social Life 0 = My social life is normal and does not increase my pain. 1  9. Traveling 2 =  My pain restricts my travel over 2 hours. 1  10. Employment/ Homemaking 2 = I can perform most of my homemaking/job duties, but pain prevents me from performing more physically stressful activities (eg, lifting, vacuuming). 1  Total 16/50 = 32% 15/50 = 30%   Interpretation of scores: Score Category Description  0-20% Minimal Disability The patient can cope with most living activities. Usually no treatment is indicated apart from advice on lifting, sitting and exercise  21-40% Moderate Disability The patient experiences more pain and  difficulty with sitting, lifting and standing. Travel and social life are more difficult and they may be disabled from work. Personal care, sexual activity and sleeping are not grossly affected, and the patient can usually be managed by conservative means  41-60% Severe Disability Pain remains the main problem in this group, but activities of daily living are affected. These patients require a detailed investigation  61-80% Crippled Back pain impinges on all aspects of the patients life. Positive intervention is required  81-100% Bed-bound These patients are either bed-bound or exaggerating their symptoms  Bluford FORBES Zoe DELENA Karon DELENA, et al. Surgery versus conservative management of stable thoracolumbar fracture: the PRESTO feasibility RCT. [place] ([place]): [employer]; 2021 Nov. [facility] Technology Assessment, No. 25.62.) Appendix 3, Oswestry Disability Index category descriptors. Available from: [email or web address].cz  Minimally Clinically Important Difference (MCID) = 12.8%  SCREENING FOR RED FLAGS: Bowel or bladder incontinence: No Spinal tumors: No Cauda equina syndrome: No Compression fracture: No Abdominal aneurysm: No  COGNITION:  Overall cognitive status: Within functional limits for tasks assessed    SENSATION: WFL  POSTURE:  Supinated feet, lower R shoulder, leans a little to the R, fairly normal lordosis Supine:  R malleolus is slightly longer, R IC is slightly higher, ASIS appear equal  PALPATION: B paraspinals, L quadratus and L SI, and increased pain with   LUMBAR ROM:   Active  Eval 09/17/24 10/12/24  Flexion Mid shins, no pain Mid shins, no pain To ankles  Extension 50%; tight at end range 75% 85%  Right lateral flexion Mid thigh To knee To mid thigh  Left lateral flexion Upper thigh, pain and stiff To knee To knee  Right rotation 50%, stiff, some pain 60%; end range discomfort 75%  Left rotation 75%, min pain 100%, no pain 100%   (Blank rows = not tested)  MUSCLE LENGTH: Hamstrings: Right SLR = 80 deg; Left SLR = 75 deg Thomas test: Right  0 deg; Left 0 deg Hamstrings: tight BLE ITB: Tight LLE Piriformis: mild tight BLE Hip flexors:  mild tight BLE Quads: NT Heelcord: NT  LOWER EXTREMITY ROM:     (Blank rows = not tested)  LOWER EXTREMITY MMT:   Active  Right eval Left eval R/L 10/12/24 R/L 10/22/24  Hip flexion 5 5 5/5   Hip extension 3+ 4- 4-/4- 4-/4-  Hip abduction 5 4- 5/4 5/4+  Hip adduction      Hip internal rotation 4+ 4 5/4+   Hip external rotation 5 5 5/5   Knee flexion 5 4 4+/4+ 5/5  Knee extension 5 5 5/5 5/5  Ankle dorsiflexion 5 4 5/4+   Ankle plantarflexion   5/5   Ankle inversion      Ankle eversion       LUMBAR SPECIAL TESTS:  Straight leg raise test: Negative, Slump test: Negative, SI Compression/distraction test: Positive, FABER test: Positive, Gaenslen's test: Negative, and Thomas test: Negative;  FABER is positive on LLE only  FUNCTIONAL TESTS:   Long sit test RLE goes long to short at malleoli;  Standing:  Lower R shoulder;  supinator, L IT was tight, R piriformis causes some back pain with stretch;  R anterior/ L posterior pelvic rotation is a possibility  Long leg traction BLE relieves her back pain considerably  GAIT: Distance walked: into clinic x 150' Assistive device utilized: None Level of assistance: Complete Independence Gait pattern: very hard heel striker with supinatory roll BLE R >L; short quick step gait pattern, minimal trunk rotation Comments:    TODAY'S TREATMENT:  10/29/24 Bike L4x92min S/L hip abduction 2x10 CW/CCW circles 2lb BLE  Lat pull 20lb x 20 B Seated rows 25lb x 20 B low grips; higher grips 20lb x 20 Counter push ups 2x10 Supine chin tucks 10x5 sec; 2 sets Supine chin tuck with L shoulder CW/CCW circles 2x Supine L shoulder serratus punch x 20 Bridges on BOSU ball 2x10 Quadruped ab sets 10x5 sec Quadruped arm raises    10/26/24 THERAPEUTIC EXERCISE: To improve strength and endurance.  Demonstration, verbal and tactile cues throughout for technique. UBE L1 x 6' backward  THERAPEUTIC ACTIVITIES: To improve functional performance.  Demonstration, verbal and tactile cues throughout for technique. Seated rowing low grip 25# x 20 BUE; high grip/high elbows for middle trap 20# x 20 BUE Seated Lat PD 25# x 20 BUE Supine cervical retraction w/ 3 sec holds x 20  Supine scapular depression w/ 3 sec holds x 20 BUE Supine scapular retraction w/ 3 sec holds x 20 BUE  Reviewed from HEP Supine shoulder ER RTB  x 20 BUE Supine shoulder HABD x 20 BUE KTOS piriformis stretch x 1' x 2 BLE  On foam roller: Shoulder ER RTB x 20 BUE Shoulder HABD RTB x 20 BUE Snow angel x 10 BUE T pec stretch x 1' BUE  MANUAL THERAPY: To promote reduced pain utilizing percussion massage with massage gun. Massage gun to L levator, middle trap, infraspinatus   10/22/24 Nustep L5x62min UE/LE Tested LE strength STM to L levator, rhomboids, UT Seated on green swiss ball: Bouncing x 1 min Ant/post x 1 min Lateral WS x 1 min Pelvic circles x both ways Marching BLE x 10 Seated LAQ BLE x 10 Alternating UE/LE raise  10/15/24 TPR to R lumbar paraspinals, QL, glute med; Foam roll IASTM as well  Seated BLE KTOS stretch 2x47min  Counter needle threads B UE x 10 Kneeling over orange pball: I,T,Y  x 12 reps each Standing pallof press GTB x 20 each direction Standing side bends 10lb kettle bell B  10/12/24 THERAPEUTIC EXERCISE: To improve strength and endurance.  Demonstration, verbal and tactile cues throughout for technique. Bike L4 x 6'  THERAPEUTIC ACTIVITIES: To improve functional performance.  Demonstration, verbal and tactile cues throughout for technique. SLR hamstretch x 1' x 3 BLE Supine KTOS piriformis stretch x 1' x 3 BLE Supine hip flexor stretch x 1' x 2 BLE Seated FABER piriformis stretch x 1' BLE Prayer stretch w/  10 sec holds x 6 Standing calf stretch on 1/2 foam roller x 1';   Soleus x 1' BLE Sidebending latissimus/QL stretch holding doorframe BUE x 20 sec x 2 each side Prone hip extension w/ TA x 2/10 BLE Counter plank w/ TA and thoracic rotation/shoulder HABD x 10 each side Rechecked strength and ROM  09/21/24 Bike L4x3min QL/lat stretch in doorway 2x30 sec IASTM foam roll to R lats with some manual pressure in L S/L  Seated L mod pigeon + R TL rotation 2x30 sec Standing lat pull downs 7lb x 20 with cable  Standing R shoulder ext 7lb with cable x 12; x 8 with 5lb  09/17/24 THERAPEUTIC EXERCISE: To improve strength and endurance.  Demonstration, verbal and tactile cues throughout for technique. NuStep L5 x 5'  NEUROMUSCULAR RE-EDUCATION: To improve coordination, kinesthesia, posture, and proprioception. 65 cm swiss ball: Bouncing EO x 1'; EC x 1'  Marching alternately x 1'  Marching w/ alternate arm raise x 1'  Alternate knee extension x 1'  F/B rolling x 1' Supine w/ LE's on 55 cm swiss ball at 90/90  B shoulder ER RTB x 10  B shoulder HABD RTB x 10  B shoulder D2 flexion PNF x 10 each way  THERAPEUTIC ACTIVITIES: To improve functional performance.  Demonstration, verbal and tactile cues throughout for technique. Leg press 55lb x 20 BLE Standing cable rows 20lb x 20 Standing shoulder extension 20lb x 20 R lateral shift w/ RUE wall slide/reach x 10 R QL stretch in doorway x 30 sec Child's pose stretch x 10 sec x 5 Hooklying curl up and reach x 2/10 BUE  Supine = level malleoli BLE Long sit test = no change  09/14/24 Bike L3x18min Leg press 55lb x 20 BLE Standing cable rows 20lb x 20 Standing shoulder extension 20lb x 20 Sidesteps RTB at ankles 2x Standing hip abduction RTB 2x10 Standing hip extension RTB 2x10 Supine LTR both ways, DKTC 09/09/24 THERAPEUTIC EXERCISE: To improve strength and endurance.  Demonstration, verbal and tactile cues throughout for technique. Bike L4  x 6'  THERAPEUTIC ACTIVITIES: To improve functional performance.  Demonstration, verbal and tactile cues throughout for technique. Seated hamstretch x 1' x 2 BLE Supine long sit test is only very slight long to short on the RLE;  malleoli are equal in supine Supine scapular depression BUE Supine pelvic depression w/ arms full flexed and relaxed overhead x 15 each; leg one at a time Supine L hip flexor stretch w/ SKTC on RLE x 1' w/ isometric hip extension on RLE--patient reports gets L foot numbness in this position Supine bridge w/ blue TB to knees x 20 BLE Supine SLR ham stretch by PT x 1' x 3 RLE Seated swiss ball rollouts x 10 from 11:00, 12:00, 1:00 Standing RUE wall reach w/ trunk SB stretch to the L x 1' x 2   Modified self MET for anterior R inominate w/ R SKTC w/  isometric hip ext  MANUAL THERAPY: To promote reduced pain utilizing joint mobilization. Long leg traction x 10-20 gentle grade 3-4 reps to each leg individually  MODALITIES: Static pelvic traction at neutral pull from 90/90 position at 77 lbs pull for 155 lb body weight x 5'--tolerates well, feels relief afterwards  09/07/24 NEUROMUSCULAR RE-EDUCATION: To improve coordination, kinesthesia, posture, and proprioception.  Bridges x 30 Supine hip ADD with TRA x 20 Supine hip ABD with TRA x 10 blue TB Bridges blue TB x 20 B hip flexion with ball squeeze x 12 supine MET shotgun technique for pelvic misalignment, RLE was longer after supine to long sit test Able to correct after METs Standing trunk rotation at wall Thread the needle on elevated mat table to L side  THERAPEUTIC EXERCISE: To improve strength, endurance, ROM, and flexibility.  Bike L4x31min LTR both ways x 10 B  09/02/24 NEUROMUSCULAR RE-EDUCATION: To improve coordination, kinesthesia, posture, and proprioception.  Bridge knees bent BOSU ball 10x5  THERAPEUTIC EXERCISE: To improve strength, endurance, ROM, and flexibility.  Bike L4x41min Seated HS  stretch BLE x 1 min Supine BLE thomas stretch x 1 min- increased LBP so taken out of HEP Supine KTOS stretch BLE x 1 min Sidelying hip ABD x 20 BLE  MANUAL THERAPY: To promote normalized muscle tension, improve joint mobility and/or for pain modulation  DTM to B thoracic paraspinals in prone IASTM with foam roll to thoracic paraspinals  Moist heat in supine to mid back x 8 min  08/25/24 SELF CARE: Provided education on PT POC progression., initial HEP  MODALITIES:   HP/premod ESTIM to B paraspinals from T8 to PSIS at machine default settings x 12-18 mA intensity x 15' in sitting (declined lying on back)   PATIENT EDUCATION:  Education details: adding L sidebending stretch at wall to HEP  Person educated: Patient Education method: Explanation, Demonstration, Verbal cues, Tactile cues, Handouts, and MedBridgeGO app access provided Education comprehension: verbalized understanding, verbal cues required, tactile cues required, and needs further education  HOME EXERCISE PROGRAM: ASSESSMENT:  Access Code: [email or web address] URL: [email or web address] Date: 10/29/2024 Prepared by: Deanne Bedgood  Exercises - Supine Bridge  - 1 x daily - 7 x weekly - 3 sets - 10 reps - Hooklying Abduction with Resistance  - 1 x daily - 7 x weekly - 3 sets - 10 reps - Plank with Thoracic Rotation on Counter  - 1 x daily - 7 x weekly - 3 sets - 10 reps - Supine Shoulder External Rotation on Foam Roll with Theraband  - 1 x daily - 7 x weekly - 1 sets - 10 reps - Supine Shoulder Horizontal Abduction with Resistance  - 1 x daily - 7 x weekly - 1 sets - 10 reps - Supine PNF D2 Flexion with Resistance  - 1 x daily - 7 x weekly - 1 sets - 10 reps - Curl Up with Reach  - 1 x daily - 7 x weekly - 2 sets - 10 reps - Child's Pose Stretch  - 1 x daily - 7 x weekly - 1 sets - 5 reps - 10 sec hold - Standing Quadratus Lumborum Stretch with Doorway  - 1 x daily - 7 x weekly - 1 sets - 2 reps - 1 min hold -  Standing Gastroc Stretch on Step  - 1 x daily - 7 x weekly - 1 sets - 2 reps - 1 min hold - Seated Hamstring Stretch  - 1 x daily - 7  x weekly - 1 sets - 2 reps - 1 min hold - Seated Table Hamstring Stretch  - 1 x daily - 7 x weekly - 3 sets - 10 reps - Seated Figure 4 Piriformis Stretch  - 1 x daily - 7 x weekly - 1 sets - 2 reps - 1 min hold - Supine Piriformis Stretch with Foot on Ground  - 1 x daily - 7 x weekly - 1 sets - 2 reps - Supine Chin Tuck  - 1 x daily - 7 x weekly - 3 sets - 10 reps - Standing Cervical Retraction  - 1 x daily - 7 x weekly - 3 sets - 10 reps - Quadruped Alternating Arm Lift  - 1 x daily - 7 x weekly - 3 sets - 10 reps - Quadruped Transversus Abdominis Bracing  - 1 x daily - 7 x weekly - 3 sets - 10 reps  CLINICAL IMPRESSION: Patient not reporting any LBP but more L sided neck pain and some shoulder pain. She was able to complete the interventions today without complaints. She was more challenged with the quadruped exercises d/t UE support. PT remains necessary for pain, weakness, postural deficits, and flexibility deficits.   Continue per POc  EVAL:  Maleeyah Mccaughey is a 69 y.o. female who was referred to physical therapy for evaluation and treatment for LBP.   Patient reports onset of LBP pain beginning years ago. Pain is worse with rolling over in bed or twisting in any position.   Xrays showed DDD and facet OA.  She goes to the gym and works with a systems analyst.   She has very good leg strength with the exception of some L hip weakness.    However, she is very stiff and has some postural deficits.  L5 P/A glides are painful as well as rotational glides in each direction but particularly to the L.  Patient has deficits in lumbar ROM, lumbar/B LE flexibility, L hamstring, hip abductor, and extensor strength, abnormal posture, and TTP with abnormal muscle tension and pain which are interfering with ADLs and are impacting quality of life.  On Modified Oswestry  patient scored 16/50 demonstrating 32% or moderate disability.  Bao will benefit from skilled PT to address above deficits to improve mobility and activity tolerance with decreased pain interference.     OBJECTIVE IMPAIRMENTS: difficulty walking, decreased ROM, decreased strength, impaired flexibility, postural dysfunction, and pain.   ACTIVITY LIMITATIONS: lifting, bending, standing, sleeping, and locomotion level  PARTICIPATION LIMITATIONS: cleaning, laundry, and community activity  PERSONAL FACTORS: Age, Time since onset of injury/illness/exacerbation, and 1-2 comorbidities: HTN, hyperlipidemia, osteopenia, prediabetes, CKD 2, R breast intraductal papilloma are also affecting patient's functional outcome.   REHAB POTENTIAL: Good  CLINICAL DECISION MAKING: Evolving/moderate complexity  EVALUATION COMPLEXITY: Moderate   GOALS: Goals reviewed with patient? Yes  SHORT TERM GOALS: Target date: 11/09/2024   Patient will be independent with initial HEP to improve outcomes and carryover.  Baseline: 100% PT assist required for correct completion 09/09/24:  can teach back Goal status: MET  2.  Patient will report 25% improvement in low back pain to improve QOL. Baseline: 8/10 worst 09/09/24: 2/10 today Goal status: MET-  3.  Patient will improve lumbar extension and sidebending to 75% ROM in all planes Baseline: 09/17/24 updated Goal status: MET   LONG TERM GOALS: Target date: 12/07/2024   Patient will be independent with ongoing/advanced HEP for self-management at home.  Baseline: no advanced HEP yet 10/26/24:  HEP has been updated a lot over last several weeks, but patient is not independent with it and needs review Goal status: IN PROGRESS  2.  Patient will report 50-75% improvement in low back pain to improve QOL.  Baseline: 8/10 10/12/24:  7/10 worst last week;  1/10 today Goal status: PARTIALLY MET- 10/29/24  3.  Patient to demonstrate ability to achieve and maintain good  spinal alignment/posturing and body mechanics needed for daily activities. Baseline: some R lateral lean/list 10/12/24:  improving Goal status: IN PROGRESS  4.  Patient will demonstrate full pain free lumbar ROM to perform ADLs.   Baseline: Refer to above lumbar ROM table 09/17/24:  see updated tables above 10/12/24:  see updated tables above Goal status: IN PROGRESS  5.  Patient will demonstrate improved BLE strength to >/= 5/5 for improved stability and ease of mobility. Baseline: Refer to above LE MMT table 10/12/24:  updated tables above Goal status: IN PROGRESS- 10/22/24 improving   6. Patient will report </= 20% on Modified Oswestry (MCID = 12%) to demonstrate improved functional ability with decreased pain interference. Baseline: 32% 10/12/24:  30% Goal status: IN PROGRESS  7.  Patient will tolerate 30 min of (standing/sitting/walking) and being in any position in bed w/o increased pain to allow for  improved mobility and activity tolerance. Baseline: pain with twisting in any position, pain sitting unsupported without back support Goal status: IN PROGRESS- can walk for 30 min w/o pain,     PLAN:  PT FREQUENCY: 1-2x/week  PT DURATION: 8 weeks  PLANNED INTERVENTIONS: 97164- PT Re-evaluation, 97750- Physical Performance Testing, 97110-Therapeutic exercises, 97530- Therapeutic activity, [id number]- Neuromuscular re-education, 97535- Self Care, 02859- Manual therapy, G0283- Electrical stimulation (unattended), [id number]- Ultrasound, 02987- Traction (mechanical), [id number]- Ionotophoresis 4mg /ml Dexamethasone , 79439 (1-2 muscles), 20561 (3+ muscles)- Dry Needling, Patient/Family education, Taping, Joint mobilization, Spinal mobilization, Cryotherapy, and Moist heat  PLAN FOR NEXT SESSION:  lots of stabilization with TA and/or PPT; quadruped activities;  lots of stretching review;   MFR/modalities to back for pain PRN  Sol LITTIE Gaskins, PTA 10/29/2024, 12:05 PM  "

## 2024-11-02 ENCOUNTER — Ambulatory Visit: Admitting: Rehabilitation

## 2024-11-04 ENCOUNTER — Ambulatory Visit

## 2024-11-04 NOTE — Therapy (Incomplete)
 " OUTPATIENT PHYSICAL THERAPY THORACOLUMBAR TREATMENT    Patient Name: Samantha Shepard MRN: [id number] DOB:09-27-1956, 69 y.o., female Today's Date: 11/04/2024  END OF SESSION:          Past Medical History:  Diagnosis Date   Allergy    dust, cats   Anxiety    Asthma    controlled   Hyperlipidemia    Hypertension    Left breast mass    Migraines    Past Surgical History:  Procedure Laterality Date   BREAST BIOPSY  10/17/2022   MM RT RADIOACTIVE SEED LOC MAMMO GUIDE 10/17/2022 [facility] MAMMOGRAPHY   BREAST EXCISIONAL BIOPSY Left 06/2019   benign   BREAST LUMPECTOMY WITH RADIOACTIVE SEED LOCALIZATION Left 07/08/2019   Procedure: LEFT BREAST LUMPECTOMY WITH RADIOACTIVE SEED LOCALIZATION;  Surgeon: Curvin Deward MOULD, MD;  Location: [facility];  Service: General;  Laterality: Left;   BREAST LUMPECTOMY WITH RADIOACTIVE SEED LOCALIZATION Right 10/18/2022   Procedure: RIGHT BREAST LUMPECTOMY WITH RADIOACTIVE SEED LOCALIZATION;  Surgeon: Curvin Deward MOULD, MD;  Location: [facility];  Service: General;  Laterality: Right;   COLONOSCOPY  2009   neg; [facility]   thyroid  cyst aspirated     TONSILLECTOMY     Patient Active Problem List   Diagnosis Date Noted   Chronic lower back pain 08/04/2024   CKD (chronic kidney disease) stage 2, GFR 60-89 ml/min 02/03/2024   Not well controlled moderate persistent asthma 09/18/2022   Intraductal papilloma of right breast 08/20/2022   Agatston coronary artery calcium score of 1,   03/2022 03/17/2022   Gastroesophageal reflux disease 08/31/2020   Cough 08/31/2020   Sleep difficulties 07/29/2020   Temporomandibular joint (TMJ) pain 06/16/2018   Anxiety 06/16/2018   Cervical adenopathy 06/16/2018   Jaw deformity 06/16/2018   Prediabetes 08/01/2017   Tingling of both feet 04/18/2016   Osteopenia 01/15/2016   Mild persistent asthma 01/10/2016   Other allergic rhinitis 01/10/2016   Hyperlipidemia 08/13/2008    Hypertension 09/22/2007   Allergic rhinitis 05/26/2007    PCP: Geofm Glade PARAS, MD   REFERRING PROVIDER: Geofm Glade PARAS, MD   REFERRING DIAG: M54.50,G89.29 (ICD-10-CM) - Chronic bilateral low back pain without sciatica  THERAPY DIAG:  No diagnosis found.  RATIONALE FOR EVALUATION AND TREATMENT: Rehabilitation  ONSET DATE: chronic, years ago  NEXT MD VISIT:    SUBJECTIVE:  SUBJECTIVE STATEMENT: Patient is having more upper back and upper trap pain. States she still has the lower back pain, but it is doing better.  Bigger issue is upper back.  States treatment last time did help.    69 y/o patient referred to PT for chronic LBP.  States she has had pain for many years in her low back.  Denies any chiropractic or injections.   Has had xrays that showed DDD and facet OA.   She gets pain sitting upright without a back support on her chair.   She cannot tolerate sititng for more than a few minutes on the mat table today for the evaluation visit and has to sit in a chair with a back.   Rolling in bed and any twisting motions are most painful.   Ironing also increases pain.   Bending and doing laundry is ok. Very active, goes to [facility] hospital gym and has a systems analyst.   Walks 2.5 to 5 miles daily.   Does standing back rotation with cable cross over machine and it hurt today.  Does planks and can hold for 1 min with her trainer.   Lives with spouse who is 93 y/o, but she does not have to do any personal care as he is mobile and healthy.    PAIN: Are you having pain? Yes: NPRS scale: 3/10 now;  1/10 best over last week Pain location: central low back Pain description: aching, sore Aggravating factors: rolling over in bed Relieving factors: heat, not rolling in bed  PERTINENT HISTORY:  HTN,  hyperlipidemia, osteopenia, prediabetes, CKD 2, R breast intraductal papilloma  PRECAUTIONS: None  RED FLAGS: None  WEIGHT BEARING RESTRICTIONS: No  FALLS:  Has patient fallen in last 6 months? No  LIVING ENVIRONMENT: Lives with: lives with their family and lives with their spouse Lives in: House/apartment Stairs: Yes: External: 1 steps; none Has following equipment at home: None  OCCUPATION: retired surveyor, quantity  PLOF: Independent with gait  PATIENT GOALS: have less back pain and be able to move in bed without pain   OBJECTIVE: (objective measures completed at initial evaluation unless otherwise dated)  DIAGNOSTIC FINDINGS:  EXAM: 4 VIEW(S) XRAY OF THE LUMBAR SPINE 08/04/2024 10:49:47 AM   COMPARISON: None available.   CLINICAL HISTORY: chronic pain w/o radiculopathy. Back pain x several years, recently pain is worse. NKI.   FINDINGS:   LUMBAR SPINE:   BONES: No acute fracture. No aggressive appearing osseous lesion. Alignment is normal.   DISCS AND DEGENERATIVE CHANGES: Mild endplate remodeling is seen throughout the lumbar spine in keeping with changes of mild degenerative disc disease. Facet arthrosis at L5-S1 noted.   SOFT TISSUES: No acute abnormality.   IMPRESSION: 1. Mild degenerative disc disease with endplate remodeling throughout the lumbar spine. 2. Facet arthrosis at L5-S1.   Electronically signed by: Dorethia Molt MD 08/05/2024 09:39 PM EDT RP Workstation: [id number]  EXAM: 3 VIEW(S) XRAY OF THE THORACIC SPINE 08/04/2024 10:49:47 AM   COMPARISON: None available.   CLINICAL HISTORY: chronic pain w/o radiculopathy. Back pain x several years, recently pain is worse. NKI.   FINDINGS:   BONES: No acute fracture. No aggressive appearing osseous lesion. Alignment is normal.   DISCS AND DEGENERATIVE CHANGES: Mild endplate remodeling within the upper mid thoracic spine in keeping with mild degenerative disc disease.    SOFT TISSUES: The visualized lungs are clear.   IMPRESSION: 1. Mild degenerative disc disease in the mid thoracic spine.  Electronically signed by: Dorethia Molt MD 08/05/2024 09:40 PM EDT RP Workstation: [id number]  PATIENT SURVEYS:  Modified Oswestry:  MODIFIED OSWESTRY DISABILITY SCALE 10/12/24  Date: 08/25/24 Score   Pain intensity 3 =  Pain medication provides me with moderate relief from pain. 2  2. Personal care (washing, dressing, etc.) 1 =  I can take care of myself normally, but it increases my pain. 0  3. Lifting 1 = I can lift heavy weights, but it causes increased pain. 0  4. Walking 0 = Pain does not prevent me from walking any distance 1  5. Sitting 2 =  Pain prevents me from sitting more than 1 hour. 3  6. Standing 1 =  I can stand as long as I want but, it increases my pain. 3  7. Sleeping 3 =  Even when I take pain medication, I sleep less than 4 hours. 3  8. Social Life 0 = My social life is normal and does not increase my pain. 1  9. Traveling 2 =  My pain restricts my travel over 2 hours. 1  10. Employment/ Homemaking 2 = I can perform most of my homemaking/job duties, but pain prevents me from performing more physically stressful activities (eg, lifting, vacuuming). 1  Total 16/50 = 32% 15/50 = 30%   Interpretation of scores: Score Category Description  0-20% Minimal Disability The patient can cope with most living activities. Usually no treatment is indicated apart from advice on lifting, sitting and exercise  21-40% Moderate Disability The patient experiences more pain and difficulty with sitting, lifting and standing. Travel and social life are more difficult and they may be disabled from work. Personal care, sexual activity and sleeping are not grossly affected, and the patient can usually be managed by conservative means  41-60% Severe Disability Pain remains the main problem in this group, but activities of daily living are affected. These patients require a  detailed investigation  61-80% Crippled Back pain impinges on all aspects of the patients life. Positive intervention is required  81-100% Bed-bound These patients are either bed-bound or exaggerating their symptoms  Bluford FORBES Zoe DELENA Karon DELENA, et al. Surgery versus conservative management of stable thoracolumbar fracture: the PRESTO feasibility RCT. [place] ([place]): [employer]; 2021 Nov. [facility] Technology Assessment, No. 25.62.) Appendix 3, Oswestry Disability Index category descriptors. Available from: [email or web address].cz  Minimally Clinically Important Difference (MCID) = 12.8%  SCREENING FOR RED FLAGS: Bowel or bladder incontinence: No Spinal tumors: No Cauda equina syndrome: No Compression fracture: No Abdominal aneurysm: No  COGNITION:  Overall cognitive status: Within functional limits for tasks assessed    SENSATION: WFL  POSTURE:  Supinated feet, lower R shoulder, leans a little to the R, fairly normal lordosis Supine:  R malleolus is slightly longer, R IC is slightly higher, ASIS appear equal  PALPATION: B paraspinals, L quadratus and L SI, and increased pain with   LUMBAR ROM:   Active  Eval 09/17/24 10/12/24  Flexion Mid shins, no pain Mid shins, no pain To ankles  Extension 50%; tight at end range 75% 85%  Right lateral flexion Mid thigh To knee To mid thigh  Left lateral flexion Upper thigh, pain and stiff To knee To knee  Right rotation 50%, stiff, some pain 60%; end range discomfort 75%  Left rotation 75%, min pain 100%, no pain 100%  (Blank rows = not tested)  MUSCLE LENGTH: Hamstrings: Right SLR = 80 deg; Left SLR = 75 deg Thomas test: Right 0  deg; Left 0 deg Hamstrings: tight BLE ITB: Tight LLE Piriformis: mild tight BLE Hip flexors:  mild tight BLE Quads: NT Heelcord: NT  LOWER EXTREMITY ROM:     (Blank rows = not tested)  LOWER EXTREMITY MMT:   Active  Right eval Left eval R/L 10/12/24 R/L 10/22/24   Hip flexion 5 5 5/5   Hip extension 3+ 4- 4-/4- 4-/4-  Hip abduction 5 4- 5/4 5/4+  Hip adduction      Hip internal rotation 4+ 4 5/4+   Hip external rotation 5 5 5/5   Knee flexion 5 4 4+/4+ 5/5  Knee extension 5 5 5/5 5/5  Ankle dorsiflexion 5 4 5/4+   Ankle plantarflexion   5/5   Ankle inversion      Ankle eversion       LUMBAR SPECIAL TESTS:  Straight leg raise test: Negative, Slump test: Negative, SI Compression/distraction test: Positive, FABER test: Positive, Gaenslen's test: Negative, and Thomas test: Negative;  FABER is positive on LLE only  FUNCTIONAL TESTS:   Long sit test RLE goes long to short at malleoli;  Standing:  Lower R shoulder;  supinator, L IT was tight, R piriformis causes some back pain with stretch;  R anterior/ L posterior pelvic rotation is a possibility  Long leg traction BLE relieves her back pain considerably  GAIT: Distance walked: into clinic x 150' Assistive device utilized: None Level of assistance: Complete Independence Gait pattern: very hard heel striker with supinatory roll BLE R >L; short quick step gait pattern, minimal trunk rotation Comments:    TODAY'S TREATMENT:    Wall angels Wall rainbows Burst ER TB Kneeling trunk rotation TB Quadruped bird dog w/ TA Quadruped shoulder HABD w/ circles Sit to stand holding DB weights overhead  Self massage with back to wall w/ lacrosse ball, theracane  10/29/24 Bike L4x54min S/L hip abduction 2x10 CW/CCW circles 2lb BLE  Lat pull 20lb x 20 B Seated rows 25lb x 20 B low grips; higher grips 20lb x 20 Counter push ups 2x10 Supine chin tucks 10x5 sec; 2 sets Supine chin tuck with L shoulder CW/CCW circles 2x Supine L shoulder serratus punch x 20 Bridges on BOSU ball 2x10 Quadruped ab sets 10x5 sec Quadruped arm raises   10/26/24 THERAPEUTIC EXERCISE: To improve strength and endurance.  Demonstration, verbal and tactile cues throughout for technique. UBE L1 x 6' backward  THERAPEUTIC  ACTIVITIES: To improve functional performance.  Demonstration, verbal and tactile cues throughout for technique. Seated rowing low grip 25# x 20 BUE; high grip/high elbows for middle trap 20# x 20 BUE Seated Lat PD 25# x 20 BUE Supine cervical retraction w/ 3 sec holds x 20  Supine scapular depression w/ 3 sec holds x 20 BUE Supine scapular retraction w/ 3 sec holds x 20 BUE  Reviewed from HEP Supine shoulder ER RTB  x 20 BUE Supine shoulder HABD x 20 BUE KTOS piriformis stretch x 1' x 2 BLE  On foam roller: Shoulder ER RTB x 20 BUE Shoulder HABD RTB x 20 BUE Snow angel x 10 BUE T pec stretch x 1' BUE  MANUAL THERAPY: To promote reduced pain utilizing percussion massage with massage gun. Massage gun to L levator, middle trap, infraspinatus   10/22/24 Nustep L5x71min UE/LE Tested LE strength STM to L levator, rhomboids, UT Seated on green swiss ball: Bouncing x 1 min Ant/post x 1 min Lateral WS x 1 min Pelvic circles x both ways Marching BLE x 10  Seated LAQ BLE x 10 Alternating UE/LE raise  10/15/24 TPR to R lumbar paraspinals, QL, glute med; Foam roll IASTM as well  Seated BLE KTOS stretch 2x58min  Counter needle threads B UE x 10 Kneeling over orange pball: I,T,Y x 12 reps each Standing pallof press GTB x 20 each direction Standing side bends 10lb kettle bell B  10/12/24 THERAPEUTIC EXERCISE: To improve strength and endurance.  Demonstration, verbal and tactile cues throughout for technique. Bike L4 x 6'  THERAPEUTIC ACTIVITIES: To improve functional performance.  Demonstration, verbal and tactile cues throughout for technique. SLR hamstretch x 1' x 3 BLE Supine KTOS piriformis stretch x 1' x 3 BLE Supine hip flexor stretch x 1' x 2 BLE Seated FABER piriformis stretch x 1' BLE Prayer stretch w/ 10 sec holds x 6 Standing calf stretch on 1/2 foam roller x 1';   Soleus x 1' BLE Sidebending latissimus/QL stretch holding doorframe BUE x 20 sec x 2 each side Prone hip  extension w/ TA x 2/10 BLE Counter plank w/ TA and thoracic rotation/shoulder HABD x 10 each side Rechecked strength and ROM  09/21/24 Bike L4x23min QL/lat stretch in doorway 2x30 sec IASTM foam roll to R lats with some manual pressure in L S/L  Seated L mod pigeon + R TL rotation 2x30 sec Standing lat pull downs 7lb x 20 with cable  Standing R shoulder ext 7lb with cable x 12; x 8 with 5lb  09/17/24 THERAPEUTIC EXERCISE: To improve strength and endurance.  Demonstration, verbal and tactile cues throughout for technique. NuStep L5 x 5'  NEUROMUSCULAR RE-EDUCATION: To improve coordination, kinesthesia, posture, and proprioception. 65 cm swiss ball: Bouncing EO x 1'; EC x 1'  Marching alternately x 1'  Marching w/ alternate arm raise x 1'  Alternate knee extension x 1'  F/B rolling x 1' Supine w/ LE's on 55 cm swiss ball at 90/90  B shoulder ER RTB x 10  B shoulder HABD RTB x 10  B shoulder D2 flexion PNF x 10 each way  THERAPEUTIC ACTIVITIES: To improve functional performance.  Demonstration, verbal and tactile cues throughout for technique. Leg press 55lb x 20 BLE Standing cable rows 20lb x 20 Standing shoulder extension 20lb x 20 R lateral shift w/ RUE wall slide/reach x 10 R QL stretch in doorway x 30 sec Child's pose stretch x 10 sec x 5 Hooklying curl up and reach x 2/10 BUE  Supine = level malleoli BLE Long sit test = no change  09/14/24 Bike L3x19min Leg press 55lb x 20 BLE Standing cable rows 20lb x 20 Standing shoulder extension 20lb x 20 Sidesteps RTB at ankles 2x Standing hip abduction RTB 2x10 Standing hip extension RTB 2x10 Supine LTR both ways, DKTC 09/09/24 THERAPEUTIC EXERCISE: To improve strength and endurance.  Demonstration, verbal and tactile cues throughout for technique. Bike L4 x 6'  THERAPEUTIC ACTIVITIES: To improve functional performance.  Demonstration, verbal and tactile cues throughout for technique. Seated hamstretch x 1' x 2 BLE Supine  long sit test is only very slight long to short on the RLE;  malleoli are equal in supine Supine scapular depression BUE Supine pelvic depression w/ arms full flexed and relaxed overhead x 15 each; leg one at a time Supine L hip flexor stretch w/ SKTC on RLE x 1' w/ isometric hip extension on RLE--patient reports gets L foot numbness in this position Supine bridge w/ blue TB to knees x 20 BLE Supine SLR ham stretch by PT x  1' x 3 RLE Seated swiss ball rollouts x 10 from 11:00, 12:00, 1:00 Standing RUE wall reach w/ trunk SB stretch to the L x 1' x 2   Modified self MET for anterior R inominate w/ R SKTC w/ isometric hip ext  MANUAL THERAPY: To promote reduced pain utilizing joint mobilization. Long leg traction x 10-20 gentle grade 3-4 reps to each leg individually  MODALITIES: Static pelvic traction at neutral pull from 90/90 position at 77 lbs pull for 155 lb body weight x 5'--tolerates well, feels relief afterwards  09/07/24 NEUROMUSCULAR RE-EDUCATION: To improve coordination, kinesthesia, posture, and proprioception.  Bridges x 30 Supine hip ADD with TRA x 20 Supine hip ABD with TRA x 10 blue TB Bridges blue TB x 20 B hip flexion with ball squeeze x 12 supine MET shotgun technique for pelvic misalignment, RLE was longer after supine to long sit test Able to correct after METs Standing trunk rotation at wall Thread the needle on elevated mat table to L side  THERAPEUTIC EXERCISE: To improve strength, endurance, ROM, and flexibility.  Bike L4x28min LTR both ways x 10 B  09/02/24 NEUROMUSCULAR RE-EDUCATION: To improve coordination, kinesthesia, posture, and proprioception.  Bridge knees bent BOSU ball 10x5  THERAPEUTIC EXERCISE: To improve strength, endurance, ROM, and flexibility.  Bike L4x61min Seated HS stretch BLE x 1 min Supine BLE thomas stretch x 1 min- increased LBP so taken out of HEP Supine KTOS stretch BLE x 1 min Sidelying hip ABD x 20 BLE  MANUAL THERAPY: To  promote normalized muscle tension, improve joint mobility and/or for pain modulation  DTM to B thoracic paraspinals in prone IASTM with foam roll to thoracic paraspinals  Moist heat in supine to mid back x 8 min  08/25/24 SELF CARE: Provided education on PT POC progression., initial HEP  MODALITIES:   HP/premod ESTIM to B paraspinals from T8 to PSIS at machine default settings x 12-18 mA intensity x 15' in sitting (declined lying on back)   PATIENT EDUCATION:  Education details: adding L sidebending stretch at wall to HEP  Person educated: Patient Education method: Explanation, Demonstration, Verbal cues, Tactile cues, Handouts, and [email or web address] app access provided Education comprehension: verbalized understanding, verbal cues required, tactile cues required, and needs further education  HOME EXERCISE PROGRAM: ASSESSMENT:  Access Code: [email or web address] URL: [email or web address] Date: 10/29/2024 Prepared by: Braylin Clark  Exercises - Supine Bridge  - 1 x daily - 7 x weekly - 3 sets - 10 reps - Hooklying Abduction with Resistance  - 1 x daily - 7 x weekly - 3 sets - 10 reps - Plank with Thoracic Rotation on Counter  - 1 x daily - 7 x weekly - 3 sets - 10 reps - Supine Shoulder External Rotation on Foam Roll with Theraband  - 1 x daily - 7 x weekly - 1 sets - 10 reps - Supine Shoulder Horizontal Abduction with Resistance  - 1 x daily - 7 x weekly - 1 sets - 10 reps - Supine PNF D2 Flexion with Resistance  - 1 x daily - 7 x weekly - 1 sets - 10 reps - Curl Up with Reach  - 1 x daily - 7 x weekly - 2 sets - 10 reps - Child's Pose Stretch  - 1 x daily - 7 x weekly - 1 sets - 5 reps - 10 sec hold - Standing Quadratus Lumborum Stretch with Doorway  - 1 x daily - 7 x weekly - 1 sets - 2  reps - 1 min hold - Standing Gastroc Stretch on Step  - 1 x daily - 7 x weekly - 1 sets - 2 reps - 1 min hold - Seated Hamstring Stretch  - 1 x daily - 7 x weekly - 1 sets - 2 reps - 1 min hold -  Seated Table Hamstring Stretch  - 1 x daily - 7 x weekly - 3 sets - 10 reps - Seated Figure 4 Piriformis Stretch  - 1 x daily - 7 x weekly - 1 sets - 2 reps - 1 min hold - Supine Piriformis Stretch with Foot on Ground  - 1 x daily - 7 x weekly - 1 sets - 2 reps - Supine Chin Tuck  - 1 x daily - 7 x weekly - 3 sets - 10 reps - Standing Cervical Retraction  - 1 x daily - 7 x weekly - 3 sets - 10 reps - Quadruped Alternating Arm Lift  - 1 x daily - 7 x weekly - 3 sets - 10 reps - Quadruped Transversus Abdominis Bracing  - 1 x daily - 7 x weekly - 3 sets - 10 reps  CLINICAL IMPRESSION: Patient not reporting any LBP but more L sided neck pain and some shoulder pain. She was able to complete the interventions today without complaints. She was more challenged with the quadruped exercises d/t UE support. PT remains necessary for pain, weakness, postural deficits, and flexibility deficits.   Continue per POc  EVAL:  Laisha Rau is a 69 y.o. female who was referred to physical therapy for evaluation and treatment for LBP.   Patient reports onset of LBP pain beginning years ago. Pain is worse with rolling over in bed or twisting in any position.   Xrays showed DDD and facet OA.  She goes to the gym and works with a systems analyst.   She has very good leg strength with the exception of some L hip weakness.    However, she is very stiff and has some postural deficits.  L5 P/A glides are painful as well as rotational glides in each direction but particularly to the L.  Patient has deficits in lumbar ROM, lumbar/B LE flexibility, L hamstring, hip abductor, and extensor strength, abnormal posture, and TTP with abnormal muscle tension and pain which are interfering with ADLs and are impacting quality of life.  On Modified Oswestry patient scored 16/50 demonstrating 32% or moderate disability.  Quentin will benefit from skilled PT to address above deficits to improve mobility and activity tolerance with  decreased pain interference.     OBJECTIVE IMPAIRMENTS: difficulty walking, decreased ROM, decreased strength, impaired flexibility, postural dysfunction, and pain.   ACTIVITY LIMITATIONS: lifting, bending, standing, sleeping, and locomotion level  PARTICIPATION LIMITATIONS: cleaning, laundry, and community activity  PERSONAL FACTORS: Age, Time since onset of injury/illness/exacerbation, and 1-2 comorbidities: HTN, hyperlipidemia, osteopenia, prediabetes, CKD 2, R breast intraductal papilloma are also affecting patient's functional outcome.   REHAB POTENTIAL: Good  CLINICAL DECISION MAKING: Evolving/moderate complexity  EVALUATION COMPLEXITY: Moderate   GOALS: Goals reviewed with patient? Yes  SHORT TERM GOALS: Target date: 11/09/2024   Patient will be independent with initial HEP to improve outcomes and carryover.  Baseline: 100% PT assist required for correct completion 09/09/24:  can teach back Goal status: MET  2.  Patient will report 25% improvement in low back pain to improve QOL. Baseline: 8/10 worst 09/09/24: 2/10 today Goal status: MET-  3.  Patient will improve lumbar extension  and sidebending to 75% ROM in all planes Baseline: 09/17/24 updated Goal status: MET   LONG TERM GOALS: Target date: 12/07/2024   Patient will be independent with ongoing/advanced HEP for self-management at home.  Baseline: no advanced HEP yet 10/26/24:  HEP has been updated a lot over last several weeks, but patient is not independent with it and needs review Goal status: IN PROGRESS  2.  Patient will report 50-75% improvement in low back pain to improve QOL.  Baseline: 8/10 10/12/24:  7/10 worst last week;  1/10 today Goal status: PARTIALLY MET- 10/29/24  3.  Patient to demonstrate ability to achieve and maintain good spinal alignment/posturing and body mechanics needed for daily activities. Baseline: some R lateral lean/list 10/12/24:  improving Goal status: IN PROGRESS  4.  Patient will  demonstrate full pain free lumbar ROM to perform ADLs.   Baseline: Refer to above lumbar ROM table 09/17/24:  see updated tables above 10/12/24:  see updated tables above Goal status: IN PROGRESS  5.  Patient will demonstrate improved BLE strength to >/= 5/5 for improved stability and ease of mobility. Baseline: Refer to above LE MMT table 10/12/24:  updated tables above Goal status: IN PROGRESS- 10/22/24 improving   6. Patient will report </= 20% on Modified Oswestry (MCID = 12%) to demonstrate improved functional ability with decreased pain interference. Baseline: 32% 10/12/24:  30% Goal status: IN PROGRESS  7.  Patient will tolerate 30 min of (standing/sitting/walking) and being in any position in bed w/o increased pain to allow for  improved mobility and activity tolerance. Baseline: pain with twisting in any position, pain sitting unsupported without back support Goal status: IN PROGRESS- can walk for 30 min w/o pain,     PLAN:  PT FREQUENCY: 1-2x/week  PT DURATION: 8 weeks  PLANNED INTERVENTIONS: 97164- PT Re-evaluation, 97750- Physical Performance Testing, 97110-Therapeutic exercises, 97530- Therapeutic activity, [id number]- Neuromuscular re-education, 97535- Self Care, [id number]- Manual therapy, [id number]- Electrical stimulation (unattended), [id number]- Ultrasound, 02987- Traction (mechanical), [id number] Ionotophoresis 4mg /ml Dexamethasone , 79439 (1-2 muscles), 20561 (3+ muscles)- Dry Needling, Patient/Family education, Taping, Joint mobilization, Spinal mobilization, Cryotherapy, and Moist heat  PLAN FOR NEXT SESSION:  lots of stabilization with TA and/or PPT; quadruped activities;  lots of stretching review;   MFR/modalities to back for pain PRN  Reianna Batdorf, PT 11/04/2024, 9:21 PM  "

## 2024-11-06 ENCOUNTER — Ambulatory Visit: Admitting: Rehabilitation

## 2024-11-09 ENCOUNTER — Ambulatory Visit: Admitting: Rehabilitation

## 2024-11-12 ENCOUNTER — Ambulatory Visit

## 2024-11-16 ENCOUNTER — Ambulatory Visit: Admitting: Rehabilitation

## 2024-11-19 ENCOUNTER — Ambulatory Visit: Admitting: Rehabilitation

## 2024-11-23 ENCOUNTER — Ambulatory Visit

## 2024-11-26 ENCOUNTER — Ambulatory Visit: Admitting: Rehabilitation

## 2024-11-30 ENCOUNTER — Ambulatory Visit

## 2024-12-03 ENCOUNTER — Ambulatory Visit

## 2024-12-07 ENCOUNTER — Ambulatory Visit: Admitting: Rehabilitation

## 2024-12-10 ENCOUNTER — Ambulatory Visit

## 2024-12-14 ENCOUNTER — Ambulatory Visit: Admitting: Rehabilitation

## 2025-02-02 ENCOUNTER — Ambulatory Visit: Admitting: Internal Medicine

## 2025-08-10 ENCOUNTER — Encounter: Admitting: Internal Medicine
